# Patient Record
Sex: Male | Born: 1965
Health system: Southern US, Community
[De-identification: ages and names within clinical notes are randomized; demographics above are authoritative.]

## PROBLEM LIST (undated history)

## (undated) DIAGNOSIS — I10 Essential (primary) hypertension: Secondary | ICD-10-CM

## (undated) DIAGNOSIS — Z9989 Dependence on other enabling machines and devices: Secondary | ICD-10-CM

## (undated) DIAGNOSIS — K219 Gastro-esophageal reflux disease without esophagitis: Secondary | ICD-10-CM

## (undated) DIAGNOSIS — G4733 Obstructive sleep apnea (adult) (pediatric): Secondary | ICD-10-CM

## (undated) DIAGNOSIS — I2510 Atherosclerotic heart disease of native coronary artery without angina pectoris: Secondary | ICD-10-CM

## (undated) DIAGNOSIS — E782 Mixed hyperlipidemia: Secondary | ICD-10-CM

## (undated) DIAGNOSIS — K449 Diaphragmatic hernia without obstruction or gangrene: Secondary | ICD-10-CM

## (undated) DIAGNOSIS — R350 Frequency of micturition: Secondary | ICD-10-CM

## (undated) HISTORY — DX: Dependence on other enabling machines and devices: Z99.89

## (undated) HISTORY — PX: CIRCUMCISION: SHX1350

## (undated) HISTORY — DX: Mixed hyperlipidemia: E78.2

## (undated) HISTORY — DX: Gastro-esophageal reflux disease without esophagitis: K44.9

## (undated) HISTORY — DX: Obstructive sleep apnea (adult) (pediatric): G47.33

## (undated) HISTORY — DX: Gastro-esophageal reflux disease without esophagitis: K21.9

## (undated) HISTORY — DX: Morbid (severe) obesity due to excess calories: E66.01

---

## 2008-08-16 ENCOUNTER — Ambulatory Visit (HOSPITAL_COMMUNITY): Admission: RE | Admit: 2008-08-16 | Discharge: 2008-08-16 | Payer: Self-pay | Admitting: Internal Medicine

## 2010-06-22 ENCOUNTER — Encounter: Payer: Self-pay | Admitting: Internal Medicine

## 2011-06-12 ENCOUNTER — Encounter (INDEPENDENT_AMBULATORY_CARE_PROVIDER_SITE_OTHER): Payer: Self-pay | Admitting: *Deleted

## 2011-06-24 ENCOUNTER — Encounter (INDEPENDENT_AMBULATORY_CARE_PROVIDER_SITE_OTHER): Payer: Self-pay | Admitting: *Deleted

## 2011-06-24 ENCOUNTER — Ambulatory Visit (INDEPENDENT_AMBULATORY_CARE_PROVIDER_SITE_OTHER): Payer: BC Managed Care – PPO | Admitting: Internal Medicine

## 2011-06-24 ENCOUNTER — Encounter (INDEPENDENT_AMBULATORY_CARE_PROVIDER_SITE_OTHER): Payer: Self-pay | Admitting: Internal Medicine

## 2011-06-24 ENCOUNTER — Other Ambulatory Visit (INDEPENDENT_AMBULATORY_CARE_PROVIDER_SITE_OTHER): Payer: Self-pay | Admitting: *Deleted

## 2011-06-24 DIAGNOSIS — E1159 Type 2 diabetes mellitus with other circulatory complications: Secondary | ICD-10-CM

## 2011-06-24 DIAGNOSIS — E119 Type 2 diabetes mellitus without complications: Secondary | ICD-10-CM

## 2011-06-24 DIAGNOSIS — R1310 Dysphagia, unspecified: Secondary | ICD-10-CM

## 2011-06-24 HISTORY — DX: Type 2 diabetes mellitus with other circulatory complications: E11.59

## 2011-06-24 HISTORY — DX: Dysphagia, unspecified: R13.10

## 2011-06-24 NOTE — Patient Instructions (Signed)
Will schedule an EGD/ED with DR. [name]

## 2011-06-24 NOTE — Progress Notes (Signed)
Subjective:     [name], male   DOB: September 11, 1965, 46 y.o.   MRN: [id number]  HPI  Joel Ward is a 46 yr old male referred to our office for dysphagia. He tells me foods are lodging in his lower esophagus.  He has to drink a lot of water for the bolus to go down.  Any dry meat will lodge in his esophagus.  Symptoms x 4 years. He had an EGD/ED 12/18/2010  by Dr. 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 Small sliding hiatal hernia. [name] He is presently taking Omeprazole for acid reflux.  He says that since he started the Omeprazole his acid reflux is controlled. Appetite good. No weight loss. No abdominal pain. He has a BM about twice a day. No melena or bright red rectal bleeding. He was recently diagnosed with DM2 2 weeks ago and started on Metformin.  With starting the Metformin he had diarrhea but this is better now.    08/16/2008 DG Esophagus  IMPRESSION:  Small sliding type hiatal hernia.  Mild smooth circumferential narrowing just above the hiatal hernia.  Mild spontaneous gastroesophageal reflux to the level of the lower  thoracic esophagus.      Review of Systems see hpi Current Outpatient Prescriptions  Medication Sig Dispense Refill  . metFORMIN (GLUCOPHAGE) 500 MG tablet Take 500 mg by mouth 2 (two) times daily with a meal.      . omeprazole (PRILOSEC) 40 MG capsule Take 40 mg by mouth daily.       Past Medical History  Diagnosis Date  . Diabetes mellitus     Type 2 diagnosed 2 weeks ago   Past Surgical History  Procedure Date  . Circumcision     1988   History   Social History  . Marital Status: Married    Spouse Name: N/A    Number of Children: N/A  . Years of Education: N/A   Occupational History  . Not on file.   Social History Main Topics  . Smoking status: Never Smoker   . Smokeless tobacco: Not on file  . Alcohol Use: No  . Drug Use: No    . Sexually Active: Not on file   Other Topics Concern  . Not on file   Social History Narrative  . No narrative on file   Family Status  Relation Status Death Age  . Mother Deceased     Multiple organ failure  . Father Deceased     old age, natural causes  . Sister Other     One deceased crib death. Three alive: One has  Kidney problems, All have HTN. Two have fibromyalgia  . Brother Other     One died from sleep apnea, One died: ran over  by a policie car.  Two alive in good health.    No Known Allergies     Objective:   Physical Exam  Filed Vitals:   06/24/11 1016  Height: 6\' 1"  (1.854 m)  Weight: 301 lb 12.8 oz (136.896 kg)    Alert and oriented. Skin warm and dry. Oral mucosa is moist.   . Sclera anicteric, conjunctivae is pink. Thyroid not enlarged. No cervical lymphadenopathy. Lungs clear. Heart regular rate and rhythm.  Abdomen is soft. Bowel sounds are positive. No hepatomegaly. No abdominal masses felt. No tenderness. Obese  No edema to lower extremities. Patient is  alert and oriented.      Assessment:    Dysphagia to solids. Symptoms for 4 yrs. Prior hx of dysphagia with EGD 2 yrs ago.    Plan:     EGD/ED.  The risks and benefits such as perforation, bleeding, and infection were reviewed with the patient and is agreeable.

## 2011-06-26 ENCOUNTER — Encounter (HOSPITAL_COMMUNITY): Payer: Self-pay | Admitting: Pharmacy Technician

## 2011-07-02 ENCOUNTER — Encounter (HOSPITAL_COMMUNITY)
Admission: RE | Disposition: A | Discharge status: Home / Self Care | Payer: Self-pay | Source: Ambulatory Visit | Attending: Internal Medicine

## 2011-07-02 ENCOUNTER — Encounter (HOSPITAL_COMMUNITY): Payer: Self-pay | Admitting: *Deleted

## 2011-07-02 ENCOUNTER — Ambulatory Visit (HOSPITAL_COMMUNITY)
Admission: RE | Admit: 2011-07-02 | Discharge: 2011-07-02 | Disposition: A | Discharge status: Home / Self Care | Payer: BC Managed Care – PPO | Source: Ambulatory Visit | Attending: Internal Medicine | Admitting: Internal Medicine

## 2011-07-02 DIAGNOSIS — E119 Type 2 diabetes mellitus without complications: Secondary | ICD-10-CM | POA: Insufficient documentation

## 2011-07-02 DIAGNOSIS — K219 Gastro-esophageal reflux disease without esophagitis: Secondary | ICD-10-CM

## 2011-07-02 DIAGNOSIS — K222 Esophageal obstruction: Secondary | ICD-10-CM

## 2011-07-02 DIAGNOSIS — R1310 Dysphagia, unspecified: Secondary | ICD-10-CM

## 2011-07-02 DIAGNOSIS — K449 Diaphragmatic hernia without obstruction or gangrene: Secondary | ICD-10-CM

## 2011-07-02 LAB — GLUCOSE, CAPILLARY: Glucose-Capillary: 94 mg/dL (ref 70–99)

## 2011-07-02 SURGERY — ESOPHAGOGASTRODUODENOSCOPY (EGD) WITH ESOPHAGEAL DILATION
Anesthesia: Moderate Sedation

## 2011-07-02 MED ORDER — MEPERIDINE HCL 25 MG/ML IJ SOLN
INTRAMUSCULAR | Status: DC | PRN
  Administered 2011-07-02 (×2): 25 mg via INTRAVENOUS

## 2011-07-02 MED ORDER — BUTAMBEN-TETRACAINE-BENZOCAINE 2-2-14 % EX AERO
INHALATION_SPRAY | CUTANEOUS | Status: DC | PRN
Start: 2011-07-02 — End: 2011-07-02
  Administered 2011-07-02: 2 via TOPICAL

## 2011-07-02 MED ORDER — SIMETHICONE 40 MG/0.6ML PO SUSP
ORAL | Status: DC | PRN
  Administered 2011-07-02: 16:00:00

## 2011-07-02 MED ORDER — MEPERIDINE HCL 50 MG/ML IJ SOLN
INTRAMUSCULAR | Status: AC
  Filled 2011-07-02: qty 1

## 2011-07-02 MED ORDER — MIDAZOLAM HCL 5 MG/5ML IJ SOLN
INTRAMUSCULAR | Status: DC | PRN
  Administered 2011-07-02 (×3): 2 mg via INTRAVENOUS

## 2011-07-02 MED ORDER — MIDAZOLAM HCL 5 MG/5ML IJ SOLN
INTRAMUSCULAR | Status: AC
  Filled 2011-07-02: qty 10

## 2011-07-02 NOTE — H&P (Signed)
This is an update to history and physical from 06/24/2011. Patient has chronic GERD symptoms are controlled with PPI was not experiencing solid food dysphagia. He is undergoing diagnostic/therapeutic EGD

## 2011-07-02 NOTE — Op Note (Signed)
EGD PROCEDURE REPORT  PATIENT:  Joel Ward  MR#:  [id number] Birthdate:  [birth year], 46 y.o., male Endoscopist:  Dr. Malissa Hippo, MD Referred By:  Dr. Kirk Ruths, MD.  Procedure Date: 07/02/2011  Procedure:   EGD with ED.  Indications:  Patient is 46 year old African American male with chronic GERD who presents for solid food dysphagia.            Informed Consent:  Procedure and risks were reviewed with the patient and informed consent obtained.  Medications:  Demerol 50 mg IV Versed 6  mg IV Cetacaine spray topically for oropharyngeal anesthesia  Description of procedure:  The endoscope was introduced through the mouth and advanced to the second portion of the duodenum without difficulty or limitations. The mucosal surfaces were surveyed very carefully during advancement of the scope and upon withdrawal.  Findings:  Esophagus:  Mucosa of the proximal middle and middle third was normal. In the distal segment within 3 cm of the GE junction there were 2 small erosions. High grade Schatzki's ring noted.. GEJ:  41 cm Hiatus:  44 cm Stomach:  Stomach was empty and distended very well with insufflation. Folds in the proximal stomach were normal. Examination mucosa of body, antrum, pyloric channel, angularis, fundus and cardia was normal. Duodenum:  Normal bulbar and post bulbar mucosa.  Therapeutic/Diagnostic Maneuvers Performed:  Schatzki's ring was initially dilated with a balloon from 15-18 mm it could not be disrupted. This ring was then dilated to 20 mm with with a second balloon and disrupted. This ring was further disrupted with 3 quadrant biopsy.  Complications:  None  Impression: Erosive reflux esophagitis. High grade Schatzki's ring which was disrupted with a combination of balloon dilation and focal biopsy. Small sliding hiatal hernia.  Recommendations:  Anti-reflux measures reinforced. Continue omeprazole at 40 mg by mouth every morning. Progress  report in one week.   Delshon Blanchfield U  07/02/2011  4:42 PM  CC: Dr. Kirk Ruths, MD, MD & Dr. Bonnetta Barry ref. provider found

## 2011-11-29 ENCOUNTER — Encounter (HOSPITAL_COMMUNITY): Payer: Self-pay | Admitting: Emergency Medicine

## 2011-11-29 ENCOUNTER — Emergency Department (HOSPITAL_COMMUNITY)
Admission: EM | Admit: 2011-11-29 | Discharge: 2011-11-29 | Disposition: A | Discharge status: Home / Self Care | Payer: BC Managed Care – PPO | Attending: Emergency Medicine | Admitting: Emergency Medicine

## 2011-11-29 DIAGNOSIS — H9209 Otalgia, unspecified ear: Secondary | ICD-10-CM | POA: Insufficient documentation

## 2011-11-29 DIAGNOSIS — R42 Dizziness and giddiness: Secondary | ICD-10-CM | POA: Insufficient documentation

## 2011-11-29 DIAGNOSIS — E119 Type 2 diabetes mellitus without complications: Secondary | ICD-10-CM | POA: Insufficient documentation

## 2011-11-29 DIAGNOSIS — H6090 Unspecified otitis externa, unspecified ear: Secondary | ICD-10-CM

## 2011-11-29 DIAGNOSIS — R51 Headache: Secondary | ICD-10-CM | POA: Insufficient documentation

## 2011-11-29 MED ORDER — HYDROCODONE-ACETAMINOPHEN 5-325 MG PO TABS
2.0000 | ORAL_TABLET | Freq: Once | ORAL | Status: AC
  Administered 2011-11-29: 2 via ORAL
  Filled 2011-11-29: qty 2

## 2011-11-29 MED ORDER — HYDROCODONE-ACETAMINOPHEN 7.5-325 MG PO TABS: 1.0000 | ORAL_TABLET | ORAL | Status: AC | PRN

## 2011-11-29 MED ORDER — PSEUDOEPHEDRINE HCL 60 MG PO TABS
60.0000 mg | ORAL_TABLET | Freq: Once | ORAL | Status: AC
  Administered 2011-11-29: 60 mg via ORAL
  Filled 2011-11-29: qty 1

## 2011-11-29 MED ORDER — PENICILLIN V POTASSIUM 250 MG PO TABS
500.0000 mg | ORAL_TABLET | Freq: Once | ORAL | Status: AC
  Administered 2011-11-29: 500 mg via ORAL
  Filled 2011-11-29: qty 2

## 2011-11-29 MED ORDER — PENICILLIN V POTASSIUM 500 MG PO TABS: 500.0000 mg | ORAL_TABLET | Freq: Four times a day (QID) | ORAL | Status: AC

## 2011-11-29 MED ORDER — NEOMYCIN-POLYMYXIN-HC 3.5-10000-1 OT SOLN
3.0000 [drp] | Freq: Three times a day (TID) | OTIC | Status: DC
  Administered 2011-11-29: 3 [drp] via OTIC
  Filled 2011-11-29: qty 10

## 2011-11-29 MED ORDER — ONDANSETRON HCL 4 MG PO TABS
4.0000 mg | ORAL_TABLET | Freq: Once | ORAL | Status: AC
  Administered 2011-11-29: 4 mg via ORAL
  Filled 2011-11-29: qty 1

## 2011-11-29 NOTE — ED Notes (Signed)
Patient with c/o bilateral ear pain and headache, described as pressure and with decreased hearing in left ear. Patient report dizziness with movement, able to ambulate with steady gait.

## 2011-11-29 NOTE — ED Provider Notes (Signed)
History     CSN: [id number]  Arrival date & time 11/29/11  1032   First MD Initiated Contact with Patient 11/29/11 1124      No chief complaint on file.   (Consider location/radiation/quality/duration/timing/severity/associated sxs/prior treatment) HPI Comments: The pt c/o increasing ear pain and headache. This started about 2 to 3 days ago, and getting worse. No high fever. Some dizziness with movement. No falls. No drainaage from the ears. No previous events similar to this.  The history is provided by the patient.    Past Medical History  Diagnosis Date  . Diabetes mellitus     Type 2 diagnosed 2 weeks ago    Past Surgical History  Procedure Date  . Circumcision     1988    No family history on file.  History  Substance Use Topics  . Smoking status: Never Smoker   . Smokeless tobacco: Not on file  . Alcohol Use: No      Review of Systems  Constitutional: Negative for activity change.       All ROS Neg except as noted in HPI  HENT: Positive for ear pain. Negative for nosebleeds and neck pain.   Eyes: Negative for photophobia and discharge.  Respiratory: Negative for cough, shortness of breath and wheezing.   Cardiovascular: Negative for chest pain and palpitations.  Gastrointestinal: Negative for abdominal pain and blood in stool.  Genitourinary: Negative for dysuria, frequency and hematuria.  Musculoskeletal: Negative for back pain and arthralgias.  Skin: Negative.   Neurological: Positive for dizziness and headaches. Negative for seizures and speech difficulty.  Psychiatric/Behavioral: Negative for hallucinations and confusion.    Allergies  Shellfish allergy and Strawberry  Home Medications   Current Outpatient Rx  Name Route Sig Dispense Refill  . METFORMIN HCL 500 MG PO TABS Oral Take 500 mg by mouth 2 (two) times daily with a meal.    . OMEPRAZOLE 40 MG PO CPDR Oral Take 40 mg by mouth daily.      BP 162/93  Pulse 67  Temp 98.6 F (37 C)  (Oral)  Resp 17  Ht 5\' 11"  (1.803 m)  Wt 302 lb (136.986 kg)  BMI 42.12 kg/m2  SpO2 97%  Physical Exam  Nursing note and vitals reviewed. Constitutional: He is oriented to person, place, and time. He appears well-developed and well-nourished.  Non-toxic appearance.  HENT:  Head: Normocephalic.  Right Ear: Tympanic membrane and external ear normal.  Left Ear: Tympanic membrane and external ear normal.       There is increase redness of the right EAC. TM wnl. There is redness and swelling of the left EAC. TM wnl. Nasal congestion.  Eyes: EOM and lids are normal. Pupils are equal, round, and reactive to light.  Neck: Normal range of motion. Neck supple. Carotid bruit is not present.  Cardiovascular: Normal rate, regular rhythm, normal heart sounds, intact distal pulses and normal pulses.   Pulmonary/Chest: Breath sounds normal. No respiratory distress.  Abdominal: Soft. Bowel sounds are normal. There is no tenderness. There is no guarding.  Musculoskeletal: Normal range of motion.  Lymphadenopathy:       Head (right side): No submandibular adenopathy present.       Head (left side): No submandibular adenopathy present.    He has no cervical adenopathy.  Neurological: He is alert and oriented to person, place, and time. He has normal strength. No cranial nerve deficit or sensory deficit.  Skin: Skin is warm and dry.  Psychiatric: He  has a normal mood and affect. His speech is normal.    ED Course  Procedures (including critical care time)  Labs Reviewed - No data to display No results found.   No diagnosis found.    MDM  I have reviewed nursing notes, vital signs, and all appropriate lab and imaging results for this patient. Pt strongly advised to stop the uses of Q-Tips. Cortisporin drops given. Rx for Norco and Penicillin given. Pt to return if not improving.       Kathie Dike, [place] 12/03/11 1028

## 2011-11-29 NOTE — ED Notes (Signed)
Pt c/o ear fullness, pressure, states that the left ear is worse than the right,

## 2011-11-29 NOTE — Discharge Instructions (Signed)
Please use cortisporin otic drops to both ears three times daily. Use penicillin 2 times daily with food. Tylenol for mild pain. Norco for more severe pain.This medication may cause drowsiness.Otitis Externa Swimmer's ear (otitis externa) is an infection in the outer ear canal. It can be caused by a germ or a fungus. It may be caused by:  Swimming in dirty water.   Water that stays in the ear after swimming.  HOME CARE  Put drops in the ear canal as told by your doctor.   Only take medicine as told by your doctor.  GET HELP RIGHT AWAY IF:   You have a temperature by mouth above 102 F (38.9 C), not controlled by medicine.   There is ear pain after 3 days.  MAKE SURE YOU:   Understand these instructions.   Will watch this condition.   Will get help right away if you are not doing well or get worse.  Document Released: 11/04/2007 Document Revised: 05/07/2011 Document Reviewed: 11/04/2007 [facility] Patient Information 2012 [place], [place].

## 2011-12-03 NOTE — ED Provider Notes (Signed)
Medical screening examination/treatment/procedure(s) were performed by non-physician practitioner and as supervising physician I was immediately available for consultation/collaboration. Devoria Albe, MD, Armando Gang   Ward Givens, MD 12/03/11 [phone]

## 2012-03-15 DIAGNOSIS — S93699A Other sprain of unspecified foot, initial encounter: Secondary | ICD-10-CM

## 2012-03-15 HISTORY — DX: Other sprain of unspecified foot, initial encounter: S93.699A

## 2012-03-25 ENCOUNTER — Other Ambulatory Visit (INDEPENDENT_AMBULATORY_CARE_PROVIDER_SITE_OTHER): Payer: Self-pay | Admitting: *Deleted

## 2012-03-25 ENCOUNTER — Telehealth (INDEPENDENT_AMBULATORY_CARE_PROVIDER_SITE_OTHER): Payer: Self-pay | Admitting: *Deleted

## 2012-03-25 DIAGNOSIS — R1310 Dysphagia, unspecified: Secondary | ICD-10-CM

## 2012-03-25 NOTE — Telephone Encounter (Signed)
Joel Ward said it has been about 1 yr from the last time Joel Ward has his throat stretched. He has started getting sick when swallowing food. They went out to eat last week and Joel Ward had trouble swallowing chicken and today it  was eggs with coffee. Would like to speak with Tammy or Dr. Karilyn Cota. Didn't know if he could get his throat stretched again. The return phone number is [phone].

## 2012-03-25 NOTE — Telephone Encounter (Signed)
PCP/Requesting MD:   Name & DOB: Joel Ward 05-Dec-1965   Procedure: egd/ed  Reason/Indication:  dysphagia  Has patient had this procedure before?  yes  If so, when, by whom and where?  1/13  Is there a family history of colon cancer?    Who?  What age when diagnosed?    Is patient diabetic?   yes      Does patient have prosthetic heart valve?  no  Do you have a pacemaker?  no  Has patient had joint replacement within last 12 months?  no  Is patient on Coumadin, Plavix and/or Aspirin? no  Medications: omeprazole 40 mg daily, metformin 500 mg bid  Allergies: nkda  Medication Adjustment: none  Procedure date & time: 03/29/12 at 730

## 2012-03-25 NOTE — Telephone Encounter (Signed)
Spoke with patient. He is having trouble swallowing. Hx of esophageal stricture. Went to [employer] today and could not force an egg down.   Ann, Please schedule an EGD/ED with Dr. Karilyn Cota.

## 2012-03-25 NOTE — Telephone Encounter (Signed)
agree

## 2012-03-25 NOTE — Telephone Encounter (Signed)
Terri would you please address th is with the patient as you are the only provider on site today.\ Thank You Carlotta Telfair

## 2012-03-28 ENCOUNTER — Encounter (HOSPITAL_COMMUNITY): Payer: Self-pay

## 2012-03-28 MED ORDER — SODIUM CHLORIDE 0.45 % IV SOLN
INTRAVENOUS | Status: DC
  Administered 2012-03-29: 07:00:00 via INTRAVENOUS

## 2012-03-29 ENCOUNTER — Encounter (HOSPITAL_COMMUNITY): Payer: Self-pay | Admitting: *Deleted

## 2012-03-29 ENCOUNTER — Ambulatory Visit (HOSPITAL_COMMUNITY)
Admission: RE | Admit: 2012-03-29 | Discharge: 2012-03-29 | Disposition: A | Discharge status: Home / Self Care | Payer: BC Managed Care – PPO | Source: Ambulatory Visit | Attending: Internal Medicine | Admitting: Internal Medicine

## 2012-03-29 ENCOUNTER — Encounter (HOSPITAL_COMMUNITY)
Admission: RE | Disposition: A | Discharge status: Home / Self Care | Payer: Self-pay | Source: Ambulatory Visit | Attending: Internal Medicine

## 2012-03-29 DIAGNOSIS — K449 Diaphragmatic hernia without obstruction or gangrene: Secondary | ICD-10-CM

## 2012-03-29 DIAGNOSIS — K2960 Other gastritis without bleeding: Secondary | ICD-10-CM

## 2012-03-29 DIAGNOSIS — R12 Heartburn: Secondary | ICD-10-CM

## 2012-03-29 DIAGNOSIS — R1310 Dysphagia, unspecified: Secondary | ICD-10-CM | POA: Insufficient documentation

## 2012-03-29 DIAGNOSIS — Z01812 Encounter for preprocedural laboratory examination: Secondary | ICD-10-CM | POA: Insufficient documentation

## 2012-03-29 DIAGNOSIS — K222 Esophageal obstruction: Secondary | ICD-10-CM

## 2012-03-29 DIAGNOSIS — E119 Type 2 diabetes mellitus without complications: Secondary | ICD-10-CM | POA: Insufficient documentation

## 2012-03-29 DIAGNOSIS — K219 Gastro-esophageal reflux disease without esophagitis: Secondary | ICD-10-CM

## 2012-03-29 DIAGNOSIS — K208 Other esophagitis: Secondary | ICD-10-CM

## 2012-03-29 DIAGNOSIS — K2940 Chronic atrophic gastritis without bleeding: Secondary | ICD-10-CM | POA: Insufficient documentation

## 2012-03-29 HISTORY — DX: Frequency of micturition: R35.0

## 2012-03-29 HISTORY — PX: ESOPHAGOGASTRODUODENOSCOPY (EGD) WITH ESOPHAGEAL DILATION: SHX5812

## 2012-03-29 LAB — GLUCOSE, CAPILLARY
Glucose-Capillary: 291 mg/dL — ABNORMAL HIGH (ref 70–99)
Glucose-Capillary: 318 mg/dL — ABNORMAL HIGH (ref 70–99)

## 2012-03-29 SURGERY — ESOPHAGOGASTRODUODENOSCOPY (EGD) WITH ESOPHAGEAL DILATION
Anesthesia: Moderate Sedation

## 2012-03-29 MED ORDER — MIDAZOLAM HCL 5 MG/5ML IJ SOLN
INTRAMUSCULAR | Status: DC | PRN
  Administered 2012-03-29 (×3): 2 mg via INTRAVENOUS

## 2012-03-29 MED ORDER — MEPERIDINE HCL 50 MG/ML IJ SOLN
INTRAMUSCULAR | Status: AC
  Filled 2012-03-29: qty 1

## 2012-03-29 MED ORDER — PANTOPRAZOLE SODIUM 40 MG PO TBEC
40.0000 mg | DELAYED_RELEASE_TABLET | Freq: Two times a day (BID) | ORAL | Status: DC
Start: 2012-03-29 — End: 2015-01-21

## 2012-03-29 MED ORDER — MEPERIDINE HCL 25 MG/ML IJ SOLN
INTRAMUSCULAR | Status: DC | PRN
  Administered 2012-03-29 (×2): 25 mg via INTRAVENOUS

## 2012-03-29 MED ORDER — STERILE WATER FOR IRRIGATION IR SOLN
Status: DC | PRN
  Administered 2012-03-29: 07:00:00

## 2012-03-29 MED ORDER — BUTAMBEN-TETRACAINE-BENZOCAINE 2-2-14 % EX AERO
INHALATION_SPRAY | CUTANEOUS | Status: DC | PRN
  Administered 2012-03-29: 2 via TOPICAL

## 2012-03-29 MED ORDER — MIDAZOLAM HCL 5 MG/5ML IJ SOLN
INTRAMUSCULAR | Status: AC
  Filled 2012-03-29: qty 10

## 2012-03-29 MED ORDER — INSULIN ASPART 100 UNIT/ML ~~LOC~~ SOLN
4.0000 [IU] | Freq: Once | SUBCUTANEOUS | Status: AC
  Administered 2012-03-29: 4 [IU] via SUBCUTANEOUS
  Filled 2012-03-29: qty 3

## 2012-03-29 NOTE — H&P (Signed)
HALDON CARLEY is an 46 y.o. male.   Chief Complaint: Patient is here for EGD and ED. HPI: Patient is 46 year old African male who has chronic GERD and underwent EGD ED in January 2013. He was noted to have high grade Schatzki's ring and erosive esophagitis. He states the benefit lasted for about 2 months. He is having difficulty with solids. He says he is taking his medication for GERD and it is working well. He denies anorexia weight loss abdominal pain or melena. He was recently begun on metformin  Past Medical History  Diagnosis Date  . Diabetes mellitus     Type 2 diagnosed 2 weeks ago  . Urinary frequency     Past Surgical History  Procedure Date  . Circumcision     1988    History reviewed. No pertinent family history. Social History:  reports that he has never smoked. He does not have any smokeless tobacco history on file. He reports that he does not drink alcohol or use illicit drugs.  Allergies:  Allergies  Allergen Reactions  . Shellfish Allergy Swelling    Turns red  . Strawberry Rash    Medications Prior to Admission  Medication Sig Dispense Refill  . acetaminophen (TYLENOL) 325 MG tablet Take 650 mg by mouth every 6 (six) hours as needed. For pain      . metFORMIN (GLUCOPHAGE) 500 MG tablet Take 500 mg by mouth 2 (two) times daily with a meal.        Results for orders placed during the hospital encounter of 03/29/12 (from the past 48 hour(s))  GLUCOSE, CAPILLARY     Status: Abnormal   Collection Time   03/29/12  7:12 AM      Component Value Range Comment   Glucose-Capillary 291 (*) 70 - 99 mg/dL    No results found.  ROS  Blood pressure 138/87, pulse 87, temperature 97.8 F (36.6 C), temperature source Oral, resp. rate 13, height 5\' 11"  (1.803 m), weight 305 lb (138.347 kg), SpO2 92.00%. Physical Exam  Constitutional:       Patient is well-developed obese African American male in NAD  HENT:  Mouth/Throat: Oropharynx is clear and moist.  Eyes:  Conjunctivae normal are normal. No scleral icterus.  Neck: No thyromegaly present.  Cardiovascular: Normal rate, regular rhythm and normal heart sounds.   No murmur heard. Respiratory: Effort normal and breath sounds normal.  GI: Soft. He exhibits no distension and no mass. There is no tenderness.  Musculoskeletal: He exhibits no edema.  Lymphadenopathy:    He has no cervical adenopathy.  Neurological: He is alert.  Skin: Skin is warm and dry.     Assessment/Plan Solid food dysphagia. History of erosive esophagitis and Schatzki's ring. EGD and ED.  Gazella Anglin U 03/29/2012, 7:39 AM

## 2012-03-29 NOTE — Op Note (Signed)
EGD PROCEDURE REPORT  PATIENT:  Joel Ward  MR#:  [id number] Birthdate:  07-10-1965, 46 y.o., male Endoscopist:  Dr. Malissa Hippo, MD Referred By:  Dr. Kirk Ruths, MD Procedure Date: 03/29/2012  Procedure:   EGD with ED.  Indications:  Patient is 46 year old African male who presents with recurrent solid food dysphagia. He has chronic GERD and he states his heartburn is well controlled with therapy. On his last exam in generally 2013 had 2 esophageal erosions and high grade distal is aphasia ring which was dilated to 20 mm with a balloon and also disrupted with focal biopsy.            Informed Consent:  The risks, benefits, alternatives & imponderables which include, but are not limited to, bleeding, infection, perforation, drug reaction and potential missed lesion have been reviewed.  The potential for biopsy, lesion removal, esophageal dilation, etc. have also been discussed.  Questions have been answered.  All parties agreeable.  Please see history & physical in medical record for more information.  Medications:  Demerol 50 mg IV Versed 6 mg IV Cetacaine spray topically for oropharyngeal anesthesia  Description of procedure:  The endoscope was introduced through the mouth and advanced to the second portion of the duodenum without difficulty or limitations. The mucosal surfaces were surveyed very carefully during advancement of the scope and upon withdrawal.  Findings:  Esophagus:  Mucosa of the proximal and middle third was normal. There were erosions in 2 ulcers involving distal 4-5 cm. Stricture noted at GE junction. GEJ:  39 cm Hiatus:  42 cm Stomach:  Stomach was empty and distended very well with insufflation. Folds in the proximal stomach were normal. Examination of mucosa at body revealed some patchy mucosal edema and erythema at antrum. Pyloric channel was patent. Angularis fundus and cardia were examined by retroflexing the scope and were normal. Duodenum:   Normal bulbar and post bulbar mucosa.  Therapeutic/Diagnostic Maneuvers Performed:   Stricture was dilated using balloon dilator. Balloon dilator was passed with the scope. Guidewire was pushed into gastric lumen. Balloon dilator was positioned across stricture and insufflated to a diameter of 18 mm and subsequently to 19 mm resulting in mucosal disruption. Balloon dilator was withdrawn. I was able to pass the scope across stricture without any resistance.  Complications:  None  Impression: Erosive/ulcerative reflux esophagitis worse than on previous exam of January 2013. Stricture at GE junction was dilated to 19 mm with a balloon. Small sliding heart hernia. Nonerosive gastritis.  Recommendations:  Anti-reflux measures reinforced. Pantoprazole 40 mg by mouth twice a day. H. pylori serology. Office visit in 3 months.   Dashay Giesler U  03/29/2012  8:08 AM  CC: Dr. Kirk Ruths, MD & Dr. Bonnetta Barry ref. provider found

## 2012-03-31 ENCOUNTER — Encounter (HOSPITAL_COMMUNITY): Payer: Self-pay | Admitting: Internal Medicine

## 2012-06-28 ENCOUNTER — Ambulatory Visit (INDEPENDENT_AMBULATORY_CARE_PROVIDER_SITE_OTHER): Payer: BC Managed Care – PPO | Admitting: Internal Medicine

## 2012-08-02 ENCOUNTER — Ambulatory Visit (INDEPENDENT_AMBULATORY_CARE_PROVIDER_SITE_OTHER): Payer: BC Managed Care – PPO | Admitting: Internal Medicine

## 2012-08-08 ENCOUNTER — Ambulatory Visit (INDEPENDENT_AMBULATORY_CARE_PROVIDER_SITE_OTHER): Payer: BC Managed Care – PPO | Admitting: Internal Medicine

## 2012-08-26 DIAGNOSIS — M79673 Pain in unspecified foot: Secondary | ICD-10-CM | POA: Insufficient documentation

## 2014-12-20 ENCOUNTER — Ambulatory Visit (HOSPITAL_COMMUNITY)
Admission: RE | Admit: 2014-12-20 | Discharge: 2014-12-20 | Disposition: A | Discharge status: Home / Self Care | Payer: BLUE CROSS/BLUE SHIELD | Source: Ambulatory Visit | Attending: Physician Assistant | Admitting: Physician Assistant

## 2014-12-20 ENCOUNTER — Other Ambulatory Visit (HOSPITAL_COMMUNITY): Payer: Self-pay | Admitting: Physician Assistant

## 2014-12-20 DIAGNOSIS — M25561 Pain in right knee: Secondary | ICD-10-CM | POA: Diagnosis not present

## 2015-01-17 ENCOUNTER — Encounter (INDEPENDENT_AMBULATORY_CARE_PROVIDER_SITE_OTHER): Payer: Self-pay | Admitting: *Deleted

## 2015-01-21 ENCOUNTER — Encounter (INDEPENDENT_AMBULATORY_CARE_PROVIDER_SITE_OTHER): Payer: Self-pay | Admitting: Internal Medicine

## 2015-01-21 ENCOUNTER — Encounter (INDEPENDENT_AMBULATORY_CARE_PROVIDER_SITE_OTHER): Payer: Self-pay | Admitting: *Deleted

## 2015-01-21 ENCOUNTER — Other Ambulatory Visit (INDEPENDENT_AMBULATORY_CARE_PROVIDER_SITE_OTHER): Payer: Self-pay | Admitting: *Deleted

## 2015-01-21 ENCOUNTER — Ambulatory Visit (INDEPENDENT_AMBULATORY_CARE_PROVIDER_SITE_OTHER): Payer: BLUE CROSS/BLUE SHIELD | Admitting: Internal Medicine

## 2015-01-21 VITALS — BP 132/68 | HR 64 | Temp 97.4°F | Ht 71.0 in | Wt 282.9 lb

## 2015-01-21 DIAGNOSIS — R1314 Dysphagia, pharyngoesophageal phase: Secondary | ICD-10-CM | POA: Diagnosis not present

## 2015-01-21 DIAGNOSIS — R1310 Dysphagia, unspecified: Secondary | ICD-10-CM

## 2015-01-21 MED ORDER — PANTOPRAZOLE SODIUM 40 MG PO TBEC: 40.0000 mg | DELAYED_RELEASE_TABLET | Freq: Two times a day (BID) | ORAL | Status: DC

## 2015-01-21 NOTE — Progress Notes (Addendum)
   Subjective:    [name], male    DOB: [birth year], 49 y.o.   MRN: [id number]  HPI  Presents today with c/o dysphagia. Symptoms x 2 yrs. Underwent 2 EGDs in 2013 for same. He tells me [place] [employer], beef, bread foods will lodge. He has not had any problems in the past 30 days because he is on a liquid diet to get his BS under control. He has lost 12 pounds in the past 30 days. Appetite is good.   No abdominal pain. No GERD. Not taking a PPI. He usually has BM once a day. No melena or BRRB.     03/29/2012: EGD with ED.  Indications: Patient is 49 year old African male who presents with recurrent solid food dysphagia. He has chronic GERD and he states his heartburn is well controlled with therapy. On his last exam in generally 2013 had 2 esophageal erosions and high grade distal is aphasia ring which was dilated to 20 mm with a balloon and also disrupted with focal biopsy.  Impression: Erosive/ulcerative reflux esophagitis worse than on previous exam of January 2013. Stricture at GE junction was dilated to 19 mm with a balloon. Small sliding heart hernia. Nonerosive gastritis. H. Pylori negative.  Review of Systems Past Medical History  Diagnosis Date  . Diabetes mellitus     Type 2 diagnosed 2 weeks ago  . Urinary frequency   . Morbid obesity   . Mixed hyperlipidemia   . OSA on CPAP   . Hiatal hernia with gastroesophageal reflux     Past Surgical History  Procedure Laterality Date  . Circumcision      1988  . Esophagogastroduodenoscopy (egd) with esophageal dilation  03/29/2012    Procedure: ESOPHAGOGASTRODUODENOSCOPY (EGD) WITH ESOPHAGEAL DILATION;  Surgeon: Malissa Hippo, MD;  Location: [facility];  Service: Endoscopy;  Laterality: N/A;  730    Allergies  Allergen Reactions  . Shellfish Allergy Swelling    Turns red  . Strawberry  Rash    Current Outpatient Prescriptions on File Prior to Visit  Medication Sig Dispense Refill  . acetaminophen (TYLENOL) 325 MG tablet Take 650 mg by mouth every 6 (six) hours as needed. For pain    . linagliptin (TRADJENTA) 5 MG TABS tablet Take 5 mg by mouth daily.    [name] lisinopril (PRINIVIL,ZESTRIL) 2.5 MG tablet Take 2.5 mg by mouth daily.     No current facility-administered medications on file prior to visit.        Objective:   Physical Exam Blood pressure 132/68, pulse 64, temperature 97.4 F (36.3 C), height  (1.803 m), weight 282 lb 14.4 oz (128.323 kg). Alert and oriented. Skin warm and dry. Oral mucosa is moist.   . Sclera anicteric, conjunctivae is pink. Thyroid not enlarged. No cervical lymphadenopathy. Lungs clear. Heart regular rate and rhythm.  Abdomen is soft. Bowel sounds are positive. No hepatomegaly. No abdominal masses felt. No tenderness.  No edema to lower extremities.         Assessment & Plan:  Solid food dysphagia. Stricture needs to be ruled out. EGD/ED. The risks and benefits such as perforation, bleeding, and infection were reviewed with the patient and is agreeable. Rx for Protonix  BID sent to his pharmacy.

## 2015-01-21 NOTE — Patient Instructions (Signed)
EGD/ED. The risks and benefits such as perforation, bleeding, and infection were reviewed with the patient and is agreeable. 

## 2015-01-22 ENCOUNTER — Encounter: Payer: Self-pay | Admitting: Neurology

## 2015-01-22 ENCOUNTER — Ambulatory Visit (INDEPENDENT_AMBULATORY_CARE_PROVIDER_SITE_OTHER): Payer: BLUE CROSS/BLUE SHIELD | Admitting: Neurology

## 2015-01-22 DIAGNOSIS — R0683 Snoring: Secondary | ICD-10-CM | POA: Diagnosis not present

## 2015-01-22 DIAGNOSIS — R351 Nocturia: Secondary | ICD-10-CM

## 2015-01-22 DIAGNOSIS — G4726 Circadian rhythm sleep disorder, shift work type: Secondary | ICD-10-CM

## 2015-01-22 DIAGNOSIS — G4710 Hypersomnia, unspecified: Secondary | ICD-10-CM | POA: Diagnosis not present

## 2015-01-22 DIAGNOSIS — G4730 Sleep apnea, unspecified: Secondary | ICD-10-CM | POA: Diagnosis not present

## 2015-01-22 DIAGNOSIS — Z6836 Body mass index (BMI) 36.0-36.9, adult: Secondary | ICD-10-CM

## 2015-01-22 HISTORY — DX: Circadian rhythm sleep disorder, shift work type: G47.26

## 2015-01-22 HISTORY — DX: Snoring: R06.83

## 2015-01-22 HISTORY — DX: Nocturia: R35.1

## 2015-01-22 MED ORDER — MOMETASONE FUROATE 50 MCG/ACT NA SUSP: 2.0000 | Freq: Every day | NASAL | Status: DC

## 2015-01-22 NOTE — Patient Instructions (Signed)
Place sleep apnea patient instructions here.   Polysomnography (Sleep Studies) Polysomnography (PSG) is a series of tests used for detecting (diagnosing) obstructive sleep apnea and other sleep disorders. The tests measure how some parts of your body are working while you are sleeping. The tests are extensive and expensive. They are done in a sleep lab or hospital, and vary from center to center. Your caregiver may perform other more simple sleep studies and questionnaires before doing more complete and involved testing. Testing may not be covered by insurance. Some of these tests are:  An EEG (Electroencephalogram). This tests your brain waves and stages of sleep.  An EOG (Electrooculogram). This measures the movements of your eyes. It detects periods of REM (rapid eye movement) sleep, which is your dream sleep.  An EKG (Electrocardiogram). This measures your heart rhythm.  EMG (Electromyography). This is a measurement of how the muscles are working in your upper airway and your legs while sleeping.  An oximetry measurement. It measures how much oxygen (air) you are getting while sleeping.  Breathing efforts may be measured. The same test can be interpreted (understood) differently by different caregivers and centers that study sleep.  Studies may be given an apnea/hypopnea index (AHI). This is a number which is found by counting the times of no breathing or under breathing during the night, and relating those numbers to the amount of time spent in bed. When the AHI is greater than 15, the patient is likely to complain of daytime sleepiness. When the AHI is greater than 30, the patient is at increased risk for heart problems and must be followed more closely. Following the AHI also allows you to know how treatment is working. Simple oximetry (tracking the amount of oxygen that is taken in) can be used for screening patients who:  Do not have symptoms (problems) of OSA.  Have a normal Epworth  Sleepiness Scale Score.  Have a low pre-test probability of having OSA.  Have none of the upper airway problems likely to cause apnea.  Oximetry is also used to determine if treatment is effective in patients who showed significant desaturations (not getting enough oxygen) on their home sleep study. One extra measure of safety is to perform additional studies for the person who only snores. This is because no one can predict with absolute certainty who will have OSA. Those who show significant desaturations (not getting enough oxygen) are recommended to have a more detailed sleep study. Document Released: 11/22/2002 Document Revised: 08/10/2011 Document Reviewed: 07/24/2013 [facility] Patient Information 2015 [place], [place]. This information is not intended to replace advice given to you by your health care provider. Make sure you discuss any questions you have with your health care provider.

## 2015-01-22 NOTE — Progress Notes (Signed)
SLEEP MEDICINE CLINIC   Provider:  Melvyn Novas, M D  Referring Provider: Samuella Bruin Primary Care Physician:  Lenise Herald, PA-C  Chief Complaint  Patient presents with  . sleep consult    has had a sleep study 5-10 years ago but doesn't have results, on cpap but hasn't been using it, DME is [employer], rm [address], son    HPI:  Joel Ward is a 49 y.o. male , seen here as a referral  from PA Lenise Herald for a sleep evaluation,  Chief complaint according to patient : I couldn't use my CPAP- prescribed 10 years ago [facility] "   Joel Ward is a right-handed African-American gentleman who presents with a long established diagnosis of obstructive sleep apnea but was at the time not able to tolerate CPAP. Now he presents again with the knowledge of apneas been witnessed of loud snoring and he has in the meantime developed diabetes. His body mass index has increased, as he gained about 20 pounds in the meantime. He is currently treated for hypertension, hyperlipidemia and diabetes. He reports that his wife and his children have all commented on his snoring. 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 He has no daylight exposure since he works at night. 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 His work place is not air conditioned, and he works sometimes in 90 degrees with high humidity.  He returns from work at about 7 AM. He will take a shower and about one hour after arriving at home get to bed; the bedroom as core, quiet and dark. Sleep habits are as follows: He will sleep fragmented, interrupted by 3-4 bathroom breaks and by waking himself up snoring or choking. By about 4 PM he will rise again, he will have about 6 hours of sleep or less. He prefers to sleep on the side and usually when waking up is still in the lateral  position. He is sleeping with up to 6 pillows, keeping his chest elevated.   Sleep medical history and family sleep history: The patient's mother snored loudly and had crescendo snoring. The patient has no history of neck surgery , facial trauma or surgery , he does have a Barrett's esophagus and occasionally has the esophagus stretched. He has perennial rhinitis.  Social history: The patient is a nonsmoker, nondrinker, he takes a lot of 5 hour energy shots ( 2 ). Rarely soda or iced tea or coffee.  Review of Systems: Out of a complete 14 system review, the patient complains of only the following symptoms, and all other reviewed systems are negative. The patient has fallen asleep driving. snoring and apnea being witnessed. Weight gain,   Epworth score 11 , Fatigue severity score 28  , depression score 0.    Social History   Social History  . Marital Status: Married    Spouse Name: N/A  . Number of Children: N/A  . Years of Education: N/A   Occupational History  . Not on file.   Social History Main Topics  . Smoking status: Never Smoker   . Smokeless tobacco: Not on file  . Alcohol Use: No  . Drug Use: No  . Sexual Activity: Not on file   Other Topics Concern  . Not on file   Social History  Narrative   Drinks caffeine 4 times a week.      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    Family History  Problem Relation Age of Onset  . Heart disease Mother   . Kidney failure Mother   . Diabetes Mother   . Diabetes Father     Past Medical History  Diagnosis Date  . Diabetes mellitus     Type 2 diagnosed 2 weeks ago  . Urinary frequency   . Morbid obesity   . Mixed hyperlipidemia   . OSA on CPAP   . Hiatal hernia with gastroesophageal reflux     Past Surgical History  Procedure Laterality Date  . Circumcision      1988  . Esophagogastroduodenoscopy (egd) with esophageal dilation  03/29/2012    Procedure: ESOPHAGOGASTRODUODENOSCOPY (EGD) WITH ESOPHAGEAL DILATION;  Surgeon:  Malissa Hippo, MD;  [facility];  Service: Endoscopy;  Laterality: N/A;  730    Current Outpatient Prescriptions  Medication Sig Dispense Refill  . acetaminophen (TYLENOL) 325 MG tablet Take 650 mg by mouth every 6 (six) hours as needed. For pain    . linagliptin (TRADJENTA) 5 MG TABS tablet Take 5 mg by mouth daily.    [name] lisinopril (PRINIVIL,ZESTRIL) 2.5 MG tablet Take 2.5 mg by mouth daily.    . pantoprazole (PROTONIX) 40 MG tablet Take 1 tablet (40 mg total) by mouth 2 (two) times daily before a meal. 60 tablet 3   No current facility-administered medications for this visit.    Allergies as of 01/22/2015 - Review Complete 01/22/2015  Allergen Reaction Noted  . Shellfish allergy Swelling 11/29/2011  . Strawberry Rash 11/29/2011    Vitals: BP 140/90 mmHg  Pulse 86  Resp 20  Ht 5\' 11"  (1.803 m)  Wt 283 lb (128.368 kg)  BMI 39.49 kg/m2 Last Weight:  Wt Readings from Last 1 Encounters:  01/22/15 283 lb (128.368 kg)   ZOX:WRUE mass index is 39.49 kg/(m^2).     Last Height:   Ht Readings from Last 1 Encounters:  01/22/15 5\' 11"  (1.803 m)    Physical exam:  General: The patient is awake, alert and appears not in acute distress. The patient is well groomed. Head: Normocephalic, atraumatic. Neck is supple. Mallampati  4,  neck circumference:20.25 . Nasal airflow restricted, no septal deviation. , TMJ is not  evident . Retrognathia is seen.  Cardiovascular:  Regular rate and rhythm, without  murmurs or carotid bruit, and without distended neck veins. Respiratory: Lungs are clear to auscultation. Skin:  Without evidence of edema, or rash Trunk: BMI is elevated . The patient's posture is erect .   Neurologic exam : The patient is awake and alert, oriented to place and time.   Memory subjective described as impaired when sleepy . Attention span & concentration ability appears normal.  Speech is fluent,  without dysarthria, dysphonia or aphasia.  Mood and affect are  appropriate.  Cranial nerves: Pupils are equal and briskly reactive to light. Funduscopic exam without evidence of pallor or edema. Extraocular movements in vertical and horizontal planes intact and without nystagmus. Visual fields by finger perimetry are intact. Hearing to finger rub intact. Facial sensation intact to fine touch.Facial motor strength is symmetric and tongue and uvula move midline. Shoulder shrug was symmetrical.   Motor exam: Normal tone. Elevated muscle bulk and symmetric strength in all extremities.  Sensory:  Fine touch, pinprick and vibration were tested in all extremities.  Proprioception tested in the upper extremities was normal.  Coordination: Rapid alternating movements in the fingers/hands was normal.  Finger-to-nose maneuver  normal without evidence of ataxia, dysmetria or tremor.  Gait and station: Patient walks without assistive device and is able unassisted to climb up to the exam table. Strength within normal limits.  Stance is stable and normal.  Toe and hell stand were tested .Tandem gait is unfragmented. Turns with  3 Steps.  Deep tendon reflexes: in the  upper and lower extremities are symmetric and intact. His right knee is swollen .Babinski maneuver response is downgoing.  The patient was advised of the nature of the diagnosed sleep disorder , the treatment options and risks for general a health and wellness arising from not treating the condition.  I spent more than 30 minutes of face to face time with the patient. Greater than 50% of time was spent in counseling and coordination of care. We have discussed the diagnosis and differential and I answered the patient's questions.     Assessment:  After physical and neurologic examination, review of laboratory studies,  Personal review of imaging studies, reports of other /same  Imaging studies ,  Results of polysomnography/ neurophysiology testing and pre-existing records as far as provided in visit., my  assessment is   1) Joel Ward is indeed a patient with a high risk for obstructive sleep apnea. This is defined by his body mass index, his larger neck circumference and his Mallampati. I have not been able to locate his previous sleep study from a decade ago, but I will need to reestablish his current degree of apnea. If his apnea is mild and without associated oxygen desaturation he could be a candidate for a dental device. This would advance the mandibula forward thereby creating space on the back of the throat. These devices work very well for snoring. They do not work well for patients that have oxygen desaturations with apnea. I will order a sleep study for him, by [facility] criteria.  2) the second sleep disorder is indeed his shift work. Most shift workers are not able to sleep 7 or 8 hours in daytime as a word in a normal nocturnal sleep pattern. This leads 2, relative sleep deprivation and also causes excessive daytime sleepiness and fatigue. Shift work also contributes to weight gain, diabetes and hypertension.  3) elevated body mass index. Joel Ward is muscular but he does have an elevated body mass index, and this contributes as a risk factor to his diabetes, hypertension, and obstructive sleep apnea. He works in a physically demanding job and needs to hydrate with electrolytes and water. A modified diet with unsaturated fatty acids and complex carbohydrates could help him to not lose muscle mass but weight.    Plan:  Treatment plan and additional workup :  I ordered a SPLIt night poslysomnography at 3% and AHI 15.  He used to wake up wit headaches, please add Co2.   Rv after Sleep study.    Porfirio Mylar Analeah Brame MD  01/22/2015   CC: Shawnie Dapper, Pa-c [address] [place], [place] [address]

## 2015-02-08 ENCOUNTER — Encounter (HOSPITAL_COMMUNITY)
Admission: RE | Disposition: A | Discharge status: Home / Self Care | Payer: Self-pay | Source: Ambulatory Visit | Attending: Internal Medicine

## 2015-02-08 ENCOUNTER — Encounter (HOSPITAL_COMMUNITY): Payer: Self-pay | Admitting: *Deleted

## 2015-02-08 ENCOUNTER — Ambulatory Visit (HOSPITAL_COMMUNITY)
Admission: RE | Admit: 2015-02-08 | Discharge: 2015-02-08 | Disposition: A | Discharge status: Home / Self Care | Payer: BLUE CROSS/BLUE SHIELD | Source: Ambulatory Visit | Attending: Internal Medicine | Admitting: Internal Medicine

## 2015-02-08 DIAGNOSIS — E119 Type 2 diabetes mellitus without complications: Secondary | ICD-10-CM | POA: Insufficient documentation

## 2015-02-08 DIAGNOSIS — Z79899 Other long term (current) drug therapy: Secondary | ICD-10-CM | POA: Diagnosis not present

## 2015-02-08 DIAGNOSIS — K449 Diaphragmatic hernia without obstruction or gangrene: Secondary | ICD-10-CM | POA: Diagnosis not present

## 2015-02-08 DIAGNOSIS — I10 Essential (primary) hypertension: Secondary | ICD-10-CM | POA: Diagnosis not present

## 2015-02-08 DIAGNOSIS — Z6838 Body mass index (BMI) 38.0-38.9, adult: Secondary | ICD-10-CM | POA: Insufficient documentation

## 2015-02-08 DIAGNOSIS — K2210 Ulcer of esophagus without bleeding: Secondary | ICD-10-CM | POA: Insufficient documentation

## 2015-02-08 DIAGNOSIS — K219 Gastro-esophageal reflux disease without esophagitis: Secondary | ICD-10-CM | POA: Insufficient documentation

## 2015-02-08 DIAGNOSIS — K208 Other esophagitis: Secondary | ICD-10-CM | POA: Diagnosis not present

## 2015-02-08 DIAGNOSIS — K222 Esophageal obstruction: Secondary | ICD-10-CM | POA: Insufficient documentation

## 2015-02-08 DIAGNOSIS — R1314 Dysphagia, pharyngoesophageal phase: Secondary | ICD-10-CM | POA: Diagnosis not present

## 2015-02-08 DIAGNOSIS — E782 Mixed hyperlipidemia: Secondary | ICD-10-CM | POA: Insufficient documentation

## 2015-02-08 DIAGNOSIS — R1310 Dysphagia, unspecified: Secondary | ICD-10-CM | POA: Insufficient documentation

## 2015-02-08 DIAGNOSIS — G4733 Obstructive sleep apnea (adult) (pediatric): Secondary | ICD-10-CM | POA: Insufficient documentation

## 2015-02-08 HISTORY — PX: ESOPHAGOGASTRODUODENOSCOPY: SHX5428

## 2015-02-08 HISTORY — PX: ESOPHAGEAL DILATION: SHX303

## 2015-02-08 HISTORY — DX: Essential (primary) hypertension: I10

## 2015-02-08 LAB — GLUCOSE, CAPILLARY: GLUCOSE-CAPILLARY: 101 mg/dL — AB (ref 65–99)

## 2015-02-08 SURGERY — EGD (ESOPHAGOGASTRODUODENOSCOPY)
Anesthesia: Moderate Sedation

## 2015-02-08 MED ORDER — MIDAZOLAM HCL 5 MG/5ML IJ SOLN
INTRAMUSCULAR | Status: AC
  Filled 2015-02-08: qty 10

## 2015-02-08 MED ORDER — BUTAMBEN-TETRACAINE-BENZOCAINE 2-2-14 % EX AERO
INHALATION_SPRAY | CUTANEOUS | Status: DC | PRN
  Administered 2015-02-08: 1 via TOPICAL

## 2015-02-08 MED ORDER — MIDAZOLAM HCL 5 MG/5ML IJ SOLN
INTRAMUSCULAR | Status: DC | PRN
  Administered 2015-02-08: 3 mg via INTRAVENOUS
  Administered 2015-02-08: 2 mg via INTRAVENOUS

## 2015-02-08 MED ORDER — SODIUM CHLORIDE 0.9 % IV SOLN
INTRAVENOUS | Status: DC
  Administered 2015-02-08: 10:00:00 via INTRAVENOUS

## 2015-02-08 MED ORDER — MEPERIDINE HCL 50 MG/ML IJ SOLN
INTRAMUSCULAR | Status: DC | PRN
  Administered 2015-02-08 (×2): 25 mg via INTRAVENOUS

## 2015-02-08 MED ORDER — MEPERIDINE HCL 50 MG/ML IJ SOLN
INTRAMUSCULAR | Status: AC
  Filled 2015-02-08: qty 1

## 2015-02-08 NOTE — Discharge Instructions (Signed)
Resume usual medications and diet. Remember to chew food thoroughly and eat slowly. No driving for 24 hours OV in 3 months.    Esophagogastroduodenoscopy Care After Refer to this sheet in the next few weeks. These instructions provide you with information on caring for yourself after your procedure. Your caregiver may also give you more specific instructions. Your treatment has been planned according to current medical practices, but problems sometimes occur. Call your caregiver if you have any problems or questions after your procedure.  HOME CARE INSTRUCTIONS  Do not eat or drink anything until the numbing medicine (local anesthetic) has worn off and your gag reflex has returned. You will know that the local anesthetic has worn off when you can swallow comfortably.  Do not drive for 12 hours after the procedure or as directed by your caregiver.  Only take medicines as directed by your caregiver. SEEK MEDICAL CARE IF:   You cannot stop coughing.  You are not urinating at all or less than usual. SEEK IMMEDIATE MEDICAL CARE IF:  You have difficulty swallowing.  You cannot eat or drink.  You have worsening throat or chest pain.  You have dizziness, lightheadedness, or you faint.  You have nausea or vomiting.  You have chills.  You have a fever.  You have severe abdominal pain.  You have black, tarry, or bloody stools. Document Released: 05/04/2012 Document Reviewed: 05/04/2012 [facility] Patient Information 2015 [place], [place]. This information is not intended to replace advice given to you by your health care provider. Make sure you discuss any questions you have with your health care provider.   Peptic Ulcer A peptic ulcer is a sore in the lining of your esophagus (esophageal ulcer), stomach (gastric ulcer), or in the first part of your small intestine (duodenal ulcer). The ulcer causes erosion into the deeper tissue. CAUSES  Normally, the lining of the stomach and the  small intestine protects itself from the acid that digests food. The protective lining can be damaged by:  An infection caused by a bacterium called Helicobacter pylori (H. pylori).  Regular use of nonsteroidal anti-inflammatory drugs (NSAIDs), such as ibuprofen or aspirin.  Smoking tobacco. Other risk factors include being older than 50, drinking alcohol excessively, and having a family history of ulcer disease.  SYMPTOMS   Burning pain or gnawing in the area between the chest and the belly button.  Heartburn.  Nausea and vomiting.  Bloating. The pain can be worse on an empty stomach and at night. If the ulcer results in bleeding, it can cause:  Black, tarry stools.  Vomiting of bright red blood.  Vomiting of coffee-ground-looking materials. DIAGNOSIS  A diagnosis is usually made based upon your history and an exam. Other tests and procedures may be performed to find the cause of the ulcer. Finding a cause will help determine the best treatment. Tests and procedures may include:  Blood tests, stool tests, or breath tests to check for the bacterium H. pylori.  An upper gastrointestinal (GI) series of the esophagus, stomach, and small intestine.  An endoscopy to examine the esophagus, stomach, and small intestine.  A biopsy. TREATMENT  Treatment may include:  Eliminating the cause of the ulcer, such as smoking, NSAIDs, or alcohol.  Medicines to reduce the amount of acid in your digestive tract.  Antibiotic medicines if the ulcer is caused by the H. pylori bacterium.  An upper endoscopy to treat a bleeding ulcer.  Surgery if the bleeding is severe or if the ulcer created  a hole somewhere in the digestive system. HOME CARE INSTRUCTIONS   Avoid tobacco, alcohol, and caffeine. Smoking can increase the acid in the stomach, and continued smoking will impair the healing of ulcers.  Avoid foods and drinks that seem to cause discomfort or aggravate your ulcer.  Only take  medicines as directed by your caregiver. Do not substitute over-the-counter medicines for prescription medicines without talking to your caregiver.  Keep any follow-up appointments and tests as directed. SEEK MEDICAL CARE IF:   Your do not improve within 7 days of starting treatment.  You have ongoing indigestion or heartburn. SEEK IMMEDIATE MEDICAL CARE IF:   You have sudden, sharp, or persistent abdominal pain.  You have bloody or dark black, tarry stools.  You vomit blood or vomit that looks like coffee grounds.  You become light-headed, weak, or feel faint.  You become sweaty or clammy. MAKE SURE YOU:   Understand these instructions.  Will watch your condition.  Will get help right away if you are not doing well or get worse. Document Released: 05/15/2000 Document Revised: 10/02/2013 Document Reviewed: 12/16/2011 [facility] Patient Information 2015 [place], [place]. This information is not intended to replace advice given to you by your health care provider. Make sure you discuss any questions you have with your health care provider.

## 2015-02-08 NOTE — Op Note (Signed)
EGD PROCEDURE REPORT  PATIENT:  Joel Ward  MR#:  [id number] Birthdate:  23-Aug-1965, 49 y.o., male Endoscopist:  Dr. Malissa Hippo, MD  Procedure Date: 02/08/2015  Procedure:   EGD with ED  Indications:  Patient is 49 year old African-American male was chronic GERD complicated by distal esophageal stricture who presents with solid food dysphagia. Patient stopped PPI while back for cost reasons.            Informed Consent:  The risks, benefits, alternatives & imponderables which include, but are not limited to, bleeding, infection, perforation, drug reaction and potential missed lesion have been reviewed.  The potential for biopsy, lesion removal, esophageal dilation, etc. have also been discussed.  Questions have been answered.  All parties agreeable.  Please see history & physical in medical record for more information.  Medications:  Demerol 50 mg IV Versed 5 mg IV Cetacaine spray topically for oropharyngeal anesthesia  Description of procedure:  The endoscope was introduced through the mouth and advanced to the second portion of the duodenum without difficulty or limitations. The mucosal surfaces were surveyed very carefully during advancement of the scope and upon withdrawal.  Findings:  Esophagus:  The cause of the esophagus was normal. 2 ulcers noted at GE junction along with stricture. Scope passed across the stricture without any difficulty. GEJ:  41 cm Hiatus:  44 cm Stomach:  Stomach was empty and distended very well with insufflation. Folds in the proximal stomach were normal. Examination of mucosa at gastric body, antrum, pyloric channel, angularis fundus and cardia was normal. Duodenum:  Normal bulbar and post bulbar mucosa.  Therapeutic/Diagnostic Maneuvers Performed:   Stricture at GE junction was dilated with balloon dilator. Balloon dilator was advanced through the scope and guidewire push in the gastric lumen. Balloon dilator was positioned across the stricture  and insufflated to a diameter of 15 mm but no mucosal disruption noted. The stricture was further dilated to 16.5 and then to 18 mm resulting in disruption at 2two sites.   Complications:  None  EBL: None  Impression: Erosive/ulcerative esophagitis with stricture at GE junction. This stricture was dilated with balloon dilator from 15-18 mm.  Recommendations:  Tablet instructions given. Anti-reflux measures reinforced. Patient advised to stay on pantoprazole 40 mg by mouth twice a day. Office visit in 3 months.  Rashonda Warrior U  02/08/2015  11:53 AM  CC: Dr. Loreta Ave, Sharlet Salina, PA-C & Dr. Bonnetta Barry ref. provider found

## 2015-02-08 NOTE — H&P (Signed)
Joel Ward is an 49 y.o. male.   Chief Complaint: Patient is here for EGD and ED. HPI: This 49 year old African-American male with history of GERD and esophageal stricture who presents with recurrent solid food dysphagia. Last dilation was in October 2013. He states previous dilation help him for a week and swallowing difficulty has been occurring motor more frequently. He is not taking medication for heartburn. He states coughing was too high. States he does not get heartburn very often. He is watching his diet.  Past Medical History  Diagnosis Date  . Diabetes mellitus     Type 2 diagnosed 2 weeks ago  . Urinary frequency   . Morbid obesity   . Mixed hyperlipidemia   . OSA on CPAP   . Hiatal hernia with gastroesophageal reflux   . Hypertension     Past Surgical History  Procedure Laterality Date  . Circumcision      1988  . Esophagogastroduodenoscopy (egd) with esophageal dilation  03/29/2012    Procedure: ESOPHAGOGASTRODUODENOSCOPY (EGD) WITH ESOPHAGEAL DILATION;  Surgeon: Malissa Hippo, MD;  [facility];  Service: Endoscopy;  Laterality: N/A;  730    Family History  Problem Relation Age of Onset  . Heart disease Mother   . Kidney failure Mother   . Diabetes Mother   . Diabetes Father    Social History:  reports that he has never smoked. He does not have any smokeless tobacco history on file. He reports that he does not drink alcohol or use illicit drugs.  Allergies:  Allergies  Allergen Reactions  . Shellfish Allergy Swelling    Turns red  . Strawberry Rash    Medications Prior to Admission  Medication Sig Dispense Refill  . linagliptin (TRADJENTA) 5 MG TABS tablet Take 5 mg by mouth daily.    [name] acetaminophen (TYLENOL) 325 MG tablet Take 650 mg by mouth every 6 (six) hours as needed. For pain    . ibuprofen (ADVIL,MOTRIN) 200 MG tablet Take 200 mg by mouth every 6 (six) hours as needed.    [name] lisinopril (PRINIVIL,ZESTRIL) 2.5 MG tablet Take 2.5 mg  by mouth daily.    . mometasone (NASONEX) 50 MCG/ACT nasal spray Place 2 sprays into the nose daily. 17 g 12  . pantoprazole (PROTONIX) 40 MG tablet Take 1 tablet (40 mg total) by mouth 2 (two) times daily before a meal. 60 tablet 3    Results for orders placed or performed during the hospital encounter of 02/08/15 (from the past 48 hour(s))  Glucose, capillary     Status: Abnormal   Collection Time: 02/08/15 10:22 AM  Result Value Ref Range   Glucose-Capillary 101 (H) 65 - 99 mg/dL   No results found.  ROS  Blood pressure 132/87, pulse 68, temperature 97.9 F (36.6 C), temperature source Oral, resp. rate 18, height  (1.803 m), weight 277 lb (125.646 kg), SpO2 97 %. Physical Exam  Constitutional: He appears well-developed and well-nourished.  HENT:  Mouth/Throat: Oropharynx is clear and moist.  Eyes: Conjunctivae are normal. No scleral icterus.  Neck: No thyromegaly present.  Cardiovascular: Normal rate, regular rhythm and normal heart sounds.   No murmur heard. Respiratory: Effort normal and breath sounds normal.  GI: Soft. He exhibits no distension and no mass. There is no tenderness.  Musculoskeletal: He exhibits no edema.  Lymphadenopathy:    He has no cervical adenopathy.  Neurological: He is alert.  Skin: Skin is warm and dry.  Assessment/Plan Solid food dysphagia. History of GERD and esophageal stricture. EGD with ED.  Joel,NAJEEB Ward 02/08/2015, 11:30 AM

## 2015-02-11 ENCOUNTER — Telehealth (INDEPENDENT_AMBULATORY_CARE_PROVIDER_SITE_OTHER): Payer: Self-pay | Admitting: *Deleted

## 2015-02-11 NOTE — Telephone Encounter (Signed)
Per EGD op note, patient needs OV 3 months

## 2015-02-12 ENCOUNTER — Encounter (HOSPITAL_COMMUNITY): Payer: Self-pay | Admitting: Internal Medicine

## 2015-02-14 NOTE — Telephone Encounter (Signed)
Apt has been scheduled for 05/10/15 with Dorene Ar, NP.

## 2015-03-08 ENCOUNTER — Ambulatory Visit (INDEPENDENT_AMBULATORY_CARE_PROVIDER_SITE_OTHER): Payer: BLUE CROSS/BLUE SHIELD | Admitting: Neurology

## 2015-03-08 DIAGNOSIS — G4733 Obstructive sleep apnea (adult) (pediatric): Secondary | ICD-10-CM

## 2015-03-08 DIAGNOSIS — G4730 Sleep apnea, unspecified: Secondary | ICD-10-CM

## 2015-03-08 DIAGNOSIS — G4726 Circadian rhythm sleep disorder, shift work type: Secondary | ICD-10-CM

## 2015-03-08 DIAGNOSIS — G4710 Hypersomnia, unspecified: Secondary | ICD-10-CM

## 2015-03-08 DIAGNOSIS — R351 Nocturia: Secondary | ICD-10-CM

## 2015-03-08 DIAGNOSIS — R0683 Snoring: Secondary | ICD-10-CM

## 2015-03-08 NOTE — Sleep Study (Signed)
Please see the scanned sleep study interpretation located in the Procedure tab within the Chart Review section. 

## 2015-03-12 ENCOUNTER — Telehealth: Payer: Self-pay

## 2015-03-12 DIAGNOSIS — G4733 Obstructive sleep apnea (adult) (pediatric): Secondary | ICD-10-CM

## 2015-03-12 NOTE — Telephone Encounter (Signed)
Called pt to give his sleep study results. No answer, left a message asking him to call me back. 

## 2015-03-13 NOTE — Telephone Encounter (Signed)
Spoke to pt regarding his sleep study results. I advised him that severe osa was seen in his study with hypoxemia and that cpap treatment is recommended. Pt is willing to proceed. I advised pt that I would send his information to [facility], and they would be contacting him to set up his cpap. Pt verbalized understanding. I advised pt to lose weight, diet and exercise and to avoid driving or operating hazardous machinery when sleepy. Appt made for 12/14 at 9:30 for cpap compliance for insurance purposes. Pt verbalized understanding to arrive 15 minutes early and bring his cpap machine. Will fax copy of sleep study to Lenise HeraldBenjamin Mann, PAC.

## 2015-05-09 ENCOUNTER — Ambulatory Visit (INDEPENDENT_AMBULATORY_CARE_PROVIDER_SITE_OTHER): Payer: BLUE CROSS/BLUE SHIELD | Admitting: Internal Medicine

## 2015-05-10 ENCOUNTER — Ambulatory Visit (INDEPENDENT_AMBULATORY_CARE_PROVIDER_SITE_OTHER): Payer: BLUE CROSS/BLUE SHIELD | Admitting: Internal Medicine

## 2015-05-14 ENCOUNTER — Telehealth: Payer: Self-pay

## 2015-05-14 NOTE — Telephone Encounter (Signed)
Spoke to pt. He has not started his cpap yet. He has an appt on Friday to get it started. His appt will be cancelled for tomorrow. Pt is agreeable to coming in on 07/18/15 at 10:45 for his cpap follow up. Pt verbalized understanding.

## 2015-05-15 ENCOUNTER — Ambulatory Visit: Payer: Self-pay | Admitting: Neurology

## 2015-05-20 ENCOUNTER — Encounter (INDEPENDENT_AMBULATORY_CARE_PROVIDER_SITE_OTHER): Payer: Self-pay | Admitting: *Deleted

## 2015-05-20 ENCOUNTER — Ambulatory Visit (INDEPENDENT_AMBULATORY_CARE_PROVIDER_SITE_OTHER): Payer: BLUE CROSS/BLUE SHIELD | Admitting: Internal Medicine

## 2015-07-18 ENCOUNTER — Ambulatory Visit (INDEPENDENT_AMBULATORY_CARE_PROVIDER_SITE_OTHER): Payer: BLUE CROSS/BLUE SHIELD | Admitting: Neurology

## 2015-07-18 ENCOUNTER — Encounter: Payer: Self-pay | Admitting: Neurology

## 2015-07-18 VITALS — BP 162/92 | HR 82 | Resp 20 | Ht 71.0 in | Wt 296.0 lb

## 2015-07-18 DIAGNOSIS — G4733 Obstructive sleep apnea (adult) (pediatric): Secondary | ICD-10-CM | POA: Diagnosis not present

## 2015-07-18 DIAGNOSIS — Z9989 Dependence on other enabling machines and devices: Principal | ICD-10-CM

## 2015-07-18 NOTE — Patient Instructions (Signed)

## 2015-07-18 NOTE — Progress Notes (Signed)
SLEEP MEDICINE CLINIC   Provider:  Melvyn Novas, M D  Referring Provider: Samuella Bruin Primary Care Physician:  Lenise Herald, PA-C  Chief Complaint  Patient presents with  . Follow-up    cpap going well, rm 11, alone    HPI:  Joel Ward is a 50 y.o. male , seen here as a referral  from PA Lenise Herald for a sleep evaluation,  Chief complaint according to patient : "I couldn't use my CPAP- prescribed 10 years ago after a Sleep study [facility] "   Joel Ward is a right-handed African-American gentleman who presents with a long established diagnosis of obstructive sleep apnea but was at the time not able to tolerate CPAP. Now he presents again with the knowledge of apneas been witnessed of loud snoring and he has in the meantime developed diabetes. His body mass index has increased, as he gained about 20 pounds in the meantime. He is currently treated for hypertension, hyperlipidemia and diabetes. He reports that his wife and his children have all commented on his snoring. 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 He has no daylight exposure since he works at night. 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 His work place is not air conditioned, and he works sometimes in 90 degrees with high humidity.  He returns from work at about 7 AM. He will take a shower and about one hour after arriving at home get to bed; the bedroom as core, quiet and dark. Sleep habits are as follows: He will sleep fragmented, interrupted by 3-4 bathroom breaks and by waking himself up snoring or choking. By about 4 PM he will rise again, he will have about 6 hours of sleep or less. He prefers to sleep on the side and usually when waking up is still in the lateral position. He is sleeping with up to 6 pillows, keeping his chest elevated.  Sleep medical history and family  sleep history: The patient's mother snored loudly and had crescendo snoring. The patient has no history of neck surgery , facial trauma or surgery , he does have a Barrett's esophagus and occasionally has the esophagus stretched. He has perennial rhinitis. Social history: The patient is a nonsmoker, nondrinker, he takes a lot of 5 hour energy shots ( 2 ). Rarely soda or iced tea or coffee.  I see Joel Ward today on 07/18/2015 for a revisit.  He underwent a sleep study on 03-08-15 which converted to a split night protocol. Joel Ward experienced the worst apnea I had seen a long time 97 per hour RDI 98.4, nonsupine AHI of 120/h. Lowest oxygen nadir was 74% with 77 minutes of desaturation he retained CO2 to a level of 56.4 torr, Joel Ward was titrated to CPAP but still had desaturations. 53 minutes of desaturation and total. He seemed to respond best to 15 cm water pressure and for this reason I ordered an auto titrator.  Average user time on days used is only 3 hours and 46 minutes. Compliance is therefore 33.3%. I would like to mention that his AHI was 7.8 down from over 90 when he used to machine. The average device pressure actually is 17.7 cm water. In order to make it easier for him to use CPAP and to comply we will discussed today some strategies that will  increase his comfort level. He is using a full face mask and I like for him to place his tongue to roof of his mouth when he stops breathing after placing the map mask on his face. The patient also reports that he has pressure marks next to the bridge of his nose and it is necessary for his DME to refit him with a masked. The patient has an uncongested patent nasal passage way. I would be happy to prescribe him Ambien or Xanax if he needs medication to start using his CPAP more compliantly. 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    Review of Systems: Out of a complete 14 system review, the patient complains of only the following symptoms,  and all other reviewed systems are negative. The patient has fallen asleep driving. snoring and apnea being witnessed. Weight gain,   Epworth score  9 from 11 , Fatigue severity score  24 from 28  , depression score 0.    Social History   Social History  . Marital Status: Married    Spouse Name: N/A  . Number of Children: N/A  . Years of Education: N/A   Occupational History  . Not on file.   Social History Main Topics  . Smoking status: Never Smoker   . Smokeless tobacco: Not on file  . Alcohol Use: No  . Drug Use: No  . Sexual Activity: Not on file   Other Topics Concern  . Not on file   Social History Narrative   Drinks caffeine 4 times a week.      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    Family History  Problem Relation Age of Onset  . Heart disease Mother   . Kidney failure Mother   . Diabetes Mother   . Diabetes Father     Past Medical History  Diagnosis Date  . Diabetes mellitus     Type 2 diagnosed 2 weeks ago  . Urinary frequency   . Morbid obesity (HCC)   . Mixed hyperlipidemia   . OSA on CPAP   . Hiatal hernia with gastroesophageal reflux   . Hypertension     Past Surgical History  Procedure Laterality Date  . Circumcision      1988  . Esophagogastroduodenoscopy (egd) with esophageal dilation  03/29/2012    Procedure: ESOPHAGOGASTRODUODENOSCOPY (EGD) WITH ESOPHAGEAL DILATION;  Surgeon: Malissa Hippo, MD;  [facility];  Service: Endoscopy;  Laterality: N/A;  730  . Esophagogastroduodenoscopy N/A 02/08/2015    Procedure: ESOPHAGOGASTRODUODENOSCOPY (EGD);  Surgeon: Malissa Hippo, MD;  [facility];  Service: Endoscopy;  Laterality: N/A;  1055  . Esophageal dilation N/A 02/08/2015    Procedure: ESOPHAGEAL DILATION;  Surgeon: Malissa Hippo, MD;  [facility];  Service: Endoscopy;  Laterality: N/A;    Current Outpatient Prescriptions  Medication Sig Dispense Refill  . acetaminophen (TYLENOL) 325 MG tablet Take 650 mg by  mouth every 6 (six) hours as needed. For pain    . ibuprofen (ADVIL,MOTRIN) 200 MG tablet Take 200 mg by mouth every 6 (six) hours as needed.    . linagliptin (TRADJENTA) 5 MG TABS tablet Take 5 mg by mouth daily.    [name] lisinopril (PRINIVIL,ZESTRIL) 2.5 MG tablet Take 2.5 mg by mouth daily.    . mometasone (NASONEX) 50 MCG/ACT nasal spray Place 2 sprays into the nose daily. 17 g 12  . pantoprazole (PROTONIX) 40 MG tablet Take 1 tablet (40 mg total)  by mouth 2 (two) times daily before a meal. 60 tablet 3   No current facility-administered medications for this visit.    Allergies as of 07/18/2015 - Review Complete 07/18/2015  Allergen Reaction Noted  . Shellfish allergy Swelling 11/29/2011  . Strawberry extract Rash 11/29/2011    Vitals: BP 162/92 mmHg  Pulse 82  Resp 20  Ht 5\' 11"  (1.803 m)  Wt 296 lb (134.265 kg)  BMI 41.30 kg/m2 Last Weight:  Wt Readings from Last 1 Encounters:  07/18/15 296 lb (134.265 kg)   ZOX:WRUE mass index is 41.3 kg/(m^2).     Last Height:   Ht Readings from Last 1 Encounters:  07/18/15 5\' 11"  (1.803 m)    Physical exam:  General: The patient is awake, alert and appears not in acute distress. The patient is well groomed. Head: Normocephalic, atraumatic. Neck is supple. Mallampati  4,  neck circumference:20.25 . Nasal airflow restricted, no septal deviation. , TMJ is not  evident . Retrognathia is seen.  Cardiovascular:  Regular rate and rhythm, without  murmurs or carotid bruit, and without distended neck veins. Respiratory: Lungs are clear to auscultation. Skin:  Without evidence of edema, or rash Trunk: BMI is elevated . The patient's posture is erect .   Neurologic exam : The patient is awake and alert, oriented to place and time.   Memory subjective described as impaired when sleepy . Attention span & concentration ability appears normal.  Speech is fluent,  without dysarthria, dysphonia or aphasia.  Mood and affect are appropriate.  Cranial  nerves: Pupils are equal and briskly reactive to light. Funduscopic exam without evidence of pallor or edema. Extraocular movements in vertical and horizontal planes intact and without nystagmus. Visual fields by finger perimetry are intact. Hearing to finger rub intact. Facial sensation intact to fine touch.Facial motor strength is symmetric and tongue and uvula move midline. Shoulder shrug was symmetrical.   Motor exam: Normal tone. Elevated muscle bulk and symmetric strength in all extremities.    The patient was advised of the nature of the diagnosed sleep disorder , the treatment options and risks for general a health and wellness arising from not treating the condition.  I spent more than 25 minutes of face to face time with the patient. Greater than 50% of time was spent in counseling and coordination of care. We have discussed the diagnosis and differential and I answered the patient's questions.     Assessment:  After physical and neurologic examination, review of laboratory studies,  Personal review of imaging studies, reports of other /same  Imaging studies ,  Results of polysomnography/ neurophysiology testing and pre-existing records as far as provided in visit., my assessment is   1) Joel Ward is indeed a patient with severe  obstructive sleep apnea. He needs a high CPAP pressure to overcome the apnea. He has not longer nocturia more than 3 times nightly when using CPAP.   2) the second sleep disorder is indeed his shift work. This has an impact on his compliance. He sleeps 6 hours at the most.  Most shift workers are not able to sleep 7 or 8 hours in daytime as a word in a normal nocturnal sleep pattern. This leads to a relative sleep deprivation and also causes excessive daytime sleepiness and fatigue. Shift work also contributes to weight gain, diabetes and hypertension.  3) elevated body mass index. Joel Ward is muscular but he does have an elevated body mass index, and this  contributes as  a risk factor to his diabetes, hypertension, and obstructive sleep apnea.  He works in a physically demanding job and needs to hydrate with electrolytes and water. A modified diet with unsaturated fatty acids and complex carbohydrates could help him to not lose muscle mass but weight.    Plan:  Treatment plan and additional workup :  Long discussion of compliance requirements for CPAP.  Nocturia benefited, he has a reduced residual AHI of 7.8  From over 90.   Patient will try to use 5 hours every day. He is to see my NP in 90 days for a revisit.  DME to get him refitted for a comfortable mask, he prefers a [employer].    Porfirio Mylar Krystyna Cleckley MD  07/18/2015   CC: Shawnie Dapper, Pa-c [address] [place], [place] [address]

## 2015-08-15 DIAGNOSIS — G4733 Obstructive sleep apnea (adult) (pediatric): Secondary | ICD-10-CM | POA: Diagnosis not present

## 2015-09-15 DIAGNOSIS — G4733 Obstructive sleep apnea (adult) (pediatric): Secondary | ICD-10-CM | POA: Diagnosis not present

## 2015-10-15 ENCOUNTER — Ambulatory Visit (INDEPENDENT_AMBULATORY_CARE_PROVIDER_SITE_OTHER): Payer: BLUE CROSS/BLUE SHIELD | Admitting: Adult Health

## 2015-10-15 ENCOUNTER — Encounter: Payer: Self-pay | Admitting: Adult Health

## 2015-10-15 VITALS — BP 137/88 | HR 70 | Ht 71.0 in | Wt 298.0 lb

## 2015-10-15 DIAGNOSIS — Z9989 Dependence on other enabling machines and devices: Principal | ICD-10-CM

## 2015-10-15 DIAGNOSIS — G4733 Obstructive sleep apnea (adult) (pediatric): Secondary | ICD-10-CM

## 2015-10-15 NOTE — Patient Instructions (Signed)
Mask refitting Put the machine back on if you wake up at night CPAP nightly If your symptoms worsen or you develop new symptoms please let us know.

## 2015-10-15 NOTE — Progress Notes (Signed)
PATIENT: Joel Ward DOB: 1966/01/16  REASON FOR VISIT: follow up- OSA on CPAP HISTORY FROM: patient  HISTORY OF PRESENT ILLNESS:  Today 10/15/2015: Joel Ward is a 50 year old male with a history of obstructive sleep apnea on CPAP. He returns today for follow-up. His CPAP compliance download indicates that he uses machine 23 out of 30 days for compliance of 76.7%. He uses machine greater than 4 hours for a percentage of 23.3%. On average he uses his machine 3 hours and 24 minutes. His residual AHI is 4 with an average pressure of 18 cm of water. The patient states that he typically wakes up during the night due to his legs moving. He states that he will take off his mask but has not been putting the mask back on because he thought this was counted as an additional day. The patient states that his mask was also never refitted. He states that it continues to press on the nasal bridge. His fatigue severity score is 2 and Epworth sleepiness score is 15. He returns today for an evaluation.   HISTORY (DOHMEIER): Joel Ward is a 50 y.o. male , seen here as a referral from PA Lenise Herald for a sleep evaluation,  Chief complaint according to patient : "I couldn't use my CPAP- prescribed 10 years ago after a Sleep study [facility] "   Mr. Stallbaumer is a right-handed African-American gentleman who presents with a long established diagnosis of obstructive sleep apnea but was at the time not able to tolerate CPAP. Now he presents again with the knowledge of apneas been witnessed of loud snoring and he has in the meantime developed diabetes. His body mass index has increased, as he gained about 20 pounds in the meantime. He is currently treated for hypertension, hyperlipidemia and diabetes. He reports that his wife and his children have all commented on his snoring. 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 He has  no daylight exposure since he works at night. 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 His work place is not air conditioned, and he works sometimes in 90 degrees with high humidity.  He returns from work at about 7 AM. He will take a shower and about one hour after arriving at home get to bed; the bedroom as core, quiet and dark. Sleep habits are as follows: He will sleep fragmented, interrupted by 3-4 bathroom breaks and by waking himself up snoring or choking. By about 4 PM he will rise again, he will have about 6 hours of sleep or less. He prefers to sleep on the side and usually when waking up is still in the lateral position. He is sleeping with up to 6 pillows, keeping his chest elevated.  Sleep medical history and family sleep history: The patient's mother snored loudly and had crescendo snoring. The patient has no history of neck surgery , facial trauma or surgery , he does have a Barrett's esophagus and occasionally has the esophagus stretched. He has perennial rhinitis. Social history: The patient is a nonsmoker, nondrinker, he takes a lot of 5 hour energy shots ( 2 ). Rarely soda or iced tea or coffee.  I see [name] today on 07/18/2015 for a revisit.  He underwent a sleep study on 03-08-15 which converted to a split night protocol. Mr. peel experienced the  worst apnea I had seen a long time 97 per hour RDI 98.4, nonsupine AHI of 120/h. Lowest oxygen nadir was 74% with 77 minutes of desaturation he retained CO2 to a level of 56.4 torr, [name] was titrated to CPAP but still had desaturations. 53 minutes of desaturation and total. He seemed to respond best to 15 cm water pressure and for this reason I ordered an auto titrator.  Average user time on days used is only 3 hours and 46 minutes. Compliance is therefore 33.3%. I would like to mention that his AHI was 7.8 down from over 90 when he used to machine. The average device pressure  actually is 17.7 cm water. In order to make it easier for him to use CPAP and to comply we will discussed today some strategies that will increase his comfort level. He is using a full face mask and I like for him to place his tongue to roof of his mouth when he stops breathing after placing the map mask on his face. The patient also reports that he has pressure marks next to the bridge of his nose and it is necessary for his DME to refit him with a masked. The patient has an uncongested patent nasal passage way. I would be happy to prescribe him Ambien or Xanax if he needs medication to start using his CPAP more compliantly. 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   REVIEW OF SYSTEMS: Out of a complete 14 system review of symptoms, the patient complains only of the following symptoms, and all other reviewed systems are negative.  See history of present illness  ALLERGIES: Allergies  Allergen Reactions  . Shellfish Allergy Swelling    Turns red  . Strawberry Extract Rash    HOME MEDICATIONS: Outpatient Prescriptions Prior to Visit  Medication Sig Dispense Refill  . acetaminophen (TYLENOL) 325 MG tablet Take 650 mg by mouth every 6 (six) hours as needed. For pain    . ibuprofen (ADVIL,MOTRIN) 200 MG tablet Take 200 mg by mouth every 6 (six) hours as needed.    . linagliptin (TRADJENTA) 5 MG TABS tablet Take 5 mg by mouth daily.    [name] lisinopril (PRINIVIL,ZESTRIL) 2.5 MG tablet Take 2.5 mg by mouth daily.    . mometasone (NASONEX) 50 MCG/ACT nasal spray Place 2 sprays into the nose daily. 17 g 12  . pantoprazole (PROTONIX) 40 MG tablet Take 1 tablet (40 mg total) by mouth 2 (two) times daily before a meal. 60 tablet 3   No facility-administered medications prior to visit.    PAST MEDICAL HISTORY: Past Medical History  Diagnosis Date  . Diabetes mellitus     Type 2 diagnosed 2 weeks ago  . Urinary frequency   . Morbid obesity (HCC)   . Mixed hyperlipidemia   . OSA on CPAP     . Hiatal hernia with gastroesophageal reflux   . Hypertension     PAST SURGICAL HISTORY: Past Surgical History  Procedure Laterality Date  . Circumcision      1988  . Esophagogastroduodenoscopy (egd) with esophageal dilation  03/29/2012    Procedure: ESOPHAGOGASTRODUODENOSCOPY (EGD) WITH ESOPHAGEAL DILATION;  Surgeon: Malissa Hippo, MD;  [facility];  Service: Endoscopy;  Laterality: N/A;  730  . Esophagogastroduodenoscopy N/A 02/08/2015    Procedure: ESOPHAGOGASTRODUODENOSCOPY (EGD);  Surgeon: Malissa Hippo, MD;  [facility];  Service: Endoscopy;  Laterality: N/A;  1055  . Esophageal dilation N/A 02/08/2015  Procedure: ESOPHAGEAL DILATION;  Surgeon: Malissa HippoNajeeb U [name], MD;  [facility];  Service: Endoscopy;  Laterality: N/A;    FAMILY HISTORY: Family History  Problem Relation Age of Onset  . Heart disease Mother   . Kidney failure Mother   . Diabetes Mother   . Diabetes Father     SOCIAL HISTORY: Social History   Social History  . Marital Status: Married    Spouse Name: N/A  . Number of Children: N/A  . Years of Education: N/A   Occupational History  . Not on file.   Social History Main Topics  . Smoking status: Never Smoker   . Smokeless tobacco: Not on file  . Alcohol Use: No  . Drug Use: No  . Sexual Activity: Not on file   Other Topics Concern  . Not on file   Social History Narrative   Drinks caffeine 4 times a week.      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      PHYSICAL EXAM  Filed Vitals:   10/15/15 0910  BP: 137/88  Pulse: 70  Height: 5\' 11"  (1.803 m)  Weight: 298 lb (135.172 kg)   Body mass index is 41.58 kg/(m^2).  Generalized: Well developed, in no acute distress  Neck: Conference 20 inches, Mallampati 4+  Neurological examination  Mentation: Alert oriented to time, place, history taking. Follows all commands speech and language fluent Cranial nerve II-XII: Pupils were equal round reactive to light.  Extraocular movements were full, visual field were full on confrontational test. Facial sensation and strength were normal. Uvula tongue midline. Head turning and shoulder shrug  were normal and symmetric. Motor: The motor testing reveals 5 over 5 strength of all 4 extremities. Good symmetric motor tone is noted throughout.  Sensory: Sensory testing is intact to soft touch on all 4 extremities. No evidence of extinction is noted.  Coordination: Cerebellar testing reveals good finger-nose-finger and heel-to-shin bilaterally.  Gait and station: Gait is normal.  .   DIAGNOSTIC DATA (LABS, IMAGING, TESTING) - I reviewed patient records, labs, notes, testing and imaging myself where available.    ASSESSMENT AND PLAN 50 y.o. year old male  has a past medical history of Diabetes mellitus; Urinary frequency; Morbid obesity (HCC); Mixed hyperlipidemia; OSA on CPAP; Hiatal hernia with gastroesophageal reflux; and Hypertension. here with:  1 . Obstructive sleep apnea on CPAP  Patient is encouraged to use the CPAP greater than 4 hours each night. He's been advised that he he should put the CPAP mask back on if he takes it off during the night. He will meet with our sleep lab manager today to have his mask refitted. I will then send in order to his DME company for new supplies. The patient is advised that if his symptoms worsen or he develops any new symptoms he will follow-up in 6 months or sooner if needed.     Butch PennyMegan Delores Edelstein, MSN, NP-C 10/15/2015, 9:12 AM [facility] [address], [address] [place], [place] [address] [phone]

## 2015-10-15 NOTE — Progress Notes (Signed)
I agree with the assessment and plan as directed by NP .The patient is known to me .   Dimitrius Steedman, MD  

## 2015-11-04 DIAGNOSIS — G4733 Obstructive sleep apnea (adult) (pediatric): Secondary | ICD-10-CM | POA: Diagnosis not present

## 2015-11-15 DIAGNOSIS — G4733 Obstructive sleep apnea (adult) (pediatric): Secondary | ICD-10-CM | POA: Diagnosis not present

## 2015-12-02 DIAGNOSIS — E782 Mixed hyperlipidemia: Secondary | ICD-10-CM | POA: Diagnosis not present

## 2015-12-02 DIAGNOSIS — E1165 Type 2 diabetes mellitus with hyperglycemia: Secondary | ICD-10-CM | POA: Diagnosis not present

## 2015-12-02 DIAGNOSIS — I10 Essential (primary) hypertension: Secondary | ICD-10-CM | POA: Diagnosis not present

## 2015-12-02 DIAGNOSIS — Z1389 Encounter for screening for other disorder: Secondary | ICD-10-CM | POA: Diagnosis not present

## 2015-12-02 DIAGNOSIS — Z6841 Body Mass Index (BMI) 40.0 and over, adult: Secondary | ICD-10-CM | POA: Diagnosis not present

## 2015-12-15 DIAGNOSIS — G4733 Obstructive sleep apnea (adult) (pediatric): Secondary | ICD-10-CM | POA: Diagnosis not present

## 2016-01-15 DIAGNOSIS — G4733 Obstructive sleep apnea (adult) (pediatric): Secondary | ICD-10-CM | POA: Diagnosis not present

## 2016-01-16 ENCOUNTER — Ambulatory Visit: Payer: BLUE CROSS/BLUE SHIELD | Admitting: Adult Health

## 2016-02-07 ENCOUNTER — Emergency Department (HOSPITAL_COMMUNITY): Payer: BLUE CROSS/BLUE SHIELD

## 2016-02-07 ENCOUNTER — Encounter (HOSPITAL_COMMUNITY): Payer: Self-pay | Admitting: Emergency Medicine

## 2016-02-07 ENCOUNTER — Emergency Department (HOSPITAL_COMMUNITY)
Admission: EM | Admit: 2016-02-07 | Discharge: 2016-02-07 | Disposition: A | Discharge status: Home / Self Care | Payer: BLUE CROSS/BLUE SHIELD | Attending: Emergency Medicine | Admitting: Emergency Medicine

## 2016-02-07 DIAGNOSIS — R109 Unspecified abdominal pain: Secondary | ICD-10-CM | POA: Diagnosis not present

## 2016-02-07 DIAGNOSIS — I10 Essential (primary) hypertension: Secondary | ICD-10-CM | POA: Insufficient documentation

## 2016-02-07 DIAGNOSIS — E119 Type 2 diabetes mellitus without complications: Secondary | ICD-10-CM | POA: Insufficient documentation

## 2016-02-07 DIAGNOSIS — J209 Acute bronchitis, unspecified: Secondary | ICD-10-CM | POA: Diagnosis not present

## 2016-02-07 DIAGNOSIS — Z791 Long term (current) use of non-steroidal anti-inflammatories (NSAID): Secondary | ICD-10-CM | POA: Diagnosis not present

## 2016-02-07 DIAGNOSIS — R079 Chest pain, unspecified: Secondary | ICD-10-CM | POA: Diagnosis not present

## 2016-02-07 DIAGNOSIS — Z79899 Other long term (current) drug therapy: Secondary | ICD-10-CM | POA: Diagnosis not present

## 2016-02-07 DIAGNOSIS — R0602 Shortness of breath: Secondary | ICD-10-CM | POA: Diagnosis not present

## 2016-02-07 LAB — CBC
HEMATOCRIT: 40.7 % (ref 39.0–52.0)
HEMOGLOBIN: 13.9 g/dL (ref 13.0–17.0)
MCH: 27.4 pg (ref 26.0–34.0)
MCHC: 34.2 g/dL (ref 30.0–36.0)
MCV: 80.1 fL (ref 78.0–100.0)
Platelets: 330 10*3/uL (ref 150–400)
RBC: 5.08 MIL/uL (ref 4.22–5.81)
RDW: 14.4 % (ref 11.5–15.5)
WBC: 9.7 10*3/uL (ref 4.0–10.5)

## 2016-02-07 LAB — BASIC METABOLIC PANEL
ANION GAP: 3 — AB (ref 5–15)
BUN: 13 mg/dL (ref 6–20)
CHLORIDE: 102 mmol/L (ref 101–111)
CO2: 28 mmol/L (ref 22–32)
Calcium: 8.6 mg/dL — ABNORMAL LOW (ref 8.9–10.3)
Creatinine, Ser: 1.05 mg/dL (ref 0.61–1.24)
GFR calc Af Amer: >60 mL/min (ref >60)
GFR calc non Af Amer: >60 mL/min (ref >60)
GLUCOSE: 213 mg/dL — AB (ref 65–99)
POTASSIUM: 3.7 mmol/L (ref 3.5–5.1)
Sodium: 133 mmol/L — ABNORMAL LOW (ref 135–145)

## 2016-02-07 LAB — TROPONIN I: Troponin I: <0.03 ng/mL (ref <0.03)

## 2016-02-07 MED ORDER — PREDNISONE 50 MG PO TABS
60.0000 mg | ORAL_TABLET | Freq: Once | ORAL | Status: AC
  Administered 2016-02-07: 60 mg via ORAL
  Filled 2016-02-07: qty 1

## 2016-02-07 MED ORDER — IOPAMIDOL (ISOVUE-370) INJECTION 76%
100.0000 mL | Freq: Once | INTRAVENOUS | Status: AC | PRN
  Administered 2016-02-07: 100 mL via INTRAVENOUS

## 2016-02-07 MED ORDER — PREDNISONE 10 MG PO TABS: 20.0000 mg | ORAL_TABLET | Freq: Two times a day (BID) | ORAL | 0 refills | Status: DC

## 2016-02-07 MED ORDER — AEROCHAMBER Z-STAT PLUS/MEDIUM MISC: 1.0000 | Freq: Once | Status: DC

## 2016-02-07 MED ORDER — ALBUTEROL SULFATE HFA 108 (90 BASE) MCG/ACT IN AERS
2.0000 | INHALATION_SPRAY | RESPIRATORY_TRACT | Status: DC | PRN
  Administered 2016-02-07: 2 via RESPIRATORY_TRACT
  Filled 2016-02-07: qty 6.7

## 2016-02-07 NOTE — ED Triage Notes (Signed)
Patient c/o mid-sternal chest pain that started x1 month ago but is progressively getting worse. Per patient radiates at times to back or shoulders, right arm, and jaw. Per patient worse upon exertion. Patient states shortness of breath with exertion. Denies any cardiac hx.

## 2016-02-07 NOTE — Discharge Instructions (Signed)
Using inhaler 2 puffs every 3 or 4 hours for cough or trouble breathing.  Get plenty of rest and drink a lot of fluids.  See your Dr. for further evaluation and treatment.

## 2016-02-07 NOTE — ED Provider Notes (Signed)
[facility] Provider Note   CSN: [id number] Arrival date & time: 02/07/16  1214  By signing my name below, I, Modena JanskyAlbert Thayil, attest that this documentation has been prepared under the direction and in the presence of Mancel BaleElliott Magdalene Tardiff, MD . Electronically Signed: Modena JanskyAlbert Thayil, Scribe. 02/07/2016. 1:06 PM.  History   Chief Complaint Chief Complaint  Patient presents with  . Chest Pain   The history is provided by the patient. No language interpreter was used.   HPI Comments: Joel Ward is a 50 y.o. male who presents to the Emergency Department complaining of intermittent moderate centralized chest pain that started a month ago. Joel Ward describes the worsening pain as radiating to his shoulder blades and present only during exertion. Associated symptoms include SOB (during chest pain episodes), RUE heaviness, and facial pain. Reports current use of metformin and hx of sleep apnea. Denies diaphoresis or vomiting.   Past Medical History:  Diagnosis Date  . Diabetes mellitus    Type 2 diagnosed 2 weeks ago  . Hiatal hernia with gastroesophageal reflux   . Hypertension   . Mixed hyperlipidemia   . Morbid obesity (HCC)   . OSA on CPAP   . Urinary frequency     Patient Active Problem List   Diagnosis Date Noted  . 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 Hypersomnia with sleep apnea 01/22/2015  . Snoring 01/22/2015  . Nocturia more than twice per night 01/22/2015  . Severe obesity (BMI >= 40) (HCC) 01/22/2015  . Diabetes mellitus 06/24/2011  . Dysphagia 06/24/2011    Past Surgical History:  Procedure Laterality Date  . CIRCUMCISION     1988  . ESOPHAGEAL DILATION N/A 02/08/2015   Procedure: ESOPHAGEAL DILATION;  Surgeon: Malissa HippoNajeeb U [name], MD;  Location: [facility];  Service: Endoscopy;  Laterality: N/A;  . ESOPHAGOGASTRODUODENOSCOPY N/A 02/08/2015   Procedure: ESOPHAGOGASTRODUODENOSCOPY (EGD);  Surgeon: Malissa HippoNajeeb U [name], MD;  Location: [facility];  Service: Endoscopy;  Laterality: N/A;  1055  . ESOPHAGOGASTRODUODENOSCOPY (EGD) WITH ESOPHAGEAL DILATION  03/29/2012   Procedure: ESOPHAGOGASTRODUODENOSCOPY (EGD) WITH ESOPHAGEAL DILATION;  Surgeon: Malissa HippoNajeeb U [name], MD;  Location: [facility];  Service: Endoscopy;  Laterality: N/A;  730       Home Medications    Prior to Admission medications   Medication Sig Start Date End Date Taking? Authorizing Provider  acetaminophen (TYLENOL) 325 MG tablet Take 650 mg by mouth every 6 (six) hours as needed. For pain   Yes Historical Provider, MD  esomeprazole (NEXIUM) 20 MG capsule Take 20 mg by mouth daily as needed (acid reflux).    Yes Historical Provider, MD  ibuprofen (ADVIL,MOTRIN) 200 MG tablet Take 400 mg by mouth every 6 (six) hours as needed for moderate pain.    Yes Historical Provider, MD  linagliptin (TRADJENTA) 5 MG TABS tablet Take 5 mg by mouth daily.   Yes Historical Provider, MD  lisinopril (PRINIVIL,ZESTRIL) 2.5 MG tablet Take 2.5 mg by mouth daily.   Yes Historical Provider, MD  naproxen sodium (ANAPROX) 220 MG tablet Take 440 mg by mouth daily as needed (pain).   Yes Historical Provider, MD  predniSONE (DELTASONE) 10 MG tablet Take 2 tablets (20 mg total) by mouth 2 (two) times daily. 02/07/16   Mancel BaleElliott Genise Strack, MD    Family History Family History  Problem Relation Age of Onset  . Heart disease Mother   . Kidney failure Mother   . Diabetes Mother   . Diabetes Father  Social History Social History  Substance Use Topics  . Smoking status: Never Smoker  . Smokeless tobacco: Never Used  . Alcohol use No     Allergies   Shellfish allergy and Strawberry extract   Review of Systems Review of Systems  Constitutional: Negative for diaphoresis.  Cardiovascular: Positive for chest pain.  Gastrointestinal: Negative for vomiting.  All other systems reviewed and are negative.    Physical Exam Updated Vital Signs BP 153/90 (BP Location: Left Arm)    Pulse 81   Temp 98.5 F (36.9 C) (Oral)   Resp 20   Ht 5\' 10"  (1.778 m)   Wt (!) 305 lb (138.3 kg)   SpO2 96%   BMI 43.76 kg/m   Physical Exam  Constitutional: Joel Ward is oriented to person, place, and time. Joel Ward appears well-developed and well-nourished.  Overweight.  HENT:  Head: Normocephalic and atraumatic.  Right Ear: External ear normal.  Left Ear: External ear normal.  Eyes: Conjunctivae and EOM are normal. Pupils are equal, round, and reactive to light.  Neck: Normal range of motion and phonation normal. Neck supple.  Cardiovascular: Normal rate, regular rhythm and normal heart sounds.   Pulmonary/Chest: Effort normal and breath sounds normal. Joel Ward exhibits no bony tenderness.  Abdominal: Soft. There is no tenderness.  Musculoskeletal: Normal range of motion.  Neurological: Joel Ward is alert and oriented to person, place, and time. No cranial nerve deficit or sensory deficit. Joel Ward exhibits normal muscle tone. Coordination normal.  Skin: Skin is warm, dry and intact.  Psychiatric: Joel Ward has a normal mood and affect. His behavior is normal. Judgment and thought content normal.  Nursing note and vitals reviewed.    ED Treatments / Results  DIAGNOSTIC STUDIES: Oxygen Saturation is 96% on RA, normal by my interpretation.    COORDINATION OF CARE: 1:10 PM- Pt advised of plan for treatment and pt agrees.  Labs (all labs ordered are listed, but only abnormal results are displayed) Labs Reviewed  BASIC METABOLIC PANEL - Abnormal; Notable for the following:       Result Value   Sodium 133 (*)    Glucose, Bld 213 (*)    Calcium 8.6 (*)    Anion gap 3 (*)    All other components within normal limits  CBC  TROPONIN I    EKG  EKG Interpretation  Date/Time:  Friday February 07 2016 13:10:17 EDT Ventricular Rate:  81 PR Interval:  162 QRS Duration: 106 QT Interval:  382 QTC Calculation: 444 R Axis:   60 Text Interpretation:  Sinus rhythm Since last tracing of earlier today No  significant change was found Confirmed by Effie Shy  MD, Sarahmarie Leavey [phone]) on 02/07/2016 1:46:20 PM       Radiology Dg Chest 2 View  Result Date: 02/07/2016 CLINICAL DATA:  Retrosternal chest pain radiating to the right shoulder blade with heaviness in the right arm and exertional shortness of breath for the past month. Nonsmoker. History of morbid obesity, hyperlipidemia, diabetes, and gastroesophageal reflux. EXAM: CHEST  2 VIEW COMPARISON:  None in PACs FINDINGS: The lungs are mildly hyperinflated with mild hemidiaphragm flattening. There is no alveolar infiltrate. The interstitial markings are coarse. The heart and pulmonary vascularity are normal. The mediastinum is normal in width. There is calcification in the wall of the aortic arch. There is no pleural effusion or pneumothorax. The observed bony thorax exhibits no acute abnormality. IMPRESSION: Chronic bronchitic changes. No evidence of pneumonia nor CHF nor other acute cardiopulmonary abnormality. Aortic atherosclerosis. Electronically Signed  By: David  [place] M.D.   On: 02/07/2016 12:47   Ct Angio Chest/abd/pel For Dissection W And/or W/wo  Result Date: 02/07/2016 CLINICAL DATA:  Anterior chest pain radiates to the back and right shoulder off and on for 1 month. EXAM: CT ANGIOGRAPHY CHEST, ABDOMEN AND PELVIS TECHNIQUE: Multidetector CT imaging through the chest, abdomen and pelvis was performed using the standard protocol during bolus administration of intravenous contrast. Multiplanar reconstructed images and MIPs were obtained and reviewed to evaluate the vascular anatomy. CONTRAST:  100 cc Isovue 370 COMPARISON:  None. FINDINGS: CTA CHEST FINDINGS Cardiovascular: Heart is borderline enlarged. No pericardial effusion. No aneurysm of the ascending thoracic aorta. Pre contrast imaging shows no hyperdense crescent in the wall of the thoracic aorta to suggest acute intramural hematoma. Postcontrast imaging shows no dissection flap within the thoracic  aorta. The arch vessel anatomy opacifies normally. No evidence for large central pulmonary embolus in either lung. Mediastinum/Nodes: No mediastinal lymphadenopathy. There is no hilar lymphadenopathy. 11 mm left thyroid nodule noted. The esophagus has normal imaging features. Small hiatal hernia noted. There is no axillary lymphadenopathy. Lungs/Pleura: 6 mm subpleural right lower lobe pulmonary nodule visualized image 38 series 11. No focal airspace consolidation. No pulmonary edema or pleural effusion. There is some compressive atelectasis in the dependent lower lobes bilaterally. Musculoskeletal: Bone windows reveal no worrisome lytic or sclerotic osseous lesions. Review of the MIP images confirms the above findings. CTA ABDOMEN AND PELVIS FINDINGS Hepatobiliary: No focal abnormality within the liver parenchyma. There is no evidence for gallstones, gallbladder wall thickening, or pericholecystic fluid. No intrahepatic or extrahepatic biliary dilation. Pancreas: No focal mass lesion. No dilatation of the main duct. No intraparenchymal cyst. No peripancreatic edema. Spleen: No splenomegaly. No focal mass lesion. Adrenals/Urinary Tract: No adrenal nodule or mass. No enhancing lesion is identified in either kidney. There is no hydronephrosis. No evidence for hydroureter. The urinary bladder appears normal for the degree of distention. Stomach/Bowel: Small hiatal hernia noted. Stomach otherwise unremarkable. Duodenum is normally positioned as is the ligament of Treitz. No small bowel wall thickening. No small bowel dilatation. The terminal ileum is normal. The appendix is normal. No gross colonic mass. No colonic wall thickening. No substantial diverticular change. Vascular/Lymphatic: No abdominal aortic aneurysm. No dissection of the abdominal aorta. No atherosclerotic calcification noted in the wall of the abdominal aorta. Celiac axis is widely patent superior mesenteric artery is widely patent. Inferior mesenteric  artery is patent. 12 mm short axis gastrohepatic ligament lymph node is borderline enlarged. No other lymphadenopathy is seen in the abdomen. No pelvic sidewall lymphadenopathy. Reproductive: The prostate gland and seminal vesicles have normal imaging features. Other: No intraperitoneal free fluid. Musculoskeletal: Bone windows reveal no worrisome lytic or sclerotic osseous lesions. Review of the MIP images confirms the above findings. IMPRESSION: 1. No evidence for acute intramural hematoma in the wall of the thoracic aorta. There is no thoracoabdominal aortic dissection or aneurysm. No large central pulmonary embolus. 2. No acute findings in the chest, abdomen, or pelvis. 3. **An incidental finding of potential clinical significance has been found. 6 mm right lower lobe pulmonary nodule. Non-contrast chest CT at 6-12 months is recommended. If the nodule is stable at time of repeat CT, then future CT at 18-24 months (from today's scan) is considered optional for low-risk patients, but is recommended for high-risk patients. This recommendation follows the consensus statement: Guidelines for Management of Incidental Pulmonary Nodules Detected on CT Images:From the Fleischner Society 2017; published online before print (10.1148/radiol.1610960454). **  4. **An incidental finding of potential clinical significance has been found. 11 mm left thyroid nodule. thyroid ultrasound recommended to further evaluate. ** Electronically Signed   By: Kennith Center M.D.   On: 02/07/2016 19:10    Procedures Procedures (including critical care time)  Medications Ordered in ED Medications  albuterol (PROVENTIL HFA;VENTOLIN HFA) 108 (90 Base) MCG/ACT inhaler 2 puff (2 puffs Inhalation Given 02/07/16 1954)  aerochamber Z-Stat Plus/medium 1 each (not administered)  iopamidol (ISOVUE-370) 76 % injection 100 mL (100 mLs Intravenous Contrast Given 02/07/16 1534)  predniSONE (DELTASONE) tablet 60 mg (60 mg Oral Given 02/07/16 1949)      Initial Impression / Assessment and Plan / ED Course  I have reviewed the triage vital signs and the nursing notes.  Pertinent labs & imaging results that were available during my care of the patient were reviewed by me and considered in my medical decision making (see chart for details).  Clinical Course  Value Comment By Time  EKG 12-Lead Normal Mancel Bale, MD 09/08 1303   Medications  albuterol (PROVENTIL HFA;VENTOLIN HFA) 108 (90 Base) MCG/ACT inhaler 2 puff (2 puffs Inhalation Given 02/07/16 1954)  aerochamber Z-Stat Plus/medium 1 each (not administered)  iopamidol (ISOVUE-370) 76 % injection 100 mL (100 mLs Intravenous Contrast Given 02/07/16 1534)  predniSONE (DELTASONE) tablet 60 mg (60 mg Oral Given 02/07/16 1949)    Patient Vitals for the past 24 hrs:  BP Temp Temp src Pulse Resp SpO2 Height Weight  02/07/16 1958 - - - - - 94 % - -  02/07/16 1556 141/88 97.9 F (36.6 C) Oral 82 18 90 % - -  02/07/16 1500 131/92 - - 84 18 93 % - -  02/07/16 1447 141/92 - - 74 17 90 % - -  02/07/16 1400 138/91 - - 87 16 97 % - -  02/07/16 1400 138/91 - - 82 17 (!) 89 % - -  02/07/16 1227 153/90 98.5 F (36.9 C) Oral 81 20 96 % - -  02/07/16 1224 - - - - - - 5\' 10"  (1.778 m) (!) 305 lb (138.3 kg)    At D/C Reevaluation with update and discussion. After initial assessment and treatment, an updated evaluation reveals Joel Ward is more comfortable. Findings discussed with the patient, all questions were answered. Kent Riendeau L    Final Clinical Impressions(s) / ED Diagnoses   Final diagnoses:  Acute bronchitis, unspecified organism    Nonspecific chest discomfort, with dyspnea on exertion. Doubt ACS, PE is congestive heart failure, aortic dissection, or serious bacterial infection. Suspect bronchitis, nonspecific etiology.  Nursing Notes Reviewed/ Care Coordinated Applicable Imaging Reviewed Interpretation of Laboratory Data incorporated into ED treatment  The patient appears  reasonably screened and/or stabilized for discharge and I doubt any other medical condition or other [facility] requiring further screening, evaluation, or treatment in the ED at this time prior to discharge.  Plan: Home Medications- continue; Home Treatments- rest; return here if the recommended treatment, does not improve the symptoms; Recommended follow up- PCP prn   New Prescriptions Discharge Medication List as of 02/07/2016  7:26 PM    START taking these medications   Details  predniSONE (DELTASONE) 10 MG tablet Take 2 tablets (20 mg total) by mouth 2 (two) times daily., Starting Fri 02/07/2016, Print       I personally performed the services described in this documentation, which was scribed in my presence. The recorded information has been reviewed and is accurate.     Mancel Bale,  MD 02/07/16 2158

## 2016-02-07 NOTE — ED Notes (Signed)
Patient transported to CT 

## 2016-02-15 DIAGNOSIS — G4733 Obstructive sleep apnea (adult) (pediatric): Secondary | ICD-10-CM | POA: Diagnosis not present

## 2016-02-24 DIAGNOSIS — E041 Nontoxic single thyroid nodule: Secondary | ICD-10-CM | POA: Diagnosis not present

## 2016-02-24 DIAGNOSIS — Z1389 Encounter for screening for other disorder: Secondary | ICD-10-CM | POA: Diagnosis not present

## 2016-02-24 DIAGNOSIS — Z6841 Body Mass Index (BMI) 40.0 and over, adult: Secondary | ICD-10-CM | POA: Diagnosis not present

## 2016-02-24 DIAGNOSIS — R079 Chest pain, unspecified: Secondary | ICD-10-CM | POA: Diagnosis not present

## 2016-02-24 DIAGNOSIS — J209 Acute bronchitis, unspecified: Secondary | ICD-10-CM | POA: Diagnosis not present

## 2016-02-24 DIAGNOSIS — J984 Other disorders of lung: Secondary | ICD-10-CM | POA: Diagnosis not present

## 2016-03-16 DIAGNOSIS — G4733 Obstructive sleep apnea (adult) (pediatric): Secondary | ICD-10-CM | POA: Diagnosis not present

## 2016-03-18 ENCOUNTER — Ambulatory Visit (INDEPENDENT_AMBULATORY_CARE_PROVIDER_SITE_OTHER): Payer: BLUE CROSS/BLUE SHIELD | Admitting: Pulmonary Disease

## 2016-03-18 ENCOUNTER — Encounter: Payer: Self-pay | Admitting: Pulmonary Disease

## 2016-03-18 VITALS — BP 142/92 | Ht 70.0 in | Wt 309.0 lb

## 2016-03-18 DIAGNOSIS — R911 Solitary pulmonary nodule: Secondary | ICD-10-CM | POA: Diagnosis not present

## 2016-03-18 NOTE — Progress Notes (Signed)
Arline AspFrederick J Lalor    [id number]    07-22-1965  Primary Care Physician:FUSCO,LAWRENCE J., MD  Referring Physician: Shawnie DapperBenjamin L Mann, PA-C [address] [place], [place] [address]  Chief complaint:  Consult for evaluation of pulmonary nodule.  HPI: [name] is a 50 year old with past medical history of sleep apnea, noncompliant with CPAP. He had an episode of bronchitis in September 2017. He had a CT of the chest to evaluate chest pain which did not show any lung abnormalities except for a 6 mm right lung nodule. He was treated with Z-Pak, albuterol, steroids. He feels well currently and denies any cough, sputum production, dyspnea, fevers, chills.  He has no smoking history. 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  Outpatient Encounter Prescriptions as of 03/18/2016  Medication Sig  . acetaminophen (TYLENOL) 325 MG tablet Take 650 mg by mouth every 6 (six) hours as needed. For pain  . albuterol (PROVENTIL HFA;VENTOLIN HFA) 108 (90 Base) MCG/ACT inhaler Inhale into the lungs every 6 (six) hours as needed for wheezing or shortness of breath.  . esomeprazole (NEXIUM) 20 MG capsule Take 20 mg by mouth daily as needed (acid reflux).   [name]. ibuprofen (ADVIL,MOTRIN) 200 MG tablet Take 400 mg by mouth every 6 (six) hours as needed for moderate pain.   [name]. linagliptin (TRADJENTA) 5 MG TABS tablet Take 5 mg by mouth daily.  [name]. lisinopril (PRINIVIL,ZESTRIL) 2.5 MG tablet Take 2.5 mg by mouth daily.  . naproxen sodium (ANAPROX) 220 MG tablet Take 440 mg by mouth daily as needed (pain).  . predniSONE (DELTASONE) 10 MG tablet Take 2 tablets (20 mg total) by mouth 2 (two) times daily.   No facility-administered encounter medications on file as of 03/18/2016.     Allergies as of 03/18/2016 - Review Complete 03/18/2016  Allergen Reaction Noted  . Shellfish allergy Swelling 11/29/2011  . Strawberry extract Rash 11/29/2011    Past Medical  History:  Diagnosis Date  . Diabetes mellitus    Type 2 diagnosed 2 weeks ago  . Hiatal hernia with gastroesophageal reflux   . Hypertension   . Mixed hyperlipidemia   . Morbid obesity (HCC)   . OSA on CPAP   . Urinary frequency     Past Surgical History:  Procedure Laterality Date  . CIRCUMCISION     1988  . ESOPHAGEAL DILATION N/A 02/08/2015   Procedure: ESOPHAGEAL DILATION;  Surgeon: Malissa HippoNajeeb U [name], MD;  [facility];  Service: Endoscopy;  Laterality: N/A;  . ESOPHAGOGASTRODUODENOSCOPY N/A 02/08/2015   Procedure: ESOPHAGOGASTRODUODENOSCOPY (EGD);  Surgeon: Malissa HippoNajeeb U [name], MD;  [facility];  Service: Endoscopy;  Laterality: N/A;  1055  . ESOPHAGOGASTRODUODENOSCOPY (EGD) WITH ESOPHAGEAL DILATION  03/29/2012   Procedure: ESOPHAGOGASTRODUODENOSCOPY (EGD) WITH ESOPHAGEAL DILATION;  Surgeon: Malissa HippoNajeeb U [name], MD;  Location[facility];  Service: Endoscopy;  Laterality: N/A;  730    Family History  Problem Relation Age of Onset  . Heart disease Mother   . Kidney failure Mother   . Diabetes Mother   . Diabetes Father     Social History   Social History  . Marital status: Married    Spouse name: N/A  . Number of children: N/A  . Years of education: N/A   Occupational History  . Not on file.   Social History Main Topics  . Smoking status: Never Smoker  . Smokeless tobacco: Never Used  .  Alcohol use No  . Drug use: No  . Sexual activity: Not on file   Other Topics Concern  . Not on file   Social History Narrative   Drinks caffeine 4 times a week.      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     Review of systems: Review of Systems  Constitutional: Negative for fever and chills.  HENT: Negative.   Eyes: Negative for blurred vision.  Respiratory: as per HPI  Cardiovascular: Negative for chest pain and palpitations.  Gastrointestinal: Negative for vomiting, diarrhea, blood per rectum. Genitourinary: Negative for dysuria, urgency, frequency and  hematuria.  Musculoskeletal: Negative for myalgias, back pain and joint pain.  Skin: Negative for itching and rash.  Neurological: Negative for dizziness, tremors, focal weakness, seizures and loss of consciousness.  Endo/Heme/Allergies: Negative for environmental allergies.  Psychiatric/Behavioral: Negative for depression, suicidal ideas and hallucinations.  All other systems reviewed and are negative.   Physical Exam: Blood pressure (!) 142/92, height 5\' 10"  (1.778 m), weight (!) 309 lb (140.2 kg). Gen:      No acute distress HEENT:  EOMI, sclera anicteric Neck:     No masses; no thyromegaly Lungs:    Clear to auscultation bilaterally; normal respiratory effort CV:         Regular rate and rhythm; no murmurs Abd:      + bowel sounds; soft, non-tender; no palpable masses, no distension Ext:    No edema; adequate peripheral perfusion Skin:      Warm and dry; no rash Neuro: alert and oriented x 3 Psych: normal mood and affect  Data Reviewed: CT 02/07/16-6 millimeter right lower lobe lung nodule Chest x-ray 02/07/16-bronchitis. All images reviewed.  Assessment:  Subcentimeter pulmonary nodule found incidentally during workup for bronchitis. Patient is a nonsmoker and is at low risk for malignancy. We will reevaluate by a follow-up CT in about 8 months time. The results of the CT scan and follow-up was discussed with him.  Plan/Recommendations: - Follow up CT scan  Return to clinic after CT scan  Chilton Greathouse MD [facility] Pager [phone] 03/18/2016, 2:47 PM  CC: Shawnie Dapper, PA-C

## 2016-03-18 NOTE — Patient Instructions (Signed)
Your last CT scan results were discussed with you today. We will order for repeat CT scan without contrast in about 8-9 months time. Return to clinic after the CT scan to discuss further follow up.

## 2016-03-23 ENCOUNTER — Encounter: Payer: Self-pay | Admitting: Endocrinology

## 2016-03-23 ENCOUNTER — Ambulatory Visit (INDEPENDENT_AMBULATORY_CARE_PROVIDER_SITE_OTHER): Payer: BLUE CROSS/BLUE SHIELD | Admitting: Endocrinology

## 2016-03-23 DIAGNOSIS — E041 Nontoxic single thyroid nodule: Secondary | ICD-10-CM | POA: Diagnosis not present

## 2016-03-23 LAB — TSH: TSH: 0.38 u[IU]/mL (ref 0.35–4.50)

## 2016-03-23 NOTE — Patient Instructions (Signed)
blood tests are requested for you today.  We'll let you know about the results. Let's check the ultrasound.  you will receive a phone call, about a day and time for an appointment. If the blood test is normal, and there is just the one nodule, all you need to do is to come back in 1 year.

## 2016-03-23 NOTE — Progress Notes (Signed)
Subjective:    [name], male    DOB: [birth year], 50 y.o.   MRN: [id number]  HPI In sept of 2017, pt had a chest CT.  He was incidentally noted to have a nodule in the thyroid.  He has assoc pain at the ant neck.  He has slight doe.  He is unaware of ever having had thyroid problems in the past.  He has no h/o XRT or surgery to the neck.   Past Medical History:  Diagnosis Date  . Diabetes mellitus    Type 2 diagnosed 2 weeks ago  . Hiatal hernia with gastroesophageal reflux   . Hypertension   . Mixed hyperlipidemia   . Morbid obesity (HCC)   . OSA on CPAP   . Urinary frequency     Past Surgical History:  Procedure Laterality Date  . CIRCUMCISION     1988  . ESOPHAGEAL DILATION N/A 02/08/2015   Procedure: ESOPHAGEAL DILATION;  Surgeon: Malissa HippoNajeeb U [name], MD;  [facility];  Service: Endoscopy;  Laterality: N/A;  . ESOPHAGOGASTRODUODENOSCOPY N/A 02/08/2015   Procedure: ESOPHAGOGASTRODUODENOSCOPY (EGD);  Surgeon: Malissa HippoNajeeb U [name], MD;  [facility];  Service: Endoscopy;  Laterality: N/A;  1055  . ESOPHAGOGASTRODUODENOSCOPY (EGD) WITH ESOPHAGEAL DILATION  03/29/2012   Procedure: ESOPHAGOGASTRODUODENOSCOPY (EGD) WITH ESOPHAGEAL DILATION;  Surgeon: Malissa HippoNajeeb U [name], MD;  [facility];  Service: Endoscopy;  Laterality: N/A;  730    Social History   Social History  . Marital status: Married    Spouse name: N/A  . Number of children: N/A  . Years of education: N/A   Occupational History  . Not on file.   Social History Main Topics  . Smoking status: Never Smoker  . Smokeless tobacco: Never Used  . Alcohol use No  . Drug use: No  . Sexual activity: Not on file   Other Topics Concern  . Not on file   Social History Narrative   Drinks caffeine 4 times a week.      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    Current Outpatient Prescriptions on File Prior to Visit  Medication Sig Dispense Refill  . acetaminophen (TYLENOL) 325 MG tablet Take  650 mg by mouth every 6 (six) hours as needed. For pain    . albuterol (PROVENTIL HFA;VENTOLIN HFA) 108 (90 Base) MCG/ACT inhaler Inhale into the lungs every 6 (six) hours as needed for wheezing or shortness of breath.    . esomeprazole (NEXIUM) 20 MG capsule Take 20 mg by mouth daily as needed (acid reflux).     [name]. ibuprofen (ADVIL,MOTRIN) 200 MG tablet Take 400 mg by mouth every 6 (six) hours as needed for moderate pain.     [name]. linagliptin (TRADJENTA) 5 MG TABS tablet Take 5 mg by mouth daily.    [name]. lisinopril (PRINIVIL,ZESTRIL) 2.5 MG tablet Take 2.5 mg by mouth daily.    . naproxen sodium (ANAPROX) 220 MG tablet Take 440 mg by mouth daily as needed (pain).    . predniSONE (DELTASONE) 10 MG tablet Take 2 tablets (20 mg total) by mouth 2 (two) times daily. 10 tablet 0   No current facility-administered medications on file prior to visit.     Allergies  Allergen Reactions  . Shellfish Allergy Swelling    Turns red  . Strawberry Extract Rash    Family History  Problem Relation Age of Onset  . Heart disease Mother   . Kidney failure Mother   .  Diabetes Mother   . Diabetes Father   . Thyroid disease Neg Hx     BP (!) 142/96   Pulse 89   Ht 5\' 10"  (1.778 m)   Wt (!) 311 lb (141.1 kg)   SpO2 92%   BMI 44.62 kg/m   Review of Systems Denies hoarseness, visual loss, chest pain, cough, dysphagia, easy bruising, depression, cold intolerance, headache, numbness, and rhinorrhea.  He has chronic weight gain.  He has itching of the antecubital areas.      Objective:   Physical Exam VS: see vs page GEN: no distress.  Morbid obesity.  This limits exam.  HEAD: head: no deformity eyes: no periorbital swelling.  Slight bilat proptosis external nose and ears are normal mouth: no lesion seen NECK: supple, thyroid is not enlarged.  The nodule is non-palpable.   CHEST WALL: no deformity LUNGS: clear to auscultation CV: reg rate and rhythm, no murmur ABD: abdomen is soft, nontender.  no  hepatosplenomegaly.  not distended.  no hernia MUSCULOSKELETAL: muscle bulk and strength are grossly normal.  no obvious joint swelling.  gait is normal and steady EXTEMITIES: 1+ bilat leg edema PULSES: no carotid bruit NEURO:  cn 2-12 grossly intact.   readily moves all 4's.  sensation is intact to touch on all 4's SKIN:  Normal texture and temperature.  No rash or suspicious lesion is visible.   NODES:  None palpable at the neck PSYCH: alert, well-oriented.  Does not appear anxious nor depressed.  I have reviewed outside records, and summarized: Pt was seen for chest pain and sob.  CT was ordered  Lab Results  Component Value Date   TSH 0.38 03/23/2016      Assessment & Plan:  Thyroid nodule, new, uncertain etiology. Low-normal TSH suggests the nodule is hyperfunctioning.  Patient is advised the following:  Patient Instructions  blood tests are requested for you today.  We'll let you know about the results. Let's check the ultrasound.  you will receive a phone call, about a day and time for an appointment. If the blood test is normal, and there is just the one nodule, all you need to do is to come back in 1 year.

## 2016-04-13 ENCOUNTER — Ambulatory Visit
Admission: RE | Admit: 2016-04-13 | Discharge: 2016-04-13 | Disposition: A | Discharge status: Home / Self Care | Payer: BLUE CROSS/BLUE SHIELD | Source: Ambulatory Visit | Attending: Endocrinology | Admitting: Endocrinology

## 2016-04-13 DIAGNOSIS — E042 Nontoxic multinodular goiter: Secondary | ICD-10-CM | POA: Diagnosis not present

## 2016-04-13 DIAGNOSIS — E041 Nontoxic single thyroid nodule: Secondary | ICD-10-CM

## 2016-04-16 DIAGNOSIS — G4733 Obstructive sleep apnea (adult) (pediatric): Secondary | ICD-10-CM | POA: Diagnosis not present

## 2016-12-24 ENCOUNTER — Ambulatory Visit (HOSPITAL_COMMUNITY): Payer: BLUE CROSS/BLUE SHIELD

## 2016-12-29 ENCOUNTER — Ambulatory Visit: Payer: BLUE CROSS/BLUE SHIELD | Admitting: Pulmonary Disease

## 2017-02-23 DIAGNOSIS — R809 Proteinuria, unspecified: Secondary | ICD-10-CM | POA: Diagnosis not present

## 2017-02-23 DIAGNOSIS — Z1389 Encounter for screening for other disorder: Secondary | ICD-10-CM | POA: Diagnosis not present

## 2017-02-23 DIAGNOSIS — I10 Essential (primary) hypertension: Secondary | ICD-10-CM | POA: Diagnosis not present

## 2017-02-23 DIAGNOSIS — Z6841 Body Mass Index (BMI) 40.0 and over, adult: Secondary | ICD-10-CM | POA: Diagnosis not present

## 2017-02-23 DIAGNOSIS — E1165 Type 2 diabetes mellitus with hyperglycemia: Secondary | ICD-10-CM | POA: Diagnosis not present

## 2017-02-23 DIAGNOSIS — Z0000 Encounter for general adult medical examination without abnormal findings: Secondary | ICD-10-CM | POA: Diagnosis not present

## 2017-02-23 LAB — HEMOGLOBIN A1C: Hemoglobin A1C: 10.8

## 2017-03-02 DIAGNOSIS — E782 Mixed hyperlipidemia: Secondary | ICD-10-CM | POA: Diagnosis not present

## 2017-03-02 DIAGNOSIS — E1165 Type 2 diabetes mellitus with hyperglycemia: Secondary | ICD-10-CM | POA: Diagnosis not present

## 2017-03-02 DIAGNOSIS — Z0000 Encounter for general adult medical examination without abnormal findings: Secondary | ICD-10-CM | POA: Diagnosis not present

## 2017-03-02 DIAGNOSIS — I10 Essential (primary) hypertension: Secondary | ICD-10-CM | POA: Diagnosis not present

## 2017-03-08 NOTE — Progress Notes (Signed)
  Cardiology Office Note   Date:  03/11/2017   ID:  [name], DOB 05/04/1966, MRN [id number]  PCP:  Fusco, Lawrence, MD  Cardiologist:   Francesa Eugenio, MD   No chief complaint on file.     History of Present Illness: Joel Ward is a 51 y.o. male who presents for consultation regarding exertional dyspnea and chest tightness. Referred  By Dr Fusco. CRF;s include DM, HTN, HLD. Reviewed Dr Fusco's office note 02/23/17 complained about dyspnea for a year, worse with exertion some help with albuterol sometimes radiates to jaw. Sometimes feels like a piece of food stuck in chest  He was seen in ER 02/07/16 for chest pain. That time pain radiated to shoulder blades R/O given prednisone and told to f/u with primary CTA negative for aortic or pulmonary pathology 6 mm RLL nodule He likes sweets and mentioned Maxi B;s Discussed need for low carb Diet to control his DM. Some of his history is worrisome in that he gets exertional tightness in his chest and jaw. Given his body habitus Discussed possibility of false positive myovue and favored diagnostic cath for diagnosis of CAD Risks including bleeding/ stroke need for  Emergency surgery willing to proceed   He works at [employer]. Has older son with PTSD from [place] and is caring for his 8/10 yo grandkids   Past Medical History:  Diagnosis Date  . Diabetes mellitus    Type 2 diagnosed 2 weeks ago  . Hiatal hernia with gastroesophageal reflux   . Hypertension   . Mixed hyperlipidemia   . Morbid obesity (HCC)   . OSA on CPAP   . Urinary frequency     Past Surgical History:  Procedure Laterality Date  . CIRCUMCISION     1988  . ESOPHAGEAL DILATION N/A 02/08/2015   Procedure: ESOPHAGEAL DILATION;  Surgeon: [name] [name], MD;  [facility];  Service: Endoscopy;  Laterality: N/A;  . ESOPHAGOGASTRODUODENOSCOPY N/A 02/08/2015   Procedure: ESOPHAGOGASTRODUODENOSCOPY (EGD);  Surgeon: [name] [name], MD;  [facility];  Service: Endoscopy;  Laterality: N/A;  1055  . ESOPHAGOGASTRODUODENOSCOPY (EGD) WITH ESOPHAGEAL DILATION  03/29/2012   Procedure: ESOPHAGOGASTRODUODENOSCOPY (EGD) WITH ESOPHAGEAL DILATION;  Surgeon: [name] [name], MD;  [facility];  Service: Endoscopy;  Laterality: N/A;  730     Current Outpatient Prescriptions  Medication Sig Dispense Refill  . acetaminophen (TYLENOL) 325 MG tablet Take 650 mg by mouth every 6 (six) hours as needed. For pain    . albuterol (PROVENTIL HFA;VENTOLIN HFA) 108 (90 Base) MCG/ACT inhaler Inhale into the lungs every 6 (six) hours as needed for wheezing or shortness of breath.    . esomeprazole (NEXIUM) 20 MG capsule Take 20 mg by mouth daily as needed (acid reflux).     . ibuprofen (ADVIL,MOTRIN) 200 MG tablet Take 400 mg by mouth every 6 (six) hours as needed for moderate pain.     . lisinopril (PRINIVIL,ZESTRIL) 10 MG tablet Take 10 mg by mouth daily.     . metFORMIN (GLUCOPHAGE) 500 MG tablet Take 500 mg by mouth 2 (two) times daily with a meal.    . naproxen sodium (ANAPROX) 220 MG tablet Take 440 mg by mouth daily as needed (pain).    . pioglitazone (ACTOS) 45 MG tablet Take 45 mg by mouth daily. One tab once daily    . predniSONE (DELTASONE) 10 MG tablet Take 2 tablets (20 mg total) by mouth 2 (two)   times daily. 10 tablet 0  . sitaGLIPtin-metformin (JANUMET) 50-1000 MG tablet Take 1 tablet by mouth 2 (two) times daily with a meal.     No current facility-administered medications for this visit.     Allergies:   Shellfish allergy and Strawberry extract    Social History:  The patient  reports that he has never smoked. He has never used smokeless tobacco. He reports that he does not drink alcohol or use drugs.   Family History:  The patient's family history includes Diabetes in his father and mother; Heart disease in his mother; Kidney failure in his mother.    ROS:  Please see the history of present illness.   Otherwise, review  of systems are positive for none.   All other systems are reviewed and negative.    PHYSICAL EXAM: VS:  BP (!) 144/90 (BP Location: Left Arm)   Pulse 89   Ht  (1.803 m)   Wt 295 lb (133.8 kg)   SpO2 97%   BMI 41.14 kg/m  , BMI Body mass index is 41.14 kg/m. Affect appropriate Overweight black male  HEENT: normal Neck supple with no adenopathy JVP normal no bruits no thyromegaly Lungs clear with no wheezing and good diaphragmatic motion Heart:  S1/S2 no murmur, no rub, gallop or click PMI normal Abdomen: benighn, BS positve, no tenderness, no AAA no bruit.  No HSM or HJR Distal pulses intact with no bruits No edema Neuro non-focal Skin warm and dry No muscular weakness    EKG:  02/10/16 SR rate 81 normal ECG    Recent Labs: 03/23/2016: TSH 0.38    Lipid Panel No results found for: CHOL, TRIG, HDL, CHOLHDL, VLDL, LDLCALC, LDLDIRECT    Wt Readings from Last 3 Encounters:  03/11/17 295 lb (133.8 kg)  03/23/16 (!) 311 lb (141.1 kg)  03/18/16 (!) 309 lb (140.2 kg)      Other studies Reviewed: Additional studies/ records that were reviewed today include: Notes from ER 02/10/16 including CTA, CXR labs, ECG Notes from primary .    ASSESSMENT AND PLAN:  1.  Chest Pain with DM Worrisome aspects for angina Cath arranged for Dr Katrinka Blazing 10/16 Lab called orders written 2. Dyspnea normal exam cath arranged can check EDP and LV gram may need f/u echo  3. DM Discussed low carb diet.  Target hemoglobin A1c is 6.5 or less.  Continue current medications. 4. HTN Well controlled.  Continue current medications and low sodium Dash type diet.   5. GERD related to obesity and poor food choices continue nexium    Current medicines are reviewed at length with the patient today.  The patient does not have concerns regarding medicines.  The following changes have been made:  no change  Labs/ tests ordered today include: Cath with CXR and labs   Orders Placed This Encounter    Procedures  . DG Chest 2 View  . CBC with Differential  . Basic Metabolic Panel (BMET)  . INR/PT  . EKG 12-Lead     Disposition:   FU with cardiology post cath if disease      Signed, Charlton Haws, MD  03/11/2017 11:34 AM    [facility] [address] [place], [place], [place]  [address] Phone: [phone]; Fax: [phone]

## 2017-03-11 ENCOUNTER — Ambulatory Visit (INDEPENDENT_AMBULATORY_CARE_PROVIDER_SITE_OTHER): Payer: BLUE CROSS/BLUE SHIELD | Admitting: Cardiovascular Disease

## 2017-03-11 ENCOUNTER — Other Ambulatory Visit (HOSPITAL_COMMUNITY): Payer: Self-pay | Admitting: Cardiovascular Disease

## 2017-03-11 ENCOUNTER — Ambulatory Visit (HOSPITAL_COMMUNITY)
Admission: RE | Admit: 2017-03-11 | Discharge: 2017-03-11 | Disposition: A | Discharge status: Home / Self Care | Payer: BLUE CROSS/BLUE SHIELD | Source: Ambulatory Visit | Attending: Cardiovascular Disease | Admitting: Cardiovascular Disease

## 2017-03-11 ENCOUNTER — Other Ambulatory Visit (HOSPITAL_COMMUNITY)
Admission: RE | Admit: 2017-03-11 | Discharge: 2017-03-11 | Disposition: A | Discharge status: Home / Self Care | Payer: BLUE CROSS/BLUE SHIELD | Source: Ambulatory Visit | Attending: Cardiovascular Disease | Admitting: Cardiovascular Disease

## 2017-03-11 ENCOUNTER — Encounter: Payer: Self-pay | Admitting: Cardiovascular Disease

## 2017-03-11 VITALS — BP 144/90 | HR 89 | Ht 71.0 in | Wt 295.0 lb

## 2017-03-11 DIAGNOSIS — Z01812 Encounter for preprocedural laboratory examination: Secondary | ICD-10-CM | POA: Diagnosis not present

## 2017-03-11 DIAGNOSIS — Z01818 Encounter for other preprocedural examination: Secondary | ICD-10-CM | POA: Insufficient documentation

## 2017-03-11 DIAGNOSIS — R0602 Shortness of breath: Secondary | ICD-10-CM

## 2017-03-11 DIAGNOSIS — R079 Chest pain, unspecified: Secondary | ICD-10-CM

## 2017-03-11 LAB — CBC WITH DIFFERENTIAL/PLATELET
BASOS PCT: 0 %
Basophils Absolute: 0 10*3/uL (ref 0.0–0.1)
Eosinophils Absolute: 0.1 10*3/uL (ref 0.0–0.7)
Eosinophils Relative: 2 %
HEMATOCRIT: 40.8 % (ref 39.0–52.0)
Hemoglobin: 13.8 g/dL (ref 13.0–17.0)
Lymphocytes Relative: 30 %
Lymphs Abs: 2.2 10*3/uL (ref 0.7–4.0)
MCH: 27.3 pg (ref 26.0–34.0)
MCHC: 33.8 g/dL (ref 30.0–36.0)
MCV: 80.8 fL (ref 78.0–100.0)
MONO ABS: 0.5 10*3/uL (ref 0.1–1.0)
Monocytes Relative: 7 %
Neutro Abs: 4.4 10*3/uL (ref 1.7–7.7)
Neutrophils Relative %: 61 %
Platelets: 360 10*3/uL (ref 150–400)
RBC: 5.05 MIL/uL (ref 4.22–5.81)
RDW: 14.1 % (ref 11.5–15.5)
WBC: 7.2 10*3/uL (ref 4.0–10.5)

## 2017-03-11 LAB — BASIC METABOLIC PANEL
Anion gap: 8 (ref 5–15)
BUN: 12 mg/dL (ref 6–20)
CALCIUM: 9.4 mg/dL (ref 8.9–10.3)
CO2: 29 mmol/L (ref 22–32)
CREATININE: 0.89 mg/dL (ref 0.61–1.24)
Chloride: 98 mmol/L — ABNORMAL LOW (ref 101–111)
GFR calc non Af Amer: >60 mL/min (ref >60)
Glucose, Bld: 165 mg/dL — ABNORMAL HIGH (ref 65–99)
Potassium: 4.2 mmol/L (ref 3.5–5.1)
Sodium: 135 mmol/L (ref 135–145)

## 2017-03-11 LAB — PROTIME-INR
INR: 0.93
Prothrombin Time: 12.4 seconds (ref 11.4–15.2)

## 2017-03-11 NOTE — Patient Instructions (Addendum)
Medication Instructions:  Your physician recommends that you continue on your current medications as directed. Please refer to the Current Medication list given to you today.   Labwork: TODAY BMET CBC INR  Testing/Procedures: A chest x-ray takes a picture of the organs and structures inside the chest, including the heart, lungs, and blood vessels. This test can show several things, including, whether the heart is enlarges; whether fluid is building up in the lungs; and whether pacemaker / defibrillator leads are still in place.   Follow-Up: Your physician recommends that you schedule a follow-up appointment in: TO BE DETERMINED    Any Other Special Instructions Will Be Listed Below (If Applicable).     If you need a refill on your cardiac medications before your next appointment, please call your pharmacy.    [facility] [address] [place] [address] Dept: [phone] Loc: [phone]  Joel Ward  03/11/2017  You are scheduled for a Cardiac Catheterization on Tuesday, October 16 with Dr. Verdis Prime.  1. Please arrive at the [facility] (Main Entrance A) at [facility]: [address] [place], [place] [address] at 5:30 AM (two hours before your procedure to ensure your preparation). Free valet parking service is available.   Special note: Every effort is made to have your procedure done on time. Please understand that emergencies sometimes delay scheduled procedures.  2. Diet: Do not eat or drink anything after midnight prior to your procedure except sips of water to take medications.  3. Labs: You will need to have blood drawn on Thursday, October 11 at [facility] [address],   Open: 7am - 6pm, Sat 8am - 12 noon   Phone: [phone]. You do not need to be fasting.  4. Medication instructions in preparation for your procedure:   Stop taking,  Glucophage (Metformin) on Sunday, October 15.   Do not take Janumet the night before or the morning of procedure.  Do not take the Actos the morning of procrdure.    On the morning of your procedure, take your Aspirin and any morning medicines NOT listed above.  You may use sips of water.  5. Plan for one night stay--bring personal belongings. 6. Bring a current list of your medications and current insurance cards. 7. You MUST have a responsible person to drive you home. 8. Someone MUST be with you the first 24 hours after you arrive home or your discharge will be delayed. 9. Please wear clothes that are easy to get on and off and wear slip-on shoes.  Thank you for allowing [place] to care for you!   -- [facility]

## 2017-03-15 ENCOUNTER — Telehealth: Payer: Self-pay

## 2017-03-15 DIAGNOSIS — R0609 Other forms of dyspnea: Secondary | ICD-10-CM

## 2017-03-15 NOTE — Telephone Encounter (Signed)
Patient contacted pre-catheterization at [facility] scheduled for:  03/16/2017 @ 0730 Verified arrival time and place:  [facility] @ 0530 Confirmed AM meds to be taken pre-cath with sip of water: Last dose metformin/ Janumet Sunday evening (Pt states took Janumet this am, last dose of glucophage was Sunday evening) Take ASA Confirmed patient has responsible person to drive home post procedure and observe patient for 24 hours:  yes Addl concerns:  Last dose of Janumet was 03/15/2017 @ 0900.  Notified Dr. Katrinka Blazing.  Dr. Katrinka Blazing states ok, he should not take any more doses.  Notified Pt.

## 2017-03-16 ENCOUNTER — Encounter (HOSPITAL_COMMUNITY)
Admission: RE | Disposition: A | Discharge status: Home / Self Care | Payer: Self-pay | Source: Ambulatory Visit | Attending: Interventional Cardiology

## 2017-03-16 ENCOUNTER — Encounter (HOSPITAL_COMMUNITY): Payer: Self-pay | Admitting: Cardiology

## 2017-03-16 ENCOUNTER — Ambulatory Visit (HOSPITAL_COMMUNITY)
Admission: RE | Admit: 2017-03-16 | Discharge: 2017-03-16 | Disposition: A | Discharge status: Home / Self Care | Payer: BLUE CROSS/BLUE SHIELD | Source: Ambulatory Visit | Attending: Interventional Cardiology | Admitting: Interventional Cardiology

## 2017-03-16 DIAGNOSIS — R0609 Other forms of dyspnea: Secondary | ICD-10-CM

## 2017-03-16 DIAGNOSIS — E119 Type 2 diabetes mellitus without complications: Secondary | ICD-10-CM | POA: Insufficient documentation

## 2017-03-16 DIAGNOSIS — Z7982 Long term (current) use of aspirin: Secondary | ICD-10-CM | POA: Insufficient documentation

## 2017-03-16 DIAGNOSIS — R350 Frequency of micturition: Secondary | ICD-10-CM | POA: Insufficient documentation

## 2017-03-16 DIAGNOSIS — K219 Gastro-esophageal reflux disease without esophagitis: Secondary | ICD-10-CM | POA: Insufficient documentation

## 2017-03-16 DIAGNOSIS — R0600 Dyspnea, unspecified: Secondary | ICD-10-CM | POA: Diagnosis present

## 2017-03-16 DIAGNOSIS — Z6841 Body Mass Index (BMI) 40.0 and over, adult: Secondary | ICD-10-CM | POA: Diagnosis not present

## 2017-03-16 DIAGNOSIS — R079 Chest pain, unspecified: Secondary | ICD-10-CM

## 2017-03-16 DIAGNOSIS — I25110 Atherosclerotic heart disease of native coronary artery with unstable angina pectoris: Secondary | ICD-10-CM | POA: Diagnosis not present

## 2017-03-16 DIAGNOSIS — I25750 Atherosclerosis of native coronary artery of transplanted heart with unstable angina: Secondary | ICD-10-CM | POA: Diagnosis not present

## 2017-03-16 DIAGNOSIS — Z91018 Allergy to other foods: Secondary | ICD-10-CM | POA: Diagnosis not present

## 2017-03-16 DIAGNOSIS — E782 Mixed hyperlipidemia: Secondary | ICD-10-CM | POA: Insufficient documentation

## 2017-03-16 DIAGNOSIS — G4733 Obstructive sleep apnea (adult) (pediatric): Secondary | ICD-10-CM | POA: Insufficient documentation

## 2017-03-16 DIAGNOSIS — K449 Diaphragmatic hernia without obstruction or gangrene: Secondary | ICD-10-CM | POA: Diagnosis not present

## 2017-03-16 DIAGNOSIS — Z91013 Allergy to seafood: Secondary | ICD-10-CM | POA: Insufficient documentation

## 2017-03-16 DIAGNOSIS — G4730 Sleep apnea, unspecified: Secondary | ICD-10-CM | POA: Diagnosis present

## 2017-03-16 DIAGNOSIS — I10 Essential (primary) hypertension: Secondary | ICD-10-CM | POA: Diagnosis not present

## 2017-03-16 DIAGNOSIS — Z79899 Other long term (current) drug therapy: Secondary | ICD-10-CM | POA: Insufficient documentation

## 2017-03-16 DIAGNOSIS — G4710 Hypersomnia, unspecified: Secondary | ICD-10-CM | POA: Diagnosis not present

## 2017-03-16 DIAGNOSIS — Z955 Presence of coronary angioplasty implant and graft: Secondary | ICD-10-CM

## 2017-03-16 DIAGNOSIS — Z7984 Long term (current) use of oral hypoglycemic drugs: Secondary | ICD-10-CM | POA: Diagnosis not present

## 2017-03-16 HISTORY — DX: Atherosclerotic heart disease of native coronary artery without angina pectoris: I25.10

## 2017-03-16 HISTORY — PX: LEFT HEART CATH AND CORONARY ANGIOGRAPHY: CATH118249

## 2017-03-16 HISTORY — PX: CORONARY STENT INTERVENTION: CATH118234

## 2017-03-16 HISTORY — PX: ULTRASOUND GUIDANCE FOR VASCULAR ACCESS: SHX6516

## 2017-03-16 HISTORY — DX: Atherosclerotic heart disease of native coronary artery with unstable angina pectoris: I25.110

## 2017-03-16 LAB — POCT ACTIVATED CLOTTING TIME
ACTIVATED CLOTTING TIME: 268 s
ACTIVATED CLOTTING TIME: 378 s
Activated Clotting Time: 246 seconds

## 2017-03-16 LAB — GLUCOSE, CAPILLARY
GLUCOSE-CAPILLARY: 200 mg/dL — AB (ref 65–99)
GLUCOSE-CAPILLARY: 142 mg/dL — AB (ref 65–99)

## 2017-03-16 SURGERY — LEFT HEART CATH AND CORONARY ANGIOGRAPHY
Anesthesia: LOCAL

## 2017-03-16 MED ORDER — NITROGLYCERIN 1 MG/10 ML FOR IR/CATH LAB
INTRA_ARTERIAL | Status: AC
  Filled 2017-03-16: qty 10

## 2017-03-16 MED ORDER — SODIUM CHLORIDE 0.9 % IV SOLN: INTRAVENOUS | Status: AC

## 2017-03-16 MED ORDER — PANTOPRAZOLE SODIUM 40 MG PO TBEC: 40.0000 mg | DELAYED_RELEASE_TABLET | Freq: Every day | ORAL | Status: DC

## 2017-03-16 MED ORDER — TICAGRELOR 90 MG PO TABS
ORAL_TABLET | ORAL | Status: AC
  Filled 2017-03-16: qty 2

## 2017-03-16 MED ORDER — NITROGLYCERIN 0.4 MG SL SUBL: 0.4000 mg | SUBLINGUAL_TABLET | SUBLINGUAL | 2 refills | Status: AC | PRN

## 2017-03-16 MED ORDER — ASPIRIN 81 MG PO CHEW: 81.0000 mg | CHEWABLE_TABLET | Freq: Every day | ORAL | Status: DC

## 2017-03-16 MED ORDER — TICAGRELOR 90 MG PO TABS: 90.0000 mg | ORAL_TABLET | Freq: Two times a day (BID) | ORAL | 1 refills | Status: DC

## 2017-03-16 MED ORDER — SODIUM CHLORIDE 0.9% FLUSH: 3.0000 mL | INTRAVENOUS | Status: DC | PRN

## 2017-03-16 MED ORDER — LABETALOL HCL 5 MG/ML IV SOLN: 10.0000 mg | INTRAVENOUS | Status: AC | PRN

## 2017-03-16 MED ORDER — ALBUTEROL SULFATE HFA 108 (90 BASE) MCG/ACT IN AERS: 1.0000 | INHALATION_SPRAY | Freq: Four times a day (QID) | RESPIRATORY_TRACT | Status: DC | PRN

## 2017-03-16 MED ORDER — SODIUM CHLORIDE 0.9% FLUSH: 3.0000 mL | Freq: Two times a day (BID) | INTRAVENOUS | Status: DC

## 2017-03-16 MED ORDER — TICAGRELOR 90 MG PO TABS
ORAL_TABLET | ORAL | Status: DC | PRN
  Administered 2017-03-16: 180 mg via ORAL

## 2017-03-16 MED ORDER — IOPAMIDOL (ISOVUE-370) INJECTION 76%
INTRAVENOUS | Status: AC
  Filled 2017-03-16: qty 100

## 2017-03-16 MED ORDER — HEPARIN (PORCINE) IN NACL 2-0.9 UNIT/ML-% IJ SOLN
INTRAMUSCULAR | Status: AC | PRN
  Administered 2017-03-16: 1000 mL

## 2017-03-16 MED ORDER — HEPARIN SODIUM (PORCINE) 1000 UNIT/ML IJ SOLN
INTRAMUSCULAR | Status: DC | PRN
  Administered 2017-03-16: 9000 [IU] via INTRAVENOUS
  Administered 2017-03-16 (×2): 4000 [IU] via INTRAVENOUS
  Administered 2017-03-16: 6000 [IU] via INTRAVENOUS

## 2017-03-16 MED ORDER — LISINOPRIL 10 MG PO TABS: 10.0000 mg | ORAL_TABLET | Freq: Every evening | ORAL | Status: DC

## 2017-03-16 MED ORDER — ASPIRIN 81 MG PO CHEW
CHEWABLE_TABLET | ORAL | Status: AC
  Administered 2017-03-16: 81 mg via ORAL
  Filled 2017-03-16: qty 1

## 2017-03-16 MED ORDER — FENTANYL CITRATE (PF) 100 MCG/2ML IJ SOLN
INTRAMUSCULAR | Status: DC | PRN
  Administered 2017-03-16 (×2): 50 ug via INTRAVENOUS

## 2017-03-16 MED ORDER — IOPAMIDOL (ISOVUE-370) INJECTION 76%
INTRAVENOUS | Status: DC | PRN
  Administered 2017-03-16: 270 mL via INTRAVENOUS

## 2017-03-16 MED ORDER — SODIUM CHLORIDE 0.9 % WEIGHT BASED INFUSION
3.0000 mL/kg/h | INTRAVENOUS | Status: AC
  Administered 2017-03-16: 3 mL/kg/h via INTRAVENOUS

## 2017-03-16 MED ORDER — ATORVASTATIN CALCIUM 80 MG PO TABS: 80.0000 mg | ORAL_TABLET | Freq: Every day | ORAL | Status: DC

## 2017-03-16 MED ORDER — ATORVASTATIN CALCIUM 80 MG PO TABS: 80.0000 mg | ORAL_TABLET | Freq: Every day | ORAL | 1 refills | Status: DC

## 2017-03-16 MED ORDER — ASPIRIN 81 MG PO CHEW: 81.0000 mg | CHEWABLE_TABLET | Freq: Every day | ORAL | 0 refills | Status: DC

## 2017-03-16 MED ORDER — LIDOCAINE HCL 2 % IJ SOLN
INTRAMUSCULAR | Status: AC
  Filled 2017-03-16: qty 10

## 2017-03-16 MED ORDER — HYDRALAZINE HCL 20 MG/ML IJ SOLN: 5.0000 mg | INTRAMUSCULAR | Status: AC | PRN

## 2017-03-16 MED ORDER — LIDOCAINE HCL 2 % IJ SOLN
INTRAMUSCULAR | Status: DC | PRN
  Administered 2017-03-16: 3 mL

## 2017-03-16 MED ORDER — ASPIRIN 81 MG PO CHEW
81.0000 mg | CHEWABLE_TABLET | ORAL | Status: AC
  Administered 2017-03-16: 81 mg via ORAL

## 2017-03-16 MED ORDER — TICAGRELOR 90 MG PO TABS: 90.0000 mg | ORAL_TABLET | Freq: Two times a day (BID) | ORAL | 0 refills | Status: DC

## 2017-03-16 MED ORDER — MIDAZOLAM HCL 2 MG/2ML IJ SOLN
INTRAMUSCULAR | Status: DC | PRN
  Administered 2017-03-16 (×2): 1 mg via INTRAVENOUS

## 2017-03-16 MED ORDER — ONDANSETRON HCL 4 MG/2ML IJ SOLN: 4.0000 mg | Freq: Four times a day (QID) | INTRAMUSCULAR | Status: DC | PRN

## 2017-03-16 MED ORDER — ANGIOPLASTY BOOK
Freq: Once | Status: DC
  Filled 2017-03-16 (×2): qty 1

## 2017-03-16 MED ORDER — HEPARIN SODIUM (PORCINE) 1000 UNIT/ML IJ SOLN
INTRAMUSCULAR | Status: AC
  Filled 2017-03-16: qty 1

## 2017-03-16 MED ORDER — FENTANYL CITRATE (PF) 100 MCG/2ML IJ SOLN
INTRAMUSCULAR | Status: AC
  Filled 2017-03-16: qty 2

## 2017-03-16 MED ORDER — KETOTIFEN FUMARATE 0.025 % OP SOLN: 2.0000 [drp] | Freq: Every day | OPHTHALMIC | Status: DC | PRN

## 2017-03-16 MED ORDER — TICAGRELOR 90 MG PO TABS: 90.0000 mg | ORAL_TABLET | Freq: Two times a day (BID) | ORAL | Status: DC

## 2017-03-16 MED ORDER — VERAPAMIL HCL 2.5 MG/ML IV SOLN
INTRAVENOUS | Status: DC | PRN
  Administered 2017-03-16: 10 mL via INTRA_ARTERIAL

## 2017-03-16 MED ORDER — MIDAZOLAM HCL 2 MG/2ML IJ SOLN
INTRAMUSCULAR | Status: AC
  Filled 2017-03-16: qty 2

## 2017-03-16 MED ORDER — SODIUM CHLORIDE 0.9 % WEIGHT BASED INFUSION: 1.0000 mL/kg/h | INTRAVENOUS | Status: DC

## 2017-03-16 MED ORDER — SODIUM CHLORIDE 0.9 % IV SOLN: 250.0000 mL | INTRAVENOUS | Status: DC | PRN

## 2017-03-16 MED ORDER — OXYCODONE HCL 5 MG PO TABS: 5.0000 mg | ORAL_TABLET | ORAL | Status: DC | PRN

## 2017-03-16 MED ORDER — VERAPAMIL HCL 2.5 MG/ML IV SOLN
INTRAVENOUS | Status: AC
  Filled 2017-03-16: qty 2

## 2017-03-16 MED ORDER — ACETAMINOPHEN 325 MG PO TABS: 650.0000 mg | ORAL_TABLET | ORAL | Status: DC | PRN

## 2017-03-16 MED ORDER — NITROGLYCERIN 1 MG/10 ML FOR IR/CATH LAB
INTRA_ARTERIAL | Status: DC | PRN
  Administered 2017-03-16 (×2): 200 ug via INTRACORONARY

## 2017-03-16 MED ORDER — HEPARIN (PORCINE) IN NACL 2-0.9 UNIT/ML-% IJ SOLN
INTRAMUSCULAR | Status: AC
  Filled 2017-03-16: qty 1000

## 2017-03-16 MED FILL — BRILINTA 90 MG TABLET: 90 | 30 days supply | Qty: 60 | Fill #0

## 2017-03-16 MED FILL — ASPIRIN CHILD 81 MG TAB CHE: 81 | 36 days supply | Qty: 36 | Fill #0

## 2017-03-16 SURGICAL SUPPLY — 20 items
BALLN SAPPHIRE 3.0X12 (BALLOONS) ×3
BALLN SAPPHIRE ~~LOC~~ 3.75X8 (BALLOONS) ×3 IMPLANT
BALLOON SAPPHIRE 3.0X12 (BALLOONS) ×2 IMPLANT
CATH INFINITI 5 FR JL3.5 (CATHETERS) ×3 IMPLANT
CATH INFINITI JR4 5F (CATHETERS) ×3 IMPLANT
CATH VISTA GUIDE 6FR XBLAD3.0 (CATHETERS) ×3 IMPLANT
CATH VISTA GUIDE 6FR XBLAD3.5 (CATHETERS) ×3 IMPLANT
COVER PRB 48X5XTLSCP FOLD TPE (BAG) ×2 IMPLANT
COVER PROBE 5X48 (BAG) ×1
DEVICE RAD COMP TR BAND LRG (VASCULAR PRODUCTS) ×3 IMPLANT
GLIDESHEATH SLEND A-KIT 6F 22G (SHEATH) ×3 IMPLANT
GUIDEWIRE INQWIRE 1.5J.035X260 (WIRE) ×2 IMPLANT
INQWIRE 1.5J .035X260CM (WIRE) ×3
KIT ENCORE 26 ADVANTAGE (KITS) ×3 IMPLANT
KIT HEART LEFT (KITS) ×3 IMPLANT
PACK CARDIAC CATHETERIZATION (CUSTOM PROCEDURE TRAY) ×3 IMPLANT
STENT RESOLUTE ONYX 3.5X18 (Permanent Stent) ×3 IMPLANT
TRANSDUCER W/STOPCOCK (MISCELLANEOUS) ×3 IMPLANT
TUBING CIL FLEX 10 FLL-RA (TUBING) ×3 IMPLANT
WIRE ASAHI PROWATER 180CM (WIRE) ×3 IMPLANT

## 2017-03-16 NOTE — Progress Notes (Signed)
Client c/o shortness of breath and PA Laverda Page notified and per Lillia Abed client given caffeine drink

## 2017-03-16 NOTE — H&P (View-Only) (Signed)
Cardiology Office Note   Date:  03/11/2017   ID:  Joel Ward, DOB 06-03-1965, MRN [id number]  PCP:  Elfredia Nevins, MD  Cardiologist:   Charlton Haws, MD   No chief complaint on file.     History of Present Illness: Joel Ward is a 51 y.o. male who presents for consultation regarding exertional dyspnea and chest tightness. Referred  By Dr Sherwood Gambler. CRF;s include DM, HTN, HLD. Reviewed Dr Sharyon Medicus office note 02/23/17 complained about dyspnea for a year, worse with exertion some help with albuterol sometimes radiates to jaw. Sometimes feels like a piece of food stuck in chest  He was seen in ER 02/07/16 for chest pain. That time pain radiated to shoulder blades R/O given prednisone and told to f/u with primary CTA negative for aortic or pulmonary pathology 6 mm RLL nodule He likes sweets and mentioned Maxi B;s Discussed need for low carb Diet to control his DM. Some of his history is worrisome in that he gets exertional tightness in his chest and jaw. Given his body habitus Discussed possibility of false positive myovue and favored diagnostic cath for diagnosis of CAD Risks including bleeding/ stroke need for  Emergency surgery willing to proceed   He works at [employer]. Has older son with PTSD from [place] and is caring for his 72/10 yo grandkids   Past Medical History:  Diagnosis Date  . Diabetes mellitus    Type 2 diagnosed 2 weeks ago  . Hiatal hernia with gastroesophageal reflux   . Hypertension   . Mixed hyperlipidemia   . Morbid obesity (HCC)   . OSA on CPAP   . Urinary frequency     Past Surgical History:  Procedure Laterality Date  . CIRCUMCISION     1988  . ESOPHAGEAL DILATION N/A 02/08/2015   Procedure: ESOPHAGEAL DILATION;  Surgeon: Malissa Hippo, MD;  [facility];  Service: Endoscopy;  Laterality: N/A;  . ESOPHAGOGASTRODUODENOSCOPY N/A 02/08/2015   Procedure: ESOPHAGOGASTRODUODENOSCOPY (EGD);  Surgeon: Malissa Hippo, MD;  [facility];  Service: Endoscopy;  Laterality: N/A;  1055  . ESOPHAGOGASTRODUODENOSCOPY (EGD) WITH ESOPHAGEAL DILATION  03/29/2012   Procedure: ESOPHAGOGASTRODUODENOSCOPY (EGD) WITH ESOPHAGEAL DILATION;  Surgeon: Malissa Hippo, MD;  [facility];  Service: Endoscopy;  Laterality: N/A;  730     Current Outpatient Prescriptions  Medication Sig Dispense Refill  . acetaminophen (TYLENOL) 325 MG tablet Take 650 mg by mouth every 6 (six) hours as needed. For pain    . albuterol (PROVENTIL HFA;VENTOLIN HFA) 108 (90 Base) MCG/ACT inhaler Inhale into the lungs every 6 (six) hours as needed for wheezing or shortness of breath.    . esomeprazole (NEXIUM) 20 MG capsule Take 20 mg by mouth daily as needed (acid reflux).     [name] ibuprofen (ADVIL,MOTRIN) 200 MG tablet Take 400 mg by mouth every 6 (six) hours as needed for moderate pain.     [name] lisinopril (PRINIVIL,ZESTRIL) 10 MG tablet Take 10 mg by mouth daily.     . metFORMIN (GLUCOPHAGE) 500 MG tablet Take 500 mg by mouth 2 (two) times daily with a meal.    . naproxen sodium (ANAPROX) 220 MG tablet Take 440 mg by mouth daily as needed (pain).    . pioglitazone (ACTOS) 45 MG tablet Take 45 mg by mouth daily. One tab once daily    . predniSONE (DELTASONE) 10 MG tablet Take 2 tablets (20 mg total) by mouth 2 (two)  times daily. 10 tablet 0  . sitaGLIPtin-metformin (JANUMET) 50-1000 MG tablet Take 1 tablet by mouth 2 (two) times daily with a meal.     No current facility-administered medications for this visit.     Allergies:   Shellfish allergy and Strawberry extract    Social History:  The patient  reports that he has never smoked. He has never used smokeless tobacco. He reports that he does not drink alcohol or use drugs.   Family History:  The patient's family history includes Diabetes in his father and mother; Heart disease in his mother; Kidney failure in his mother.    ROS:  Please see the history of present illness.   Otherwise, review  of systems are positive for none.   All other systems are reviewed and negative.    PHYSICAL EXAM: VS:  BP (!) 144/90 (BP Location: Left Arm)   Pulse 89   Ht  (1.803 m)   Wt 295 lb (133.8 kg)   SpO2 97%   BMI 41.14 kg/m  , BMI Body mass index is 41.14 kg/m. Affect appropriate Overweight black male  HEENT: normal Neck supple with no adenopathy JVP normal no bruits no thyromegaly Lungs clear with no wheezing and good diaphragmatic motion Heart:  S1/S2 no murmur, no rub, gallop or click PMI normal Abdomen: benighn, BS positve, no tenderness, no AAA no bruit.  No HSM or HJR Distal pulses intact with no bruits No edema Neuro non-focal Skin warm and dry No muscular weakness    EKG:  02/10/16 SR rate 81 normal ECG    Recent Labs: 03/23/2016: TSH 0.38    Lipid Panel No results found for: CHOL, TRIG, HDL, CHOLHDL, VLDL, LDLCALC, LDLDIRECT    Wt Readings from Last 3 Encounters:  03/11/17 295 lb (133.8 kg)  03/23/16 (!) 311 lb (141.1 kg)  03/18/16 (!) 309 lb (140.2 kg)      Other studies Reviewed: Additional studies/ records that were reviewed today include: Notes from ER 02/10/16 including CTA, CXR labs, ECG Notes from primary .    ASSESSMENT AND PLAN:  1.  Chest Pain with DM Worrisome aspects for angina Cath arranged for Dr Katrinka Blazing 10/16 Lab called orders written 2. Dyspnea normal exam cath arranged can check EDP and LV gram may need f/u echo  3. DM Discussed low carb diet.  Target hemoglobin A1c is 6.5 or less.  Continue current medications. 4. HTN Well controlled.  Continue current medications and low sodium Dash type diet.   5. GERD related to obesity and poor food choices continue nexium    Current medicines are reviewed at length with the patient today.  The patient does not have concerns regarding medicines.  The following changes have been made:  no change  Labs/ tests ordered today include: Cath with CXR and labs   Orders Placed This Encounter    Procedures  . DG Chest 2 View  . CBC with Differential  . Basic Metabolic Panel (BMET)  . INR/PT  . EKG 12-Lead     Disposition:   FU with cardiology post cath if disease      Signed, Charlton Haws, MD  03/11/2017 11:34 AM    [facility] [address] [place], [place], [place]  [address] Phone: [phone]; Fax: [phone]

## 2017-03-16 NOTE — Interval H&P Note (Signed)
Cath Lab Visit (complete for each Cath Lab visit)  Clinical Evaluation Leading to the Procedure:   ACS: No.  Non-ACS:    Anginal Classification: CCS III  Anti-ischemic medical therapy: Minimal Therapy (1 class of medications)  Non-Invasive Test Results: No non-invasive testing performed  Prior CABG: No previous CABG      History and Physical Interval Note:  03/16/2017 8:23 AM  Joel Ward  has presented today for surgery, with the diagnosis of angina  The various methods of treatment have been discussed with the patient and family. After consideration of risks, benefits and other options for treatment, the patient has consented to  Procedure(s): LEFT HEART CATH AND CORONARY ANGIOGRAPHY (N/A) Ultrasound Guidance For Vascular Access as a surgical intervention .  The patient's history has been reviewed, patient examined, no change in status, stable for surgery.  I have reviewed the patient's chart and labs.  Questions were answered to the patient's satisfaction.     Lyn Records III

## 2017-03-16 NOTE — Discharge Planning (Signed)
EDCM having benefits checked for ticagrelor (BRILINTA) 90 MG TABS tablet.  Will relay results to pt as they become available.  Lacreshia Bondarenko J. Lucretia Roers, RN, BSN, [place] [phone]

## 2017-03-16 NOTE — Progress Notes (Signed)
Cardiac rehab nurse in

## 2017-03-16 NOTE — Discharge Planning (Signed)
Benefits check for Brilinta results: BRILINTA 90 MG BID  COVER- YES  CO-PAY- $ 25.00  TIER- 2 DRUG  PRIOR APPROVAL- NO  PREFERRED PHARMACY : [facility] AND [facility]   Doral Ventrella J. Lucretia Roers, RN, BSN, [employer] [phone] Spoke with pt at bedside regarding benefits check for Brilinta.  Pt given brochure with 30 day free card and refill assistance card intact.  Pt utilizes [facility] on [facility] for prescription needs.  NCM called pharmacy to confirm availability of medication.  Information relayed to pt.  Pt verbalizes importance of filling medication upon discharge.

## 2017-03-16 NOTE — Progress Notes (Signed)
Mr. Nydam and his wife given brilinta and ASA RX for home. Mr. Poole educated about the significance of taking ASA and Brilinta. I explained the only person who can tell him to stop the brilinta and asa are the cardiologist. Both Mr. Corkum and his wife verbally acknowledges understanding of these instructions. Mr. Vanloan and his wife also made aware of the SOB that Brilinta may cause. This may occur for a couple of days and that caffeine is recommend and this should subside shortly there after. If he feels uneasy, he should call his provider. Patient and wife acknowledge understanding as he repeats them back to me.

## 2017-03-16 NOTE — Progress Notes (Signed)
States shortness of breath has decreased

## 2017-03-16 NOTE — Progress Notes (Signed)
[id number] Discussed with pt the importance of brilinta with stent. Asked RN to get case manager to see so pt will know cost. Discussed NTG use,carb counting, , ex ed and heart healthy food choices. Encouraged pt to weigh daily and notify cardiologist for weight gain or swelling in hands, feet or stomach. Discussed CRP 2 and will refer to [place]. Pt has been under a lot of stress and likes to work to keep busy. Encouraged taking care of self also. Pt stated he had not been taking his diabetic med like he should. We discussed the importance of keeping diabetes under control.  Luetta Nutting RN BSN 03/16/2017 1:07 PM

## 2017-03-16 NOTE — Discharge Summary (Signed)
The patient has been seen in conjunction with  Laverda Page, NP-C. All aspects of care have been considered and discussed. The patient has been personally interviewed, examined, and all clinical data has been reviewed.   High grade proximal to mid LAD treated with single stent and beautiful angiographic result.  Significant discussion about RFM including CPAP compliance.  DAPT for at least 6 months.  No work x 7 days.   Discharge Summary/Same Day PCI    [name],  MRN: [id number], DOB/AGE: 02-15-1966 51 y.o.  Admit date: 03/16/2017 Discharge date: 03/16/2017  Primary Care Provider: Elfredia Nevins Primary Cardiologist: Eden Emms  Discharge Diagnoses    Principal Problem:   DOE (dyspnea on exertion) Active Problems:   Hypersomnia with sleep apnea   Chest pain   Coronary artery disease involving native coronary artery of native heart with unstable angina pectoris (HCC)   Allergies Allergies  Allergen Reactions  . Shellfish Allergy Swelling    Turns red  . Strawberry Extract Rash    Diagnostic Studies/Procedures    Cath: 03/16/17  Conclusion    Class III angina pectoris correlating with a 95% proximal to mid LAD stenosis. The LAD also contains eccentric proximal less than 50% narrowing.  Normal left main, circumflex, and right coronary.  Normal left ventricular systolic function, with mild elevation in LVEDP of 21 mmHg consistent with diastolic heart failure.  Successful culprit PCI of the proximal to mid LAD stenosis reducing the 95% obstruction to 0% with TIMI grade 3 flow using an 18 x 3.5 mm Exposed Dilated to 3.75 MmHg High Pressure.   RECOMMENDATIONS:  Aggressive risk factor modification: High intensity statin therapy 2 LDL less than 70, hemoglobin A1c less than 6.5, weight reduction, blood pressure control 130/85 mmHg or less, continue ACE inhibitor therapy, consider adding low-dose beta blocker therapy,, and  exercise.  Same day discharge of no complications.  No work for 7 days and appropriate instruction concerning resuming activity.  Consider phase II cardiac rehabilitation for educational purposes.  _____________   History of Present Illness     51 y.o. male who presents for consultation regarding exertional dyspnea and chest tightness. Referred by Dr Sherwood Gambler. CRF;s include DM, HTN, HLD. Reviewed Dr Sharyon Medicus office note 02/23/17 complained about dyspnea for a year, worse with exertion some help with albuterol sometimes radiates to jaw. Sometimes feels like a piece of food stuck in chest. He was seen in ER 02/07/16 for chest pain. That time pain radiated to shoulder blades R/O given prednisone and told to f/u with primary CTA negative for aortic or pulmonary pathology 6 mm RLL nodule. He likes sweets and mentioned Maxi B's. Dr. Eden Emms discussed the need for low carb. Diet to control his DM. Some of his history was worrisome in that he gets exertional tightness in his chest and jaw. Given his body habitus Dr. Eden Emms discussed possibility of false positive myovue and favored diagnostic cath for diagnosis of CAD Risks including bleeding/ stroke need for emergency surgery willing to proceed.  He works at [employer]. Has older son with PTSD from [place] and is caring for his 63/10 yo grandkids   Hospital Course     He underwent outpatient cardiac cath noted above with Dr. Katrinka Blazing with successful PCI/DES x1 to the mLAD with TIMI grade 3 flow. Plan for DAPT with ASA/Brilinta. Needs aggressive risk factor modification. Started on high dose statin post cath. He was seen by cardiac rehab while in short stay.  Radial cath site stable. Instructions/restrictions given prior to discharge.   _____________  Discharge Vitals Blood pressure (!) 150/86, pulse 78, temperature (!) 97 F (36.1 C), temperature source Oral, resp. rate 18, height  (1.803 m), weight 288 lb (130.6 kg), SpO2 96 %.  Filed Weights   03/16/17  0600  Weight: 288 lb (130.6 kg)    Labs & Radiologic Studies    CBC No results for input(s): WBC, NEUTROABS, HGB, HCT, MCV, PLT in the last 72 hours. Basic Metabolic Panel No results for input(s): NA, K, CL, CO2, GLUCOSE, BUN, CREATININE, CALCIUM, MG, PHOS in the last 72 hours. Liver Function Tests No results for input(s): AST, ALT, ALKPHOS, BILITOT, PROT, ALBUMIN in the last 72 hours. No results for input(s): LIPASE, AMYLASE in the last 72 hours. Cardiac Enzymes No results for input(s): CKTOTAL, CKMB, CKMBINDEX, TROPONINI in the last 72 hours. BNP Invalid input(s): POCBNP D-Dimer No results for input(s): DDIMER in the last 72 hours. Hemoglobin A1C No results for input(s): HGBA1C in the last 72 hours. Fasting Lipid Panel No results for input(s): CHOL, HDL, LDLCALC, TRIG, CHOLHDL, LDLDIRECT in the last 72 hours. Thyroid Function Tests No results for input(s): TSH, T4TOTAL, T3FREE, THYROIDAB in the last 72 hours.  Invalid input(s): FREET3 _____________  Dg Chest 2 View  Result Date: 03/11/2017 CLINICAL DATA:  Cardiac catheterization. Shortness of breath. Chest tightness. EXAM: CHEST  2 VIEW COMPARISON:  02/07/2016 . FINDINGS: Mediastinum and hilar structures normal. Heart size normal. No focal infiltrate. No pleural effusion or pneumothorax. Thoracic spine scoliosis. IMPRESSION: No acute cardiopulmonary disease . Electronically Signed   By: Maisie Fus  Register   On: 03/11/2017 11:36   Disposition   Pt is being discharged home today in good condition.  Follow-up Plans & Appointments    Follow-up Information    Rosalio Macadamia, NP Follow up on 03/29/2017.   Specialties:  Nurse Practitioner, Interventional Cardiology, Cardiology, Radiology Why:  a 11am for your follow up appt.  Contact information: [address] [place] [address] [phone]          Discharge Instructions    Amb Referral to Cardiac Rehabilitation    Complete by:  As directed     Diagnosis:  Coronary Stents   Call MD for:  redness, tenderness, or signs of infection (pain, swelling, redness, odor or green/yellow discharge around incision site)    Complete by:  As directed    Diet - low sodium heart healthy    Complete by:  As directed    Discharge instructions    Complete by:  As directed    Radial Site Care Refer to this sheet in the next few weeks. These instructions provide you with information on caring for yourself after your procedure. Your caregiver may also give you more specific instructions. Your treatment has been planned according to current medical practices, but problems sometimes occur. Call your caregiver if you have any problems or questions after your procedure. HOME CARE INSTRUCTIONS You may shower the day after the procedure.Remove the bandage (dressing) and gently wash the site with plain soap and water.Gently pat the site dry.  Do not apply powder or lotion to the site.  Do not submerge the affected site in water for 3 to 5 days.  Inspect the site at least twice daily.  Do not flex or bend the affected arm for 24 hours.  No lifting over 5 pounds (2.3 kg) for 5 days after your procedure.  Do not  drive home if you are discharged the same day of the procedure. Have someone else drive you.  You may drive 24 hours after the procedure unless otherwise instructed by your caregiver.  What to expect: Any bruising will usually fade within 1 to 2 weeks.  Blood that collects in the tissue (hematoma) may be painful to the touch. It should usually decrease in size and tenderness within 1 to 2 weeks.  SEEK IMMEDIATE MEDICAL CARE IF: You have unusual pain at the radial site.  You have redness, warmth, swelling, or pain at the radial site.  You have drainage (other than a small amount of blood on the dressing).  You have chills.  You have a fever or persistent symptoms for more than 72 hours.  You have a fever and your symptoms suddenly get worse.  Your arm  becomes pale, cool, tingly, or numb.  You have heavy bleeding from the site. Hold pressure on the site.   PLEASE DO NOT MISS ANY DOSES OF YOUR BRILINTA!!!!! Also keep a log of you blood pressures and bring back to your follow up appt. Please call the office with any questions.   Patients taking blood thinners should generally stay away from medicines like ibuprofen, Advil, Motrin, naproxen, and Aleve due to risk of stomach bleeding. You may take Tylenol as directed or talk to your primary doctor about alternatives.   Increase activity slowly    Complete by:  As directed       Discharge Medications     Medication List    STOP taking these medications   ibuprofen 200 MG tablet Commonly known as:  ADVIL,MOTRIN     TAKE these medications   albuterol 108 (90 Base) MCG/ACT inhaler Commonly known as:  PROVENTIL HFA;VENTOLIN HFA Inhale 1-2 puffs into the lungs every 6 (six) hours as needed for wheezing or shortness of breath.   aspirin 81 MG chewable tablet Chew 1 tablet (81 mg total) by mouth daily.   atorvastatin 80 MG tablet Commonly known as:  LIPITOR Take 1 tablet (80 mg total) by mouth daily at 6 PM.   esomeprazole 20 MG capsule Commonly known as:  NEXIUM Take 20 mg by mouth daily as needed (acid reflux).   ketotifen 0.025 % ophthalmic solution Commonly known as:  ZADITOR Place 2 drops into both eyes daily as needed (allergies).   lisinopril 5 MG tablet Commonly known as:  PRINIVIL,ZESTRIL Take 10 mg by mouth every evening.   Melatonin 2.5 MG Caps Take 2.5 mg by mouth at bedtime as needed (sleep).   metFORMIN 500 MG tablet Commonly known as:  GLUCOPHAGE Take 1,000 mg by mouth every evening.   nitroGLYCERIN 0.4 MG SL tablet Commonly known as:  NITROSTAT Place 1 tablet (0.4 mg total) under the tongue every 5 (five) minutes as needed.   SIMILASAN PINK EYE RELIEF Soln Apply 1 drop to eye daily as needed (redness).   sitaGLIPtin-metformin 50-1000 MG  tablet Commonly known as:  JANUMET Take 1 tablet by mouth daily.   ticagrelor 90 MG Tabs tablet Commonly known as:  BRILINTA Take 1 tablet (90 mg total) by mouth 2 (two) times daily.        Outstanding Labs/Studies   FLP/LFTs in 6 weeks if tolerating statin.  Duration of Discharge Encounter   Greater than 30 minutes including physician time.  Signed, Laverda Page NP-C 03/16/2017, 2:44 PM

## 2017-03-17 ENCOUNTER — Encounter (HOSPITAL_COMMUNITY): Payer: Self-pay | Admitting: Interventional Cardiology

## 2017-03-18 ENCOUNTER — Telehealth: Payer: Self-pay | Admitting: Cardiovascular Disease

## 2017-03-18 NOTE — Progress Notes (Signed)
Pt c/o SOB, pt is  on brilinta, c/o left shoulder pain since LHC/ intervention. Pt was told to call Dr Michaelle CopasSmith's office to update them and to advise further. Pt has the number and verbalized understanding.

## 2017-03-18 NOTE — Telephone Encounter (Signed)
New message    Pt c/o medication issue:  1. Name of Medication:  Brillinta   2. How are you currently taking this medication (dosage and times per day)?  2x a day   3. Are you having a reaction (difficulty breathing--STAT)?  yes  4. What is your medication issue?  Sob and pain in arm    Also needs a letter saying he can not return to work until he sees doctor on 10/29th

## 2017-03-18 NOTE — Telephone Encounter (Signed)
Patient calling about -SOB -L shoulder pain -note for for work  He will try drinking caffeine 30 min prior to Brilinta dose.  Shoulder-Upper outer aspect of arm  (not cath side) started hurting on table in cath lab.  Hurts random times, not related to activity-lasts few minutes up to about 20 min each time.  Resolves on its own.    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  Moved appointment with Lawson FiscalLori up once week to 10/22.  Reviewed with Dr. Katrinka BlazingSmith who suspects SOB is from Brilinta and he'd like to keep him on it unless SOB becomes too much of a problem.  Feels shoulder pain is not related to heart.  Called pt back to update on this plan.  Had to leave message for him to call back.

## 2017-03-22 ENCOUNTER — Telehealth: Payer: Self-pay

## 2017-03-22 ENCOUNTER — Encounter: Payer: Self-pay | Admitting: Nurse Practitioner

## 2017-03-22 ENCOUNTER — Ambulatory Visit (INDEPENDENT_AMBULATORY_CARE_PROVIDER_SITE_OTHER): Payer: BLUE CROSS/BLUE SHIELD | Admitting: Nurse Practitioner

## 2017-03-22 VITALS — BP 116/78 | HR 97 | Ht 71.0 in | Wt 292.0 lb

## 2017-03-22 DIAGNOSIS — Z955 Presence of coronary angioplasty implant and graft: Secondary | ICD-10-CM

## 2017-03-22 DIAGNOSIS — I25110 Atherosclerotic heart disease of native coronary artery with unstable angina pectoris: Secondary | ICD-10-CM

## 2017-03-22 DIAGNOSIS — R0602 Shortness of breath: Secondary | ICD-10-CM

## 2017-03-22 DIAGNOSIS — E7849 Other hyperlipidemia: Secondary | ICD-10-CM | POA: Diagnosis not present

## 2017-03-22 NOTE — Progress Notes (Signed)
CARDIOLOGY OFFICE NOTE  Date:  03/22/2017    Joel Ward Date of Birth: 1965-11-05 Medical Record #[id number]  PCP:  Elfredia NevinsFusco, Lawrence, MD  Cardiologist:  Eden EmmsNishan   Chief Complaint  Patient presents with  . Coronary Artery Disease    Post cath visit - seen for Dr. Eden EmmsNishan    History of Present Illness: Joel AspFrederick J Maita is a 51 y.o. male who presents today for a post hospital visit. Seen for Dr. Eden EmmsNishan.   Referred here earlier this month for chest pain and dyspnea from his PCP. He has DM, HTN and HLD. He was referred for outpatient cardiac cath noted with Dr. Katrinka BlazingSmith with successful PCI/DES x1 to the mLAD with TIMI grade 3 flow. Plan for DAPT with ASA/Brilinta and aggressive risk factor modification. High dose statin started. Noted to have dyspnea - advised to take Brilinta with caffeine.   Comes in today. Here alone. No more chest pain. Fatigued. Has OSA - suppose to use CPAP but admits he is not compliant. Was using lots of energy drinks. He is trying to really change his diet - admits it is hard - ate a lot of fast food/sugars - but he is trying. Breathing has improved. Has a red itchy rash on the right forearm. Cath site otherwise is ok. 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 Tolerating his medicines. Ok on the GilbertBrilinta now. Need to discuss return to work. [place] - would like his care transitioned there.   Past Medical History:  Diagnosis Date  . CAD (coronary artery disease), native coronary artery    10/18 PCI/DES mLAD, normal EF on LV gram  . Diabetes mellitus    Type 2 diagnosed 2 weeks ago  . Hiatal hernia with gastroesophageal reflux   . Hypertension   . Mixed hyperlipidemia   . Morbid obesity (HCC)   . OSA on CPAP   . Urinary frequency     Past Surgical History:  Procedure Laterality Date  . CIRCUMCISION     1988  . CORONARY STENT INTERVENTION N/A 03/16/2017   Procedure:  CORONARY STENT INTERVENTION;  Surgeon: Lyn RecordsSmith, Henry W, MD;  [facility];  Service: Cardiovascular;  Laterality: N/A;  . ESOPHAGEAL DILATION N/A 02/08/2015   Procedure: ESOPHAGEAL DILATION;  Surgeon: Malissa HippoNajeeb U [name], MD;  Location[facility];  Service: Endoscopy;  Laterality: N/A;  . ESOPHAGOGASTRODUODENOSCOPY N/A 02/08/2015   Procedure: ESOPHAGOGASTRODUODENOSCOPY (EGD);  Surgeon: Malissa HippoNajeeb U [name], MD;  Location: [facility];  Service: Endoscopy;  Laterality: N/A;  1055  . ESOPHAGOGASTRODUODENOSCOPY (EGD) WITH ESOPHAGEAL DILATION  03/29/2012   Procedure: ESOPHAGOGASTRODUODENOSCOPY (EGD) WITH ESOPHAGEAL DILATION;  Surgeon: Malissa HippoNajeeb U [name], MD;  Location: [facility];  Service: Endoscopy;  Laterality: N/A;  730  . LEFT HEART CATH AND CORONARY ANGIOGRAPHY N/A 03/16/2017   Procedure: LEFT HEART CATH AND CORONARY ANGIOGRAPHY;  Surgeon: Lyn RecordsSmith, Henry W, MD;  Location: [facility] CV LAB;  Service: Cardiovascular;  Laterality: N/A;  . ULTRASOUND GUIDANCE FOR VASCULAR ACCESS  03/16/2017   Procedure: Ultrasound Guidance For Vascular Access;  Surgeon: Lyn RecordsSmith, Henry W, MD;  [facility];  Service: Cardiovascular;;     Medications: Current Meds  Medication Sig  . aspirin 81 MG chewable tablet Chew 1 tablet (81 mg total) by mouth daily.  [name]. atorvastatin (LIPITOR) 80 MG tablet Take 1 tablet (80 mg total)  by mouth daily at 6 PM.  . esomeprazole (NEXIUM) 20 MG capsule Take 20 mg by mouth daily as needed (acid reflux).   . Homeopathic Products (SIMILASAN PINK EYE RELIEF) SOLN Apply 1 drop to eye daily as needed (redness).  [name] ketotifen (ZADITOR) 0.025 % ophthalmic solution Place 2 drops into both eyes daily as needed (allergies).  [name] lisinopril (PRINIVIL,ZESTRIL) 5 MG tablet Take 10 mg by mouth every evening.  . Melatonin 2.5 MG CAPS Take 2.5 mg by mouth at bedtime as needed (sleep).  . metFORMIN (GLUCOPHAGE) 500 MG tablet Take 1,000 mg by mouth every evening.   . nitroGLYCERIN  (NITROSTAT) 0.4 MG SL tablet Place 1 tablet (0.4 mg total) under the tongue every 5 (five) minutes as needed.  . sildenafil (VIAGRA) 100 MG tablet Take 100 mg by mouth as needed.  . sitaGLIPtin-metformin (JANUMET) 50-1000 MG tablet Take 1 tablet by mouth daily.   . ticagrelor (BRILINTA) 90 MG TABS tablet Take 1 tablet (90 mg total) by mouth 2 (two) times daily.     Allergies: Allergies  Allergen Reactions  . Shellfish Allergy Swelling    Turns red  . Strawberry Extract Rash    Social History: The patient  reports that he has never smoked. He has never used smokeless tobacco. He reports that he does not drink alcohol or use drugs.   Family History: The patient's family history includes Diabetes in his father and mother; Heart disease in his mother; Kidney failure in his mother.   Review of Systems: Please see the history of present illness.   Otherwise, the review of systems is positive for none.   All other systems are reviewed and negative.   Physical Exam: VS:  BP 116/78   Pulse 97   Ht 5\' 11"  (1.803 m)   Wt 292 lb (132.5 kg)   SpO2 99%   BMI 40.73 kg/m  .  BMI Body mass index is 40.73 kg/m.  Wt Readings from Last 3 Encounters:  03/22/17 292 lb (132.5 kg)  03/16/17 288 lb (130.6 kg)  03/11/17 295 lb (133.8 kg)    General: Pleasant. Obese black male. Alert and in no acute distress.   HEENT: Normal.  Neck: Supple, no JVD, carotid bruits, or masses noted.  Cardiac: Regular rate and rhythm. Heart tones are distant. No edema.  Respiratory:  Lungs are clear to auscultation bilaterally with normal work of breathing.  GI: Soft and nontender.  MS: No deformity or atrophy. Gait and ROM intact.  Skin: Warm and dry. Color is normal.  Neuro:  Strength and sensation are intact and no gross focal deficits noted.  Psych: Alert, appropriate and with normal affect. Right wrist cath site ok. Does have a fading red rash on the right forearm.    LABORATORY DATA:  EKG:  EKG is not  ordered today.  Lab Results  Component Value Date   WBC 7.2 03/11/2017   HGB 13.8 03/11/2017   HCT 40.8 03/11/2017   PLT 360 03/11/2017   GLUCOSE 165 (H) 03/11/2017   NA 135 03/11/2017   K 4.2 03/11/2017   CL 98 (L) 03/11/2017   CREATININE 0.89 03/11/2017   BUN 12 03/11/2017   CO2 29 03/11/2017   TSH 0.38 03/23/2016   INR 0.93 03/11/2017     BNP (last 3 results) No results for input(s): BNP in the last 8760 hours.  ProBNP (last 3 results) No results for input(s): PROBNP in the last 8760 hours.   Other Studies Reviewed Today:  Procedures   CORONARY STENT INTERVENTION 03/2017  LEFT HEART CATH AND CORONARY ANGIOGRAPHY  Conclusion    Class III angina pectoris correlating with a 95% proximal to mid LAD stenosis. The LAD also contains eccentric proximal less than 50% narrowing.  Normal left main, circumflex, and right coronary.  Normal left ventricular systolic function, with mild elevation in LVEDP of 21 mmHg consistent with diastolic heart failure.  Successful culprit PCI of the proximal to mid LAD stenosis reducing the 95% obstruction to 0% with TIMI grade 3 flow using an 18 x 3.5 mm Exposed Dilated to 3.75 MmHg High Pressure.   RECOMMENDATIONS:  Aggressive risk factor modification: High intensity statin therapy 2 LDL less than 70, hemoglobin A1c less than 6.5, weight reduction, blood pressure control 130/85 mmHg or less, continue ACE inhibitor therapy, consider adding low-dose beta blocker therapy,, and exercise.  Same day discharge of no complications.  No work for 7 days and appropriate instruction concerning resuming activity.  Consider phase II cardiac rehabilitation for educational purposes.     Assessment/Plan:  1.  Chest Pain  - s/p recent cath with high grade proximal disease in the mid LAD - treated with single stent - on DAPT for at least 6 months. Doing well clinically. A little overwhelmed with the changes he knows he needs to make. Lab today.  Encouraged to cut back on current intake and slowly make changes. Will get his follow up in [place] on return per his request. Return to work next Monday October 29th due to his lifting requirements. Will see if we can refer to cardiac rehab at [facility].   2. HTN - BP ok today.   3. HLD - on statin - check LFTs today. Needs fasting lipids on return.   4. Obesity - discussed at length.   5. DM - reports his A1C is over 9 - he knows what he needs to do  6. OSA - encouraged to get back to wearing his CPAP.  7. Rash - ok to use cortisone/benadryl. Looks to be improving.    Current medicines are reviewed with the patient today.  The patient does not have concerns regarding medicines other than what has been noted above.  The following changes have been made:  See above.  Labs/ tests ordered today include:    Orders Placed This Encounter  Procedures  . Hepatic function panel  . Lipid panel  . Basic metabolic panel  . CBC     Disposition:   FU in the [place] office in about 4 to 6 weeks with fasting labs.   Patient is agreeable to this plan and will call if any problems develop in the interim.   SignedNorma Fredrickson, NP  03/22/2017 3:44 PM  [facility] [address] [place], [place]  [address] Phone: [phone] Fax: [phone]

## 2017-03-22 NOTE — Telephone Encounter (Signed)
Patient is aware of appt.

## 2017-03-22 NOTE — Telephone Encounter (Signed)
[facility] by Norma FredricksonLori Gerhardt NP. Placed referral for cardiac rehab for  patient per Norma FredricksonLori Gerhardt NP.

## 2017-03-22 NOTE — Patient Instructions (Addendum)
We will be checking the following labs today - BMET, HPF, and CBC   Medication Instructions:    Continue with your current medicines.     Testing/Procedures To Be Arranged:  N/A  Follow-Up:   See Jacolyn ReedyMichele Lenze, [place] in [place] in 4 to 6 weeks with fasting labs - Wednesday April 28, 2017 at 11:00 am      Other Special Instructions:   I will send a message about cardiac rehab in [place]  Think about what we talked about today    If you need a refill on your cardiac medications before your next appointment, please call your pharmacy.   Call the [facility] office at [phone] if you have any questions, problems or concerns.

## 2017-03-23 LAB — HEPATIC FUNCTION PANEL
ALT: 27 IU/L (ref 0–44)
AST: 19 IU/L (ref 0–40)
Albumin: 4.6 g/dL (ref 3.5–5.5)
Alkaline Phosphatase: 72 IU/L (ref 39–117)
Bilirubin Total: 0.4 mg/dL (ref 0.0–1.2)
Bilirubin, Direct: 0.12 mg/dL (ref 0.00–0.40)
Total Protein: 8.3 g/dL (ref 6.0–8.5)

## 2017-03-23 LAB — CBC
Hematocrit: 41.6 % (ref 37.5–51.0)
Hemoglobin: 14.2 g/dL (ref 13.0–17.7)
MCH: 27 pg (ref 26.6–33.0)
MCHC: 34.1 g/dL (ref 31.5–35.7)
MCV: 79 fL (ref 79–97)
Platelets: 483 10*3/uL — ABNORMAL HIGH (ref 150–379)
RBC: 5.25 x10E6/uL (ref 4.14–5.80)
RDW: 14.2 % (ref 12.3–15.4)
WBC: 11.6 10*3/uL — ABNORMAL HIGH (ref 3.4–10.8)

## 2017-03-23 LAB — BASIC METABOLIC PANEL
BUN/Creatinine Ratio: 14 (ref 9–20)
BUN: 14 mg/dL (ref 6–24)
CO2: 24 mmol/L (ref 20–29)
Calcium: 10.6 mg/dL — ABNORMAL HIGH (ref 8.7–10.2)
Chloride: 101 mmol/L (ref 96–106)
Creatinine, Ser: 0.97 mg/dL (ref 0.76–1.27)
GFR calc Af Amer: 106 mL/min/{1.73_m2} (ref >59)
GFR calc non Af Amer: 91 mL/min/{1.73_m2} (ref >59)
Glucose: 182 mg/dL — ABNORMAL HIGH (ref 65–99)
Potassium: 4.4 mmol/L (ref 3.5–5.2)
Sodium: 140 mmol/L (ref 134–144)

## 2017-03-23 LAB — LIPID PANEL
Chol/HDL Ratio: 3.2 ratio (ref 0.0–5.0)
Cholesterol, Total: 151 mg/dL (ref 100–199)
HDL: 47 mg/dL (ref >39)
LDL Calculated: 82 mg/dL (ref 0–99)
Triglycerides: 111 mg/dL (ref 0–149)
VLDL Cholesterol Cal: 22 mg/dL (ref 5–40)

## 2017-03-24 ENCOUNTER — Ambulatory Visit (INDEPENDENT_AMBULATORY_CARE_PROVIDER_SITE_OTHER): Payer: BLUE CROSS/BLUE SHIELD | Admitting: "Endocrinology

## 2017-03-24 ENCOUNTER — Encounter: Payer: Self-pay | Admitting: "Endocrinology

## 2017-03-24 VITALS — BP 130/79 | HR 94 | Ht 71.0 in | Wt 293.0 lb

## 2017-03-24 DIAGNOSIS — I10 Essential (primary) hypertension: Secondary | ICD-10-CM

## 2017-03-24 DIAGNOSIS — E1159 Type 2 diabetes mellitus with other circulatory complications: Secondary | ICD-10-CM | POA: Diagnosis not present

## 2017-03-24 DIAGNOSIS — E782 Mixed hyperlipidemia: Secondary | ICD-10-CM

## 2017-03-24 HISTORY — DX: Essential (primary) hypertension: I10

## 2017-03-24 NOTE — Progress Notes (Signed)
Subjective:    [name], male    DOB: 07/20/1965.  he is being seen in consultation for management of currently uncontrolled symptomatic diabetes requested by  Elfredia Nevins, MD.   Past Medical History:  Diagnosis Date  . CAD (coronary artery disease), native coronary artery    10/18 PCI/DES mLAD, normal EF on LV gram  . Diabetes mellitus    Type 2 diagnosed 2 weeks ago  . Hiatal hernia with gastroesophageal reflux   . Hypertension   . Mixed hyperlipidemia   . Morbid obesity (HCC)   . OSA on CPAP   . Urinary frequency    Past Surgical History:  Procedure Laterality Date  . CIRCUMCISION     1988  . CORONARY STENT INTERVENTION N/A 03/16/2017   Procedure: CORONARY STENT INTERVENTION;  Surgeon: Lyn Records, MD;  Location: [facility] INVASIVE CV LAB;  Service: Cardiovascular;  Laterality: N/A;  . ESOPHAGEAL DILATION N/A 02/08/2015   Procedure: ESOPHAGEAL DILATION;  Surgeon: Malissa Hippo, MD;  Location: [facility] ENDO SUITE;  Service: Endoscopy;  Laterality: N/A;  . ESOPHAGOGASTRODUODENOSCOPY N/A 02/08/2015   Procedure: ESOPHAGOGASTRODUODENOSCOPY (EGD);  Surgeon: Malissa Hippo, MD;  Location: [facility] SU[facility];  Service: Endoscopy;  Laterality: N/A;  1055  . ESOPHAGOGASTRODUODENOSCOPY (EGD) WITH ESOPHAGEAL DILATION  03/29/2012   Procedure: ESOPHAGOGASTRODUODENOSCOPY (EGD) WITH ESOPHAGEAL DILATION;  Surgeon: Malissa Hippo, MD;  Location: [facility];  Service: Endoscopy;  Laterality: N/A;  730  . LEFT HEART CATH AND CORONARY ANGIOGRAPHY N/A 03/16/2017   Procedure: LEFT HEART CATH AND CORONARY ANGIOGRAPHY;  Surgeon: Lyn Records, MD;  Location: [facility] INVASIVE CV LAB;  Service: Cardiovascular;  Laterality: N/A;  . ULTRASOUND GUIDANCE FOR VASCULAR ACCESS  03/16/2017   Procedure: Ultrasound Guidance For Vascular Access;  Surgeon: Lyn Records, MD;  Location: [facility] INVASIVE CV LAB;  Service: Cardiovascular;;   Social History   Social History  . Marital status: Married    Spouse  name: N/A  . Number of children: N/A  . Years of education: N/A   Social History Main Topics  . Smoking status: Never Smoker  . Smokeless tobacco: Never Used  . Alcohol use No  . Drug use: No  . Sexual activity: Not Asked   Other Topics Concern  . None   Social History Narrative   Drinks caffeine 4 times a week.      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   Outpatient Encounter Prescriptions as of 03/24/2017  Medication Sig  . aspirin 81 MG chewable tablet Chew 1 tablet (81 mg total) by mouth daily.  [name] atorvastatin (LIPITOR) 80 MG tablet Take 1 tablet (80 mg total) by mouth daily at 6 PM.  . esomeprazole (NEXIUM) 20 MG capsule Take 20 mg by mouth daily as needed (acid reflux).   [name] lisinopril (PRINIVIL,ZESTRIL) 5 MG tablet Take 10 mg by mouth every evening.  . Melatonin 2.5 MG CAPS Take 2.5 mg by mouth at bedtime as needed (sleep).  . metFORMIN (GLUCOPHAGE) 500 MG tablet Take 1,000 mg by mouth every evening.   . nitroGLYCERIN (NITROSTAT) 0.4 MG SL tablet Place 1 tablet (0.4 mg total) under the tongue every 5 (five) minutes as needed.  . sitaGLIPtin-metformin (JANUMET) 50-1000 MG tablet Take 1 tablet by mouth daily.   . ticagrelor (BRILINTA) 90 MG TABS tablet Take 1 tablet (90 mg total) by mouth 2 (two) times daily.  . Homeopathic Products (SIMILASAN PINK EYE RELIEF) SOLN Apply 1 drop to eye daily as needed (redness).  [name]  ketotifen (ZADITOR) 0.025 % ophthalmic solution Place 2 drops into both eyes daily as needed (allergies).  . sildenafil (VIAGRA) 100 MG tablet Take 100 mg by mouth as needed.   No facility-administered encounter medications on file as of 03/24/2017.     ALLERGIES: Allergies  Allergen Reactions  . Shellfish Allergy Swelling    Turns red  . Strawberry Extract Rash    VACCINATION STATUS:  There is no immunization history on file for this patient.  Diabetes  He presents for his initial diabetic visit. He has type 2 diabetes mellitus. Onset time: He reports that he was  diagnosed at approximate age of 51 years. His disease course has been worsening. There are no hypoglycemic associated symptoms. Pertinent negatives for hypoglycemia include no confusion, headaches, pallor, seizures or tremors. Associated symptoms include blurred vision and polyuria. Pertinent negatives for diabetes include no chest pain, no fatigue, no polydipsia, no polyphagia and no weakness. There are no hypoglycemic complications. Symptoms are worsening. Diabetic complications include heart disease. Risk factors for coronary artery disease include dyslipidemia, diabetes mellitus, family history, hypertension, male sex, obesity and sedentary lifestyle. Current diabetic treatment includes oral agent (dual therapy) (She is taking Janumet 50/1000 mg once a day, metformin 1000 mg once a day.). His weight is increasing steadily. He is following a generally unhealthy diet. When asked about meal planning, he reported none. He has not had a previous visit with a dietitian. He never participates in exercise. (He did not bring any meter nor logs to review today. He admits that he does not monitor blood glucose regularly.) An ACE inhibitor/angiotensin II receptor blocker is being taken. He does not see a podiatrist.Eye exam is not current.  Hyperlipidemia  This is a chronic problem. The current episode started more than 1 year ago. Exacerbating diseases include diabetes and obesity. Pertinent negatives include no chest pain, myalgias or shortness of breath. Current antihyperlipidemic treatment includes statins. Risk factors for coronary artery disease include dyslipidemia, diabetes mellitus, hypertension, male sex, obesity, a sedentary lifestyle and family history.  Hypertension  This is a chronic problem. The problem is controlled. Associated symptoms include blurred vision. Pertinent negatives include no chest pain, headaches, neck pain, palpitations or shortness of breath. Risk factors for coronary artery disease  include diabetes mellitus, dyslipidemia, obesity, male gender, sedentary lifestyle and family history. Past treatments include ACE inhibitors. Hypertensive end-organ damage includes CAD/MI.       Review of Systems  Constitutional: Negative for chills, fatigue, fever and unexpected weight change.  HENT: Negative for dental problem, mouth sores and trouble swallowing.   Eyes: Positive for blurred vision. Negative for visual disturbance.  Respiratory: Negative for cough, choking, chest tightness, shortness of breath and wheezing.   Cardiovascular: Negative for chest pain, palpitations and leg swelling.       No Shortness of breath  Gastrointestinal: Negative for abdominal distention, abdominal pain, constipation, diarrhea, nausea and vomiting.  Endocrine: Positive for polyuria. Negative for polydipsia and polyphagia.  Genitourinary: Negative for dysuria, flank pain, frequency, hematuria and urgency.  Musculoskeletal: Negative for back pain, gait problem, myalgias and neck pain.  Skin: Negative for pallor, rash and wound.  Neurological: Negative for tremors, seizures, syncope, weakness, numbness and headaches.  Hematological: Does not bruise/bleed easily.  Psychiatric/Behavioral: Negative.  Negative for confusion, dysphoric mood, hallucinations and suicidal ideas.    Objective:    BP 130/79   Pulse 94   Ht 5\' 11"  (1.803 m)   Wt 293 lb (132.9 kg)   BMI 40.87  kg/m   Wt Readings from Last 3 Encounters:  03/24/17 293 lb (132.9 kg)  03/22/17 292 lb (132.5 kg)  03/16/17 288 lb (130.6 kg)     Physical Exam  Constitutional: He is oriented to person, place, and time. He appears well-developed. He is cooperative. No distress.  HENT:  Head: Normocephalic and atraumatic.  Eyes: EOM are normal.  Neck: Normal range of motion. Neck supple. No tracheal deviation present. No thyromegaly present.  Cardiovascular: Normal rate, S1 normal, S2 normal and normal heart sounds.  Exam reveals no gallop.    No murmur heard. Pulses:      Dorsalis pedis pulses are 1+ on the right side, and 1+ on the left side.       Posterior tibial pulses are 1+ on the right side, and 1+ on the left side.  Pulmonary/Chest: Breath sounds normal. No respiratory distress. He has no wheezes.  Abdominal: Soft. Bowel sounds are normal. He exhibits no distension. There is no tenderness. There is no guarding and no CVA tenderness.  Musculoskeletal: He exhibits no edema.       Right shoulder: He exhibits no swelling and no deformity.  Neurological: He is alert and oriented to person, place, and time. He has normal strength and normal reflexes. No cranial nerve deficit or sensory deficit. Gait normal.  Skin: Skin is warm and dry. No rash noted. No cyanosis. Nails show no clubbing.  Psychiatric: His speech is normal. Thought content normal. Cognition and memory are normal.    CMP ( most recent) CMP     Component Value Date/Time   NA 140 03/22/2017 1549   K 4.4 03/22/2017 1549   CL 101 03/22/2017 1549   CO2 24 03/22/2017 1549   GLUCOSE 182 (H) 03/22/2017 1549   GLUCOSE 165 (H) 03/11/2017 1144   BUN 14 03/22/2017 1549   CREATININE 0.97 03/22/2017 1549   CALCIUM 10.6 (H) 03/22/2017 1549   PROT 8.3 03/22/2017 1549   ALBUMIN 4.6 03/22/2017 1549   AST 19 03/22/2017 1549   ALT 27 03/22/2017 1549   ALKPHOS 72 03/22/2017 1549   BILITOT 0.4 03/22/2017 1549   GFRNONAA 91 03/22/2017 1549   GFRAA 106 03/22/2017 1549     Diabetic Labs (most recent): Lab Results  Component Value Date   HGBA1C 10.8 02/23/2017     Lipid Panel ( most recent) Lipid Panel     Component Value Date/Time   CHOL 151 03/22/2017 1549   TRIG 111 03/22/2017 1549   HDL 47 03/22/2017 1549   CHOLHDL 3.2 03/22/2017 1549   LDLCALC 82 03/22/2017 1549     Assessment & Plan:   1. DM type 2 causing vascular disease (HCC)   - Patient has currently uncontrolled symptomatic type 2 DM since  51 years of age,  with most recent A1c of 10.8 %.  Recent labs reviewed.  -his diabetes is complicated by coronary artery disease with stent placement on 03/15/2017 and Joel Ward remains at a high risk for more acute and chronic complications which include CAD, CVA, CKD, retinopathy, and neuropathy. These are all discussed in detail with the patient.  - I have counseled him on diet management and weight loss, by adopting a carbohydrate restricted/protein rich diet.  - Suggestion is made for him to avoid simple carbohydrates  from his diet including Cakes, Sweet Desserts, Ice Cream, Soda (diet and regular), Sweet Tea, Candies, Chips, Cookies, Store Bought Juices, Alcohol in Excess of  1-2 drinks a day, Artificial Sweeteners, and "  Sugar-free" Products. This will help patient to have stable blood glucose profile and potentially avoid unintended weight gain.  - I encouraged him to switch to  unprocessed or minimally processed complex starch and increased protein intake (animal or plant source), fruits, and vegetables.  - he is advised to stick to a routine mealtimes to eat 3 meals  a day and avoid unnecessary snacks ( to snack only to correct hypoglycemia).   - he will be scheduled with Norm Salt, RDN, CDE for individualized DM education.  - I have approached him with the following individualized plan to manage diabetes and patient agrees:   - Given his current glycemic burden with A1c of 10.8%, he will likely need at least basal insulin initiation. - He appears hesitant this morning, however he is willing to initiate strict monitoring of blood glucose 4 times a day-before meals and at bedtime and return in 1 week with his meter and logs for reevaluation.  -Patient is encouraged to call clinic for blood glucose levels less than 70 or above 300 mg /dl. - I will continue metformin 1000 mg by mouth daily after supper, Janumet 50/1000 mg daily after breakfast, therapeutically suitable for patient . - Depending on his glycemic response  disorder medications will be readjusted to his mother Janumet only or metformin only, not both,  during his next visit.  - Patient specific target  A1c;  LDL, HDL, Triglycerides, and  Waist Circumference were discussed in detail.  2) BP/HTN: Controlled. Continue current medications including ACEI/ARB. 3) Lipids/HPL:   Controlled LDL 82.   Patient is advised to continue statins. 4)  Weight/Diet: CDE Consult will be initiated , exercise, and detailed carbohydrates information provided.  5) Chronic Care/Health Maintenance:  -he  is on ACEI/ARB and Statin medications and  is encouraged to continue to follow up with Ophthalmology, Dentist,  Podiatrist at least yearly or according to recommendations, and advised to  stay away from smoking. I have recommended yearly flu vaccine and pneumonia vaccination at least every 5 years; moderate intensity exercise for up to 150 minutes weekly; and  sleep for at least 7 hours a day.  - Time spent with the patient: 1 hour, of which >50% was spent in obtaining information about his symptoms, reviewing his previous labs, evaluations, and treatments, counseling him about his  currently complicated uncontrolled type 2 diabetes, hyperlipidemia, and hypertension, and developing a plan for long term treatment; he had a number of questions which I addressed.  - Patient to bring meter and  blood glucose logs during his next visit.  - I advised patient to maintain close follow up with Elfredia Nevins, MD for primary care needs.  Follow up plan: - Return in about 1 week (around 03/31/2017) for meter, and logs, follow up with meter and logs- no labs.  Marquis Lunch, MD Phone: [phone]  Fax: [phone]   03/24/2017, 1:04 PM This note was partially dictated with voice recognition software. Similar sounding words can be transcribed inadequately or may not  be corrected upon review.

## 2017-03-24 NOTE — Patient Instructions (Signed)

## 2017-03-29 ENCOUNTER — Ambulatory Visit: Payer: BLUE CROSS/BLUE SHIELD | Admitting: Nurse Practitioner

## 2017-03-31 ENCOUNTER — Ambulatory Visit (INDEPENDENT_AMBULATORY_CARE_PROVIDER_SITE_OTHER): Payer: BLUE CROSS/BLUE SHIELD | Admitting: "Endocrinology

## 2017-03-31 ENCOUNTER — Encounter: Payer: Self-pay | Admitting: "Endocrinology

## 2017-03-31 VITALS — BP 134/80 | HR 71 | Ht 71.0 in | Wt 286.0 lb

## 2017-03-31 DIAGNOSIS — E1159 Type 2 diabetes mellitus with other circulatory complications: Secondary | ICD-10-CM | POA: Diagnosis not present

## 2017-03-31 DIAGNOSIS — E782 Mixed hyperlipidemia: Secondary | ICD-10-CM | POA: Diagnosis not present

## 2017-03-31 DIAGNOSIS — I10 Essential (primary) hypertension: Secondary | ICD-10-CM

## 2017-03-31 DIAGNOSIS — E1165 Type 2 diabetes mellitus with hyperglycemia: Secondary | ICD-10-CM | POA: Diagnosis not present

## 2017-03-31 MED ORDER — SITAGLIPTIN PHOS-METFORMIN HCL 50-1000 MG PO TABS: 1.0000 | ORAL_TABLET | Freq: Two times a day (BID) | ORAL | 3 refills | Status: DC

## 2017-03-31 NOTE — Patient Instructions (Signed)

## 2017-03-31 NOTE — Progress Notes (Signed)
Subjective:    [name], male    DOB: 1966-04-09.  he is being seen in follow up  for management of currently uncontrolled symptomatic diabetes requested by  Elfredia Nevins, MD.   Past Medical History:  Diagnosis Date  . CAD (coronary artery disease), native coronary artery    10/18 PCI/DES mLAD, normal EF on LV gram  . Diabetes mellitus    Type 2 diagnosed 2 weeks ago  . Hiatal hernia with gastroesophageal reflux   . Hypertension   . Mixed hyperlipidemia   . Morbid obesity (HCC)   . OSA on CPAP   . Urinary frequency    Past Surgical History:  Procedure Laterality Date  . CIRCUMCISION     1988  . CORONARY STENT INTERVENTION N/A 03/16/2017   Procedure: CORONARY STENT INTERVENTION;  Surgeon: Lyn Records, MD;  Location: [facility] INVASIVE CV LAB;  Service: Cardiovascular;  Laterality: N/A;  . ESOPHAGEAL DILATION N/A 02/08/2015   Procedure: ESOPHAGEAL DILATION;  Surgeon: Malissa Hippo, MD;  Location: [facility] ENDO SUITE;  Service: Endoscopy;  Laterality: N/A;  . ESOPHAGOGASTRODUODENOSCOPY N/A 02/08/2015   Procedure: ESOPHAGOGASTRODUODENOSCOPY (EGD);  Surgeon: Malissa Hippo, MD;  Location: [facility] ENDO SUITE;  Service: Endoscopy;  Laterality: N/A;  1055  . ESOPHAGOGASTRODUODENOSCOPY (EGD) WITH ESOPHAGEAL DILATION  03/29/2012   Procedure: ESOPHAGOGASTRODUODENOSCOPY (EGD) WITH ESOPHAGEAL DILATION;  Surgeon: Malissa Hippo, MD;  Location: [facility] ENDO SUITE;  Service: Endoscopy;  Laterality: N/A;  730  . LEFT HEART CATH AND CORONARY ANGIOGRAPHY N/A 03/16/2017   Procedure: LEFT HEART CATH AND CORONARY ANGIOGRAPHY;  Surgeon: Lyn Records, MD;  Location: [facility] INVASIVE CV LAB;  Service: Cardiovascular;  Laterality: N/A;  . ULTRASOUND GUIDANCE FOR VASCULAR ACCESS  03/16/2017   Procedure: Ultrasound Guidance For Vascular Access;  Surgeon: Lyn Records, MD;  Location: [facility] INVASIVE CV LAB;  Service: Cardiovascular;;   Social History   Social History  . Marital status: Married    Spouse  name: N/A  . Number of children: N/A  . Years of education: N/A   Social History Main Topics  . Smoking status: Never Smoker  . Smokeless tobacco: Never Used  . Alcohol use No  . Drug use: No  . Sexual activity: Not Asked   Other Topics Concern  . None   Social History Narrative   Drinks caffeine 4 times a week.      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   Outpatient Encounter Prescriptions as of 03/31/2017  Medication Sig  . aspirin 81 MG chewable tablet Chew 1 tablet (81 mg total) by mouth daily.  [name] atorvastatin (LIPITOR) 80 MG tablet Take 1 tablet (80 mg total) by mouth daily at 6 PM.  . esomeprazole (NEXIUM) 20 MG capsule Take 20 mg by mouth daily as needed (acid reflux).   . Homeopathic Products (SIMILASAN PINK EYE RELIEF) SOLN Apply 1 drop to eye daily as needed (redness).  [name] ketotifen (ZADITOR) 0.025 % ophthalmic solution Place 2 drops into both eyes daily as needed (allergies).  [name] lisinopril (PRINIVIL,ZESTRIL) 5 MG tablet Take 10 mg by mouth every evening.  . Melatonin 2.5 MG CAPS Take 2.5 mg by mouth at bedtime as needed (sleep).  . nitroGLYCERIN (NITROSTAT) 0.4 MG SL tablet Place 1 tablet (0.4 mg total) under the tongue every 5 (five) minutes as needed.  . sildenafil (VIAGRA) 100 MG tablet Take 100 mg by mouth as needed.  . sitaGLIPtin-metformin (JANUMET) 50-1000 MG tablet Take 1 tablet by mouth 2 (two) times  daily with a meal.  . ticagrelor (BRILINTA) 90 MG TABS tablet Take 1 tablet (90 mg total) by mouth 2 (two) times daily.  . [DISCONTINUED] metFORMIN (GLUCOPHAGE) 500 MG tablet Take 1,000 mg by mouth every evening.   . [DISCONTINUED] sitaGLIPtin-metformin (JANUMET) 50-1000 MG tablet Take 1 tablet by mouth daily.    No facility-administered encounter medications on file as of 03/31/2017.     ALLERGIES: Allergies  Allergen Reactions  . Shellfish Allergy Swelling    Turns red  . Strawberry Extract Rash    VACCINATION STATUS:  There is no immunization history on file for  this patient.  Diabetes  He presents for his follow-up diabetic visit. He has type 2 diabetes mellitus. Onset time: He reports that he was diagnosed at approximate age of 6 years. His disease course has been improving. There are no hypoglycemic associated symptoms. Pertinent negatives for hypoglycemia include no confusion, headaches, pallor, seizures or tremors. Associated symptoms include blurred vision and polyuria. Pertinent negatives for diabetes include no chest pain, no fatigue, no polydipsia, no polyphagia and no weakness. There are no hypoglycemic complications. Symptoms are improving. Diabetic complications include heart disease. Risk factors for coronary artery disease include dyslipidemia, diabetes mellitus, family history, hypertension, male sex, obesity and sedentary lifestyle. Current diabetic treatment includes oral agent (dual therapy) (She is taking Janumet 50/1000 mg once a day, metformin 1000 mg once a day.). His weight is decreasing steadily. He is following a generally unhealthy diet. When asked about meal planning, he reported none. He has not had a previous visit with a dietitian. He never participates in exercise. His breakfast blood glucose range is generally 130-140 mg/dl. His lunch blood glucose range is generally 130-140 mg/dl. His dinner blood glucose range is generally 130-140 mg/dl. His bedtime blood glucose range is generally 130-140 mg/dl. His overall blood glucose range is 130-140 mg/dl. An ACE inhibitor/angiotensin II receptor blocker is being taken. He does not see a podiatrist.Eye exam is not current.  Hyperlipidemia  This is a chronic problem. The current episode started more than 1 year ago. Exacerbating diseases include diabetes and obesity. Pertinent negatives include no chest pain, myalgias or shortness of breath. Current antihyperlipidemic treatment includes statins. Risk factors for coronary artery disease include dyslipidemia, diabetes mellitus, hypertension, male  sex, obesity, a sedentary lifestyle and family history.  Hypertension  This is a chronic problem. The problem is controlled. Associated symptoms include blurred vision. Pertinent negatives include no chest pain, headaches, neck pain, palpitations or shortness of breath. Risk factors for coronary artery disease include diabetes mellitus, dyslipidemia, obesity, male gender, sedentary lifestyle and family history. Past treatments include ACE inhibitors. Hypertensive end-organ damage includes CAD/MI.     Review of Systems  Constitutional: Negative for chills, fatigue, fever and unexpected weight change.  HENT: Negative for dental problem, mouth sores and trouble swallowing.   Eyes: Positive for blurred vision. Negative for visual disturbance.  Respiratory: Negative for cough, choking, chest tightness, shortness of breath and wheezing.   Cardiovascular: Negative for chest pain, palpitations and leg swelling.       No Shortness of breath  Gastrointestinal: Negative for abdominal distention, abdominal pain, constipation, diarrhea, nausea and vomiting.  Endocrine: Positive for polyuria. Negative for polydipsia and polyphagia.  Genitourinary: Negative for dysuria, flank pain, frequency, hematuria and urgency.  Musculoskeletal: Negative for back pain, gait problem, myalgias and neck pain.  Skin: Negative for pallor, rash and wound.  Neurological: Negative for tremors, seizures, syncope, weakness, numbness and headaches.  Hematological: Does not  bruise/bleed easily.  Psychiatric/Behavioral: Negative.  Negative for confusion, dysphoric mood, hallucinations and suicidal ideas.    Objective:    BP 134/80   Pulse 71   Ht 5\' 11"  (1.803 m)   Wt 286 lb (129.7 kg)   BMI 39.89 kg/m   Wt Readings from Last 3 Encounters:  03/31/17 286 lb (129.7 kg)  03/24/17 293 lb (132.9 kg)  03/22/17 292 lb (132.5 kg)     Physical Exam  Constitutional: He is oriented to person, place, and time. He appears  well-developed. He is cooperative. No distress.  HENT:  Head: Normocephalic and atraumatic.  Eyes: EOM are normal.  Neck: Normal range of motion. Neck supple. No tracheal deviation present. No thyromegaly present.  Cardiovascular: Normal rate, S1 normal, S2 normal and normal heart sounds.  Exam reveals no gallop.   No murmur heard. Pulses:      Dorsalis pedis pulses are 1+ on the right side, and 1+ on the left side.       Posterior tibial pulses are 1+ on the right side, and 1+ on the left side.  Pulmonary/Chest: Breath sounds normal. No respiratory distress. He has no wheezes.  Abdominal: Soft. Bowel sounds are normal. He exhibits no distension. There is no tenderness. There is no guarding and no CVA tenderness.  Musculoskeletal: He exhibits no edema.       Right shoulder: He exhibits no swelling and no deformity.  Neurological: He is alert and oriented to person, place, and time. He has normal strength and normal reflexes. No cranial nerve deficit or sensory deficit. Gait normal.  Skin: Skin is warm and dry. No rash noted. No cyanosis. Nails show no clubbing.  Psychiatric: His speech is normal. Thought content normal. Cognition and memory are normal.    CMP ( most recent) CMP     Component Value Date/Time   NA 140 03/22/2017 1549   K 4.4 03/22/2017 1549   CL 101 03/22/2017 1549   CO2 24 03/22/2017 1549   GLUCOSE 182 (H) 03/22/2017 1549   GLUCOSE 165 (H) 03/11/2017 1144   BUN 14 03/22/2017 1549   CREATININE 0.97 03/22/2017 1549   CALCIUM 10.6 (H) 03/22/2017 1549   PROT 8.3 03/22/2017 1549   ALBUMIN 4.6 03/22/2017 1549   AST 19 03/22/2017 1549   ALT 27 03/22/2017 1549   ALKPHOS 72 03/22/2017 1549   BILITOT 0.4 03/22/2017 1549   GFRNONAA 91 03/22/2017 1549   GFRAA 106 03/22/2017 1549     Diabetic Labs (most recent): Lab Results  Component Value Date   HGBA1C 10.8 02/23/2017     Lipid Panel ( most recent) Lipid Panel     Component Value Date/Time   CHOL 151  03/22/2017 1549   TRIG 111 03/22/2017 1549   HDL 47 03/22/2017 1549   CHOLHDL 3.2 03/22/2017 1549   LDLCALC 82 03/22/2017 1549     Assessment & Plan:   1. DM type 2 causing vascular disease (HCC)   - Patient has currently uncontrolled symptomatic type 2 DM since  51 years of age. He came with 90% target range glycemia since last visit. his most recent A1c of 10.8 %. Recent labs reviewed.  -his diabetes is complicated by coronary artery disease with stent placement on 03/15/2017 and LATRON RIBAS remains at a high risk for more acute and chronic complications which include CAD, CVA, CKD, retinopathy, and neuropathy. These are all discussed in detail with the patient.  - I have counseled him on diet management  and weight loss, by adopting a carbohydrate restricted/protein rich diet.  -  Suggestion is made for him to avoid simple carbohydrates  from his diet including Cakes, Sweet Desserts / Pastries, Ice Cream, Soda (diet and regular), Sweet Tea, Candies, Chips, Cookies, Store Bought Juices, Alcohol in Excess of  1-2 drinks a day, Artificial Sweeteners, and "Sugar-free" Products. This will help patient to have stable blood glucose profile and potentially avoid unintended weight gain.   - I encouraged him to switch to  unprocessed or minimally processed complex starch and increased protein intake (animal or plant source), fruits, and vegetables.  - he is advised to stick to a routine mealtimes to eat 3 meals  a day and avoid unnecessary snacks ( to snack only to correct hypoglycemia).   - he will be scheduled with Norm SaltPenny Crumpton, RDN, CDE for individualized DM education- consult pending.  - I have approached him with the following individualized plan to manage diabetes and patient agrees:   - Despite his recent A1c of 10.8%, he came with remarkable improvement in his glycemia by lifesyle modification and weight loss. -he will not require  insulin initiation.  - I will continue   Janumet 50/1000 mg twice a day after meals, d/c metformin.  - Patient specific target  A1c;  LDL, HDL, Triglycerides, and  Waist Circumference were discussed in detail.  2) BP/HTN: Controlled. Continue current medications including ACEI/ARB. 3) Lipids/HPL:   Controlled LDL 82.   Patient is advised to continue statins. 4)  Weight/Diet: CDE Consult has been initiated , exercise, and detailed carbohydrates information provided.  5) Chronic Care/Health Maintenance:  -he  is on ACEI/ARB and Statin medications and  is encouraged to continue to follow up with Ophthalmology, Dentist,  Podiatrist at least yearly or according to recommendations, and advised to  stay away from smoking. I have recommended yearly flu vaccine and pneumonia vaccination at least every 5 years; moderate intensity exercise for up to 150 minutes weekly; and  sleep for at least 7 hours a day.  - Time spent with the patient: 25 min, of which >50% was spent in reviewing his sugar logs , discussing his hypo- and hyper-glycemic episodes, reviewing his current and  previous labs and insulin doses and developing a plan to avoid hypo- and hyper-glycemia.   - I advised patient to maintain close follow up with Elfredia NevinsFusco, Lawrence, MD for primary care needs.  Follow up plan: - Return in about 3 months (around 07/01/2017) for follow up with meter and logs- no labs.  Marquis LunchGebre Spruha Weight, MD Phone: [phone]  Fax: [phone]   03/31/2017, 10:30 AM This note was partially dictated with voice recognition software. Similar sounding words can be transcribed inadequately or may not  be corrected upon review.

## 2017-04-06 ENCOUNTER — Ambulatory Visit: Payer: BLUE CROSS/BLUE SHIELD | Admitting: Dietician

## 2017-04-07 ENCOUNTER — Encounter: Payer: BLUE CROSS/BLUE SHIELD | Attending: "Endocrinology | Admitting: Skilled Nursing Facility1

## 2017-04-07 ENCOUNTER — Encounter: Payer: Self-pay | Admitting: Skilled Nursing Facility1

## 2017-04-07 DIAGNOSIS — E1159 Type 2 diabetes mellitus with other circulatory complications: Secondary | ICD-10-CM | POA: Diagnosis not present

## 2017-04-07 DIAGNOSIS — Z6839 Body mass index (BMI) 39.0-39.9, adult: Secondary | ICD-10-CM | POA: Insufficient documentation

## 2017-04-07 DIAGNOSIS — Z713 Dietary counseling and surveillance: Secondary | ICD-10-CM | POA: Insufficient documentation

## 2017-04-07 NOTE — Progress Notes (Signed)
Diabetes Self-Management Education  Visit Type: First/Initial  04/07/2017  [name], identified by name and date of birth, is a 51 y.o. male with a diagnosis of Diabetes: Type 2.   ASSESSMENT  Height 5\' 11"  (1.803 m). Body mass index is 39.89 kg/m.  Pt states he checks his blood sugar when he gets up and before each meal. Pt states he is now seeing an endocrinologist. Fasting 71-180. Pt states he is always tired. Pt states he has just had a stent put in. Pt had many relevant questions.  Diabetes Self-Management Education - 04/07/17 1051      Visit Information   Visit Type  First/Initial      Initial Visit   Diabetes Type  Type 2    Are you currently following a meal plan?  No    Are you taking your medications as prescribed?  Yes      Health Coping   How would you rate your overall health?  Fair      Psychosocial Assessment   Patient Belief/Attitude about Diabetes  Motivated to manage diabetes    Self-care barriers  None    Self-management support  Family    Other persons present  Spouse/SO    Patient Concerns  Nutrition/Meal planning    Special Needs  None    Learning Readiness  Ready      Pre-Education Assessment   Patient understands the diabetes disease and treatment process.  Needs Instruction    Patient understands incorporating nutritional management into lifestyle.  Needs Instruction    Patient undertands incorporating physical activity into lifestyle.  Needs Instruction    Patient understands using medications safely.  Needs Instruction    Patient understands monitoring blood glucose, interpreting and using results  Needs Instruction    Patient understands prevention, detection, and treatment of acute complications.  Needs Instruction    Patient understands prevention, detection, and treatment of chronic complications.  Needs Instruction    Patient understands how to develop strategies to address psychosocial issues.  Needs Instruction    Patient  understands how to develop strategies to promote health/change behavior.  Needs Instruction      Complications   Last HgB A1C per patient/outside source  9 %    How often do you check your blood sugar?  3-4 times/day    Fasting Blood glucose range (mg/dL)  47-829;[phone];130-179    Postprandial Blood glucose range (mg/dL)  [phone]    Number of hypoglycemic episodes per month  1    Can you tell when your blood sugar is low?  No    Have you had a dilated eye exam in the past 12 months?  Yes    Have you had a dental exam in the past 12 months?  Yes    Are you checking your feet?  Yes    How many days per week are you checking your feet?  4      Dietary Intake   Breakfast  trukey sausage cheese and boiled egg    Lunch  chef salad somtimes meat someimtes not with ranch dressing    Dinner  1 boiled     Beverage(s)  water with lemon with some sweetners 2 gallons       Exercise   Exercise Type  Light (walking / raking leaves)      Patient Education   Previous Diabetes Education  No    Disease state   Definition of diabetes, type 1 and 2, and the  diagnosis of diabetes;Factors that contribute to the development of diabetes    Nutrition management   Role of diet in the treatment of diabetes and the relationship between the three main macronutrients and blood glucose level;Carbohydrate counting;Food label reading, portion sizes and measuring food.;Reviewed blood glucose goals for pre and post meals and how to evaluate the patients' food intake on their blood glucose level.    Physical activity and exercise   Role of exercise on diabetes management, blood pressure control and cardiac health.    Monitoring  Purpose and frequency of SMBG.;Yearly dilated eye exam;Daily foot exams    Acute complications  Taught treatment of hypoglycemia - the 15 rule.    Chronic complications  Assessed and discussed foot care and prevention of foot problems;Retinopathy and reason for yearly dilated eye exams;Reviewed with  patient heart disease, higher risk of, and prevention    Psychosocial adjustment  Role of stress on diabetes;Worked with patient to identify barriers to care and solutions      Individualized Goals (developed by patient)   Nutrition  Follow meal plan discussed;General guidelines for healthy choices and portions discussed    Physical Activity  Exercise 5-7 days per week;30 minutes per day    Medications  take my medication as prescribed    Reducing Risk  examine blood glucose patterns      Post-Education Assessment   Patient understands the diabetes disease and treatment process.  Demonstrates understanding / competency    Patient understands incorporating nutritional management into lifestyle.  Demonstrates understanding / competency    Patient undertands incorporating physical activity into lifestyle.  Demonstrates understanding / competency    Patient understands using medications safely.  Demonstrates understanding / competency    Patient understands monitoring blood glucose, interpreting and using results  Demonstrates understanding / competency    Patient understands prevention, detection, and treatment of acute complications.  Demonstrates understanding / competency    Patient understands prevention, detection, and treatment of chronic complications.  Demonstrates understanding / competency    Patient understands how to develop strategies to address psychosocial issues.  Demonstrates understanding / competency    Patient understands how to develop strategies to promote health/change behavior.  Demonstrates understanding / competency      Outcomes   Expected Outcomes  Demonstrated interest in learning. Expect positive outcomes    Future DMSE  PRN    Program Status  Completed       Individualized Plan for Diabetes Self-Management Training:   Learning Objective:  Patient will have a greater understanding of diabetes self-management. Patient education plan is to attend individual  and/or group sessions per assessed needs and concerns.   Plan:   There are no Patient Instructions on file for this visit.  Expected Outcomes:  Demonstrated interest in learning. Expect positive outcomes  Education material provided: Living Well with Diabetes, Meal plan card, My Plate and Snack sheet  If problems or questions, patient to contact team via:  Phone  Future DSME appointment: PRN

## 2017-04-27 NOTE — Progress Notes (Signed)
Cardiology Office Note    Date:  04/28/2017   ID:  Joel Ward, DOB 1966-02-22, MRN [id number]  PCP:  Elfredia Nevins, MD  Cardiologist: Dr. Eden Emms  Chief Complaint  Patient presents with  . Follow-up    History of Present Illness:  Joel Ward is a 51 y.o. male with CAD status post DES to the LAD 03/16/2017, hypertension, HLD, DM, morbid obesity, OSA on CPAP, HH with GERD patient saw Joel Russel, NP 03/22/17 and he was doing well.  He wants to transition his care to [facility].  Labs checked and LDL 82 LFTs stable chemistries look good.  Patient comes in today accompanied by his wife.  He does a lot of heavy lifting at work and overall has done well.  Occasionally he gets short of breath if he bends over but it is quick and short lived.  He denies any chest tightness, pressure, dyspnea on exertion, dizziness or presyncope.  He is being very careful with his diet and his sugars are in the 115 range average.  He does not exercise outside of work.  He is lost about 28 pounds since his stent.  He is afraid to use his CPAP machine.    Past Medical History:  Diagnosis Date  . CAD (coronary artery disease), native coronary artery    10/18 PCI/DES mLAD, normal EF on LV gram  . Diabetes mellitus    Type 2 diagnosed 2 weeks ago  . Hiatal hernia with gastroesophageal reflux   . Hypertension   . Mixed hyperlipidemia   . Morbid obesity (HCC)   . OSA on CPAP   . Urinary frequency     Past Surgical History:  Procedure Laterality Date  . CIRCUMCISION     1988  . CORONARY STENT INTERVENTION N/A 03/16/2017   Procedure: CORONARY STENT INTERVENTION;  Surgeon: Joel Records, MD;  Location: [facility] INVASIVE CV LAB;  Service: Cardiovascular;  Laterality: N/A;  . ESOPHAGEAL DILATION N/A 02/08/2015   Procedure: ESOPHAGEAL DILATION;  Surgeon: Joel Hippo, MD;  [facility];  Service: Endoscopy;  Laterality: N/A;  . ESOPHAGOGASTRODUODENOSCOPY N/A 02/08/2015   Procedure:  ESOPHAGOGASTRODUODENOSCOPY (EGD);  Surgeon: Joel Hippo, MD;  Location: [facility];  Service: Endoscopy;  Laterality: N/A;  1055  . ESOPHAGOGASTRODUODENOSCOPY (EGD) WITH ESOPHAGEAL DILATION  03/29/2012   Procedure: ESOPHAGOGASTRODUODENOSCOPY (EGD) WITH ESOPHAGEAL DILATION;  Surgeon: Joel Hippo, MD;  [facility]: [facility];  Service: Endoscopy;  Laterality: N/A;  730  . LEFT HEART CATH AND CORONARY ANGIOGRAPHY N/A 03/16/2017   Procedure: LEFT HEART CATH AND CORONARY ANGIOGRAPHY;  Surgeon: Joel Records, MD;  Location: [facility] INVASIVE CV LAB;  Service: Cardiovascular;  Laterality: N/A;  . ULTRASOUND GUIDANCE FOR VASCULAR ACCESS  03/16/2017   Procedure: Ultrasound Guidance For Vascular Access;  Surgeon: Joel Records, MD;  Location: [facility] INVASIVE CV LAB;  Service: Cardiovascular;;    Current Medications: Current Meds  Medication Sig  . aspirin 81 MG chewable tablet Chew 1 tablet (81 mg total) by mouth daily.  [name] atorvastatin (LIPITOR) 80 MG tablet Take 1 tablet (80 mg total) by mouth daily at 6 PM.  . esomeprazole (NEXIUM) 20 MG capsule Take 20 mg by mouth daily as needed (acid reflux).   . Homeopathic Products (SIMILASAN PINK EYE RELIEF) SOLN Apply 1 drop to eye daily as needed (redness).  [name] ketotifen (ZADITOR) 0.025 % ophthalmic solution Place 2 drops into both eyes daily as needed (allergies).  . Melatonin 2.5 MG  CAPS Take 2.5 mg by mouth at bedtime as needed (sleep).  . nitroGLYCERIN (NITROSTAT) 0.4 MG SL tablet Place 1 tablet (0.4 mg total) under the tongue every 5 (five) minutes as needed.  . sildenafil (VIAGRA) 100 MG tablet Take 100 mg by mouth as needed.  . ticagrelor (BRILINTA) 90 MG TABS tablet Take 1 tablet (90 mg total) by mouth 2 (two) times daily.  . [DISCONTINUED] lisinopril (PRINIVIL,ZESTRIL) 5 MG tablet Take 10 mg by mouth every evening.     Allergies:   Shellfish allergy and Strawberry extract   Social History   Socioeconomic History  . Marital status: Married     Spouse name: None  . Number of children: None  . Years of education: None  . Highest education level: None  Social Needs  . Financial resource strain: None  . Food insecurity - worry: None  . Food insecurity - inability: None  . Transportation needs - medical: None  . Transportation needs - non-medical: None  Occupational History  . None  Tobacco Use  . Smoking status: Never Smoker  . Smokeless tobacco: Never Used  Substance and Sexual Activity  . Alcohol use: No  . Drug use: No  . Sexual activity: None  Other Topics Concern  . None  Social History Narrative   Drinks caffeine 4 times a week.      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     Family History:  The patient's family history includes Diabetes in his father and mother; Heart disease in his mother; Kidney failure in his mother.   ROS:   Please see the history of present illness.    Review of Systems  Constitution: Negative.  HENT: Negative.   Cardiovascular: Negative.   Respiratory: Negative.   Endocrine: Negative.   Hematologic/Lymphatic: Negative.   Musculoskeletal: Negative.   Gastrointestinal: Negative.   Genitourinary: Negative.   Neurological: Negative.    All other systems reviewed and are negative.   PHYSICAL EXAM:   VS:  BP 128/80   Pulse 69   Ht 5\' 11"  (1.803 m)   Wt 278 lb (126.1 kg)   SpO2 95%   BMI 38.77 kg/m   Physical Exam  GEN: Obese, in no acute distress  Neck: no JVD, carotid bruits, or masses Cardiac:RRR; no murmurs, rubs, or gallops  Respiratory:  clear to auscultation bilaterally, normal work of breathing GI: soft, nontender, nondistended, + BS Ext: without cyanosis, clubbing, or edema, Good distal pulses bilaterally Neuro:  Alert and Oriented x 3, Psych: euthymic mood, full affect  Wt Readings from Last 3 Encounters:  04/28/17 278 lb (126.1 kg)  03/31/17 286 lb (129.7 kg)  03/24/17 293 lb (132.9 kg)      Studies/Labs Reviewed:   EKG:  EKG is not ordered today.  Recent  Labs: 03/22/2017: ALT 27; BUN 14; Creatinine, Ser 0.97; Hemoglobin 14.2; Platelets 483; Potassium 4.4; Sodium 140   Lipid Panel    Component Value Date/Time   CHOL 151 03/22/2017 1549   TRIG 111 03/22/2017 1549   HDL 47 03/22/2017 1549   CHOLHDL 3.2 03/22/2017 1549   LDLCALC 82 03/22/2017 1549    Additional studies/ Ward that were reviewed today include:  CORONARY STENT INTERVENTION 03/2017  LEFT HEART CATH AND CORONARY ANGIOGRAPHY  Conclusion     Class III angina pectoris correlating with a 95% proximal to mid LAD stenosis. The LAD also contains eccentric proximal less than 50% narrowing.  Normal left main, circumflex, and right coronary.  Normal left ventricular systolic  function, with mild elevation in LVEDP of 21 mmHg consistent with diastolic heart failure.  Successful culprit PCI of the proximal to mid LAD stenosis reducing the 95% obstruction to 0% with TIMI grade 3 flow using an 18 x 3.5 mm Exposed Dilated to 3.75 MmHg High Pressure.     RECOMMENDATIONS:  Aggressive risk factor modification: High intensity statin therapy 2 LDL less than 70, hemoglobin A1c less than 6.5, weight reduction, blood pressure control 130/85 mmHg or less, continue ACE inhibitor therapy, consider adding low-dose beta blocker therapy,, and exercise.  Same day discharge of no complications.  No work for 7 days and appropriate instruction concerning resuming activity.  Consider phase II cardiac rehabilitation for educational purposes.            ASSESSMENT:    1. Coronary artery disease involving native coronary artery of native heart with unstable angina pectoris (HCC)   2. Essential hypertension, benign   3. DM type 2 causing vascular disease (HCC)   4. Mixed hyperlipidemia      PLAN:  In order of problems listed above:  CAD status post DES to the LAD 03/2017 otherwise normal coronary arteries and normal LV function, no angina.  Continue Brilinta, aspirin and Lipitor.   [place] 2 [place]  Essential hypertension blood pressure well controlled on lisinopril 10 mg daily.  Is not on a beta-blocker but will hold off at this time.  DM type II Hgb A1c 10.8 on 02/23/17 glucose average 115  Hyperlipidemia on lipitor scheduled for fasting lipid panel  Obstructive sleep apnea not compliant with CPAP, still not using it.  Recommend he use his CPAP.  Medication Adjustments/Labs and Tests Ordered: Current medicines are reviewed at length with the patient today.  Concerns regarding medicines are outlined above.  Medication changes, Labs and Tests ordered today are listed in the Patient Instructions below. Patient Instructions  Medication Instructions:  Your physician recommends that you continue on your current medications as directed. Please refer to the Current Medication list given to you today.   Labwork: NONE   Testing/Procedures: NONE   Follow-Up: Your physician recommends that you schedule a follow-up appointment in: 2 Months    Any Other Special Instructions Will Be Listed Below (If Applicable).     If you need a refill on your cardiac medications before your next appointment, please call your pharmacy.  Thank you for choosing [facility]!      Elson ClanSigned, Abbigal Radich, PA-C  04/28/2017 11:23 AM    [facility] [address], [place], [place]  [address] Phone: [phone]) [phone]; Fax: [phone]

## 2017-04-28 ENCOUNTER — Ambulatory Visit: Payer: BLUE CROSS/BLUE SHIELD | Admitting: Physician Assistant

## 2017-04-28 ENCOUNTER — Encounter: Payer: Self-pay | Admitting: Physician Assistant

## 2017-04-28 VITALS — BP 128/80 | HR 69 | Ht 71.0 in | Wt 278.0 lb

## 2017-04-28 DIAGNOSIS — E782 Mixed hyperlipidemia: Secondary | ICD-10-CM | POA: Diagnosis not present

## 2017-04-28 DIAGNOSIS — E1159 Type 2 diabetes mellitus with other circulatory complications: Secondary | ICD-10-CM | POA: Diagnosis not present

## 2017-04-28 DIAGNOSIS — I25110 Atherosclerotic heart disease of native coronary artery with unstable angina pectoris: Secondary | ICD-10-CM | POA: Diagnosis not present

## 2017-04-28 DIAGNOSIS — I10 Essential (primary) hypertension: Secondary | ICD-10-CM

## 2017-04-28 MED ORDER — LISINOPRIL 10 MG PO TABS: 10.0000 mg | ORAL_TABLET | Freq: Every day | ORAL | 3 refills | Status: DC

## 2017-04-28 NOTE — Patient Instructions (Signed)
Medication Instructions:  Your physician recommends that you continue on your current medications as directed. Please refer to the Current Medication list given to you today.   Labwork: NONE  Testing/Procedures: NONE  Follow-Up: Your physician recommends that you schedule a follow-up appointment in: 2 Months   Any Other Special Instructions Will Be Listed Below (If Applicable).     If you need a refill on your cardiac medications before your next appointment, please call your pharmacy.  Thank you for choosing [facility]!   

## 2017-06-27 NOTE — Progress Notes (Signed)
Cardiology Office Note    Date:  06/28/2017   ID:  Joel Ward, DOB 1965-09-25, MRN [id number]  PCP:  Elfredia Nevins, MD  Cardiologist: Dr. Eden Emms   Chief Complaint  Patient presents with  . Follow-up    2 month visit    History of Present Illness:    Joel Ward is a 52 y.o. male with past medical history of CAD (s/p PCI/DES to mid-LAD in 03/2017, HTN, HLD, Type 2 DM, and OSA who presents to the office today for 28-month follow-up.   He was last examined by Jacolyn Reedy, PA-C on 04/28/2017 and reported having occasional shortness of breath when bending over but denied any chest discomfort or dyspnea on exertion.  He reported being physically active at work and had successfully lost over 28 pounds since he had required stent placement.  He was continued on aspirin, Brilinta, and Lipitor at that time.  In talking with the patient reports doing well since his last office visit. Accompanied by his wife today. He denies any exertional chest discomfort or dyspnea on exertion. 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 He does develop some associated back discomfort with the lifting activities but denies any symptoms that are similar to when he required prior stent placement.  He denies any recent orthopnea, PND, or lower extremity edema. He does not check blood pressure regularly but it is well controlled at 118/76 during today's visit. Does experience easy bruising while on DAPT therapy. No melena, hematochezia, or hematuria.   He has continued to follow a strict diet and has lost over 40 pounds within the past year. Weight has declined by 12 pounds since 04/2017.  He reports having accomplished this with dietary changes and increased activity.   Past Medical History:  Diagnosis Date  . CAD (coronary artery disease), native coronary artery    10/18 PCI/DES mLAD, normal EF on LV gram  . Diabetes mellitus    Type 2 diagnosed 2 weeks ago  . Hiatal hernia with gastroesophageal reflux   . Hypertension   . Mixed hyperlipidemia   . Morbid obesity (HCC)   . OSA on CPAP   . Urinary frequency     Past Surgical History:  Procedure Laterality Date  . CIRCUMCISION     1988  . CORONARY STENT INTERVENTION N/A 03/16/2017   Procedure: CORONARY STENT INTERVENTION;  Surgeon: Lyn Records, MD;  Location: [facility] INVASIVE CV LAB;  Service: Cardiovascular;  Laterality: N/A;  . ESOPHAGEAL DILATION N/A 02/08/2015   Procedure: ESOPHAGEAL DILATION;  Surgeon: Malissa Hippo, MD;  [facility];  Service: Endoscopy;  Laterality: N/A;  . ESOPHAGOGASTRODUODENOSCOPY N/A 02/08/2015   Procedure: ESOPHAGOGASTRODUODENOSCOPY (EGD);  Surgeon: Malissa Hippo, MD;  [facility]: [facility];  Service: Endoscopy;  Laterality: N/A;  1055  . ESOPHAGOGASTRODUODENOSCOPY (EGD) WITH ESOPHAGEAL DILATION  03/29/2012   Procedure: ESOPHAGOGASTRODUODENOSCOPY (EGD) WITH ESOPHAGEAL DILATION;  Surgeon: Malissa Hippo, MD;  [facility]: [facility];  Service: Endoscopy;  Laterality: N/A;  730  . LEFT HEART CATH AND CORONARY ANGIOGRAPHY N/A 03/16/2017   Procedure: LEFT HEART CATH AND CORONARY ANGIOGRAPHY;  Surgeon: Lyn Records, MD;  Location: [facility] INVASIVE CV LAB;  Service: Cardiovascular;  Laterality: N/A;  . ULTRASOUND GUIDANCE FOR VASCULAR ACCESS  03/16/2017   Procedure: Ultrasound Guidance For Vascular Access;  Surgeon: Lyn Records, MD;  Location: [facility] INVASIVE CV LAB;  Service: Cardiovascular;;  Current Medications: Outpatient Medications Prior to Visit  Medication Sig Dispense Refill  . aspirin 81 MG chewable tablet Chew 1 tablet (81 mg total) by mouth daily. 30 tablet 0  . atorvastatin (LIPITOR) 80 MG tablet Take 1 tablet (80 mg total) by mouth daily at 6 PM. 90 tablet 1  . esomeprazole (NEXIUM) 20 MG capsule Take 20 mg by mouth daily as needed (acid reflux).     . Homeopathic Products (SIMILASAN PINK EYE RELIEF) SOLN Apply 1  drop to eye daily as needed (redness).    [name] ketotifen (ZADITOR) 0.025 % ophthalmic solution Place 2 drops into both eyes daily as needed (allergies).    [name] lisinopril (PRINIVIL,ZESTRIL) 10 MG tablet Take 1 tablet (10 mg total) by mouth daily. 90 tablet 3  . Melatonin 2.5 MG CAPS Take 2.5 mg by mouth at bedtime as needed (sleep).    . nitroGLYCERIN (NITROSTAT) 0.4 MG SL tablet Place 1 tablet (0.4 mg total) under the tongue every 5 (five) minutes as needed. 25 tablet 2  . sildenafil (VIAGRA) 100 MG tablet Take 100 mg by mouth as needed.    . ticagrelor (BRILINTA) 90 MG TABS tablet Take 1 tablet (90 mg total) by mouth 2 (two) times daily. 180 tablet 1   No facility-administered medications prior to visit.      Allergies:   Shellfish allergy and Strawberry extract   Social History   Socioeconomic History  . Marital status: Married    Spouse name: None  . Number of children: None  . Years of education: None  . Highest education level: None  Social Needs  . Financial resource strain: None  . Food insecurity - worry: None  . Food insecurity - inability: None  . Transportation needs - medical: None  . Transportation needs - non-medical: None  Occupational History  . None  Tobacco Use  . Smoking status: Never Smoker  . Smokeless tobacco: Never Used  Substance and Sexual Activity  . Alcohol use: No  . Drug use: No  . Sexual activity: None  Other Topics Concern  . None  Social History Narrative   Drinks caffeine 4 times a week.      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     Family History:  The patient's family history includes Diabetes in his father and mother; Heart disease in his mother; Kidney failure in his mother.   Review of Systems:   Please see the history of present illness.     General:  No chills, fever or night sweats. Positive for weight loss (intentional).  Cardiovascular:  No chest pain, dyspnea on exertion, edema, orthopnea, palpitations, paroxysmal nocturnal  dyspnea. Dermatological: No rash, lesions/masses Respiratory: No cough, dyspnea Urologic: No hematuria, dysuria Abdominal:   No nausea, vomiting, diarrhea, bright red blood per rectum, melena, or hematemesis Neurologic:  No visual changes, wkns, changes in mental status. All other systems reviewed and are otherwise negative except as noted above.   Physical Exam:    VS:  BP 118/76   Pulse 83   Ht 5\' 11"  (1.803 m)   Wt 266 lb (120.7 kg)   SpO2 95%   BMI 37.10 kg/m    General: Well developed, well nourished [place] male appearing in no acute distress. Head: Normocephalic, atraumatic, sclera non-icteric, no xanthomas, nares are without discharge.  Neck: No carotid bruits. JVD not elevated.  Lungs: Respirations regular and unlabored, without wheezes or rales.  Heart: Regular rate and rhythm. No S3 or S4.  No murmur,  no rubs, or gallops appreciated. Abdomen: Soft, non-tender, non-distended with normoactive bowel sounds. No hepatomegaly. No rebound/guarding. No obvious abdominal masses. Msk:  Strength and tone appear normal for age. No joint deformities or effusions. Extremities: No clubbing or cyanosis. No edema.  Distal pedal pulses are 2+ bilaterally. Neuro: Alert and oriented X 3. Moves all extremities spontaneously. No focal deficits noted. Psych:  Responds to questions appropriately with a normal affect. Skin: No rashes or lesions noted  Wt Readings from Last 3 Encounters:  06/28/17 266 lb (120.7 kg)  04/28/17 278 lb (126.1 kg)  03/31/17 286 lb (129.7 kg)        Studies/Labs Reviewed:   EKG:  EKG is not ordered today.    Recent Labs: 03/22/2017: ALT 27; BUN 14; Creatinine, Ser 0.97; Hemoglobin 14.2; Platelets 483; Potassium 4.4; Sodium 140   Lipid Panel    Component Value Date/Time   CHOL 151 03/22/2017 1549   TRIG 111 03/22/2017 1549   HDL 47 03/22/2017 1549   CHOLHDL 3.2 03/22/2017 1549   LDLCALC 82 03/22/2017 1549    Additional studies/ records that  were reviewed today include:   Cardiac Catheterization: 03/16/2017  Class III angina pectoris correlating with a 95% proximal to mid LAD stenosis. The LAD also contains eccentric proximal less than 50% narrowing.  Normal left main, circumflex, and right coronary.  Normal left ventricular systolic function, with mild elevation in LVEDP of 21 mmHg consistent with diastolic heart failure.  Successful culprit PCI of the proximal to mid LAD stenosis reducing the 95% obstruction to 0% with TIMI grade 3 flow using an 18 x 3.5 mm Exposed Dilated to 3.75 MmHg High Pressure.   RECOMMENDATIONS:  Aggressive risk factor modification: High intensity statin therapy 2 LDL less than 70, hemoglobin A1c less than 6.5, weight reduction, blood pressure control 130/85 mmHg or less, continue ACE inhibitor therapy, consider adding low-dose beta blocker therapy,, and exercise.  Same day discharge of no complications.  No work for 7 days and appropriate instruction concerning resuming activity.  Consider phase II cardiac rehabilitation for educational purposes.   Assessment:    1. Coronary artery disease involving native coronary artery of native heart without angina pectoris   2. Essential hypertension, benign   3. Hyperlipidemia LDL goal <70   4. DM type 2 causing vascular disease (HCC)      Plan:   In order of problems listed above:  1. CAD - he is s/p PCI/DES to the mid-LAD in 03/2017. 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 No symptoms resembling when he required previous stent placement.  - will continue on DAPT with ASA and Brilinta until at least 03/2018. Continue statin therapy. Never initiated on BB therapy. BP remains well-controlled on current medication regimen and will continue with ACE-I at this time due to his Type 2 DM (Hgb A1c improving with weight loss).    2. HTN - BP is well controlled 118/76 during today's visit.    - Continue Lisinopril 10 mg daily.  3. HLD - Fasting lipid panel in 03/2017 showed total cholesterol 151, HDL 47, LDL 83 following initiation of Atorvastatin 80mg  daily. Will recheck FLP and LFT's as these have not been rechecked since.   4. Type 2 DM - Hgb A1c was significantly elevated to 10.8 in 01/2017. He has since lost 40 lbs with diet and says this is under better control. Not currently on medical therapy.   Medication Adjustments/Labs  and Tests Ordered: Current medicines are reviewed at length with the patient today.  Concerns regarding medicines are outlined above.  Medication changes, Labs and Tests ordered today are listed in the Patient Instructions below. Patient Instructions  Your physician wants you to follow-up in:  6 months with Dr.Nishan You will receive a reminder letter in the mail two months in advance. If you don't receive a letter, please call our office to schedule the follow-up appointment.   Your physician recommends that you continue on your current medications as directed. Please refer to the Current Medication list given to you today.    If you need a refill on your cardiac medications before your next appointment, please call your pharmacy.   Get FASTING lipid and LFT'S lab work, lab at [facility] opens at 7am  No tests ordered today.  Thank you for choosing [facility] !      Signed, Ellsworth Lennox, PA-C  06/28/2017 4:04 PM    [facility] [address] [place], [place] [address] Phone: [phone]

## 2017-06-28 ENCOUNTER — Ambulatory Visit: Payer: BLUE CROSS/BLUE SHIELD | Admitting: Student

## 2017-06-28 ENCOUNTER — Encounter: Payer: Self-pay | Admitting: Student

## 2017-06-28 VITALS — BP 118/76 | HR 83 | Ht 71.0 in | Wt 266.0 lb

## 2017-06-28 DIAGNOSIS — E785 Hyperlipidemia, unspecified: Secondary | ICD-10-CM

## 2017-06-28 DIAGNOSIS — I2510 Atherosclerotic heart disease of native coronary artery without angina pectoris: Secondary | ICD-10-CM

## 2017-06-28 DIAGNOSIS — I10 Essential (primary) hypertension: Secondary | ICD-10-CM

## 2017-06-28 DIAGNOSIS — E1159 Type 2 diabetes mellitus with other circulatory complications: Secondary | ICD-10-CM | POA: Diagnosis not present

## 2017-06-28 NOTE — Patient Instructions (Signed)
Your physician wants you to follow-up in:  6 months with Dr.Nishan You will receive a reminder letter in the mail two months in advance. If you don't receive a letter, please call our office to schedule the follow-up appointment.   Your physician recommends that you continue on your current medications as directed. Please refer to the Current Medication list given to you today.    If you need a refill on your cardiac medications before your next appointment, please call your pharmacy.    Get FASTING lipid and LFT'S lab work, lab at [facility] opens at 7am    No tests ordered today.      Thank you for choosing [facility] !

## 2017-06-29 ENCOUNTER — Other Ambulatory Visit (HOSPITAL_COMMUNITY)
Admission: RE | Admit: 2017-06-29 | Discharge: 2017-06-29 | Disposition: A | Discharge status: Home / Self Care | Payer: BLUE CROSS/BLUE SHIELD | Source: Ambulatory Visit | Attending: Student | Admitting: Student

## 2017-06-29 DIAGNOSIS — E7879 Other disorders of bile acid and cholesterol metabolism: Secondary | ICD-10-CM | POA: Insufficient documentation

## 2017-06-29 LAB — LIPID PANEL
Cholesterol: 120 mg/dL (ref 0–200)
HDL: 45 mg/dL (ref >40)
LDL CALC: 54 mg/dL (ref 0–99)
TRIGLYCERIDES: 103 mg/dL (ref <150)
Total CHOL/HDL Ratio: 2.7 RATIO
VLDL: 21 mg/dL (ref 0–40)

## 2017-06-29 LAB — HEPATIC FUNCTION PANEL
ALK PHOS: 60 U/L (ref 38–126)
ALT: 31 U/L (ref 17–63)
AST: 24 U/L (ref 15–41)
Albumin: 4.2 g/dL (ref 3.5–5.0)
TOTAL PROTEIN: 7.4 g/dL (ref 6.5–8.1)
Total Bilirubin: 0.9 mg/dL (ref 0.3–1.2)

## 2017-07-06 DIAGNOSIS — E1159 Type 2 diabetes mellitus with other circulatory complications: Secondary | ICD-10-CM | POA: Diagnosis not present

## 2017-07-07 LAB — COMPLETE METABOLIC PANEL WITH GFR
AG Ratio: 1.4 (calc) (ref 1.0–2.5)
ALKALINE PHOSPHATASE (APISO): 64 U/L (ref 40–115)
ALT: 24 U/L (ref 9–46)
AST: 20 U/L (ref 10–40)
Albumin: 4.6 g/dL (ref 3.6–5.1)
BUN: 14 mg/dL (ref 7–25)
CALCIUM: 9.9 mg/dL (ref 8.6–10.3)
CO2: 32 mmol/L (ref 20–32)
CREATININE: 1.02 mg/dL (ref 0.60–1.35)
Chloride: 105 mmol/L (ref 98–110)
GFR, EST NON AFRICAN AMERICAN: 86 mL/min/{1.73_m2} (ref >=60)
GFR, Est African American: 100 mL/min/{1.73_m2} (ref >=60)
GLUCOSE: 78 mg/dL (ref 65–99)
Globulin: 3.2 g/dL (calc) (ref 1.9–3.7)
Potassium: 4.1 mmol/L (ref 3.5–5.3)
Sodium: 140 mmol/L (ref 135–146)
Total Bilirubin: 0.8 mg/dL (ref 0.2–1.2)
Total Protein: 7.8 g/dL (ref 6.1–8.1)

## 2017-07-07 LAB — HEMOGLOBIN A1C
EAG (MMOL/L): 4.7 (calc)
HEMOGLOBIN A1C: 4.6 %{Hb} (ref <5.7)
Mean Plasma Glucose: 85 (calc)

## 2017-07-08 ENCOUNTER — Encounter: Payer: Self-pay | Admitting: "Endocrinology

## 2017-07-08 ENCOUNTER — Ambulatory Visit: Payer: BLUE CROSS/BLUE SHIELD | Admitting: "Endocrinology

## 2017-07-08 VITALS — BP 113/77 | HR 75 | Ht 71.0 in | Wt 260.0 lb

## 2017-07-08 DIAGNOSIS — Z6836 Body mass index (BMI) 36.0-36.9, adult: Secondary | ICD-10-CM

## 2017-07-08 DIAGNOSIS — E1159 Type 2 diabetes mellitus with other circulatory complications: Secondary | ICD-10-CM | POA: Diagnosis not present

## 2017-07-08 DIAGNOSIS — I10 Essential (primary) hypertension: Secondary | ICD-10-CM | POA: Diagnosis not present

## 2017-07-08 DIAGNOSIS — E1129 Type 2 diabetes mellitus with other diabetic kidney complication: Secondary | ICD-10-CM | POA: Diagnosis not present

## 2017-07-08 DIAGNOSIS — E782 Mixed hyperlipidemia: Secondary | ICD-10-CM | POA: Diagnosis not present

## 2017-07-08 DIAGNOSIS — E042 Nontoxic multinodular goiter: Secondary | ICD-10-CM

## 2017-07-08 HISTORY — DX: Nontoxic multinodular goiter: E04.2

## 2017-07-08 MED ORDER — SITAGLIP PHOS-METFORMIN HCL ER 50-1000 MG PO TB24: 1.0000 | ORAL_TABLET | Freq: Every day | ORAL | 6 refills | Status: DC

## 2017-07-08 NOTE — Progress Notes (Signed)
Subjective:    [name], male    DOB: Jun 11, 1965.  he is being seen in follow up  for management of currently uncontrolled symptomatic diabetes requested by  Joel Nevins, MD.   Past Medical History:  Diagnosis Date  . CAD (coronary artery disease), native coronary artery    10/18 PCI/DES mLAD, normal EF on LV gram  . Diabetes mellitus    Type 2 diagnosed 2 weeks ago  . Hiatal hernia with gastroesophageal reflux   . Hypertension   . Mixed hyperlipidemia   . Morbid obesity (HCC)   . OSA on CPAP   . Urinary frequency    Past Surgical History:  Procedure Laterality Date  . CIRCUMCISION     1988  . CORONARY STENT INTERVENTION N/A 03/16/2017   Procedure: CORONARY STENT INTERVENTION;  Surgeon: Lyn Records, MD;  Location: [facility] INVASIVE CV LAB;  Service: Cardiovascular;  Laterality: N/A;  . ESOPHAGEAL DILATION N/A 02/08/2015   Procedure: ESOPHAGEAL DILATION;  Surgeon: Malissa Hippo, MD;  Location: [facility] ENDO SUITE;  Service: Endoscopy;  Laterality: N/A;  . ESOPHAGOGASTRODUODENOSCOPY N/A 02/08/2015   Procedure: ESOPHAGOGASTRODUODENOSCOPY (EGD);  Surgeon: Malissa Hippo, MD;  Location: [facility] ENDO SUITE;  Service: Endoscopy;  Laterality: N/A;  1055  . ESOPHAGOGASTRODUODENOSCOPY (EGD) WITH ESOPHAGEAL DILATION  03/29/2012   Procedure: ESOPHAGOGASTRODUODENOSCOPY (EGD) WITH ESOPHAGEAL DILATION;  Surgeon: Malissa Hippo, MD;  Location: [facility] ENDO SUITE;  Service: Endoscopy;  Laterality: N/A;  730  . LEFT HEART CATH AND CORONARY ANGIOGRAPHY N/A 03/16/2017   Procedure: LEFT HEART CATH AND CORONARY ANGIOGRAPHY;  Surgeon: Lyn Records, MD;  Location: [facility] INVASIVE CV LAB;  Service: Cardiovascular;  Laterality: N/A;  . ULTRASOUND GUIDANCE FOR VASCULAR ACCESS  03/16/2017   Procedure: Ultrasound Guidance For Vascular Access;  Surgeon: Lyn Records, MD;  Location: [facility] INVASIVE CV LAB;  Service: Cardiovascular;;   Social History   Socioeconomic History  . Marital status: Married     Spouse name: None  . Number of children: None  . Years of education: None  . Highest education level: None  Social Needs  . Financial resource strain: None  . Food insecurity - worry: None  . Food insecurity - inability: None  . Transportation needs - medical: None  . Transportation needs - non-medical: None  Occupational History  . None  Tobacco Use  . Smoking status: Never Smoker  . Smokeless tobacco: Never Used  Substance and Sexual Activity  . Alcohol use: No  . Drug use: No  . Sexual activity: None  Other Topics Concern  . None  Social History Narrative   Drinks caffeine 4 times a week.      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   Outpatient Encounter Medications as of 07/08/2017  Medication Sig  . aspirin 81 MG chewable tablet Chew 1 tablet (81 mg total) by mouth daily.  [name] atorvastatin (LIPITOR) 80 MG tablet Take 1 tablet (80 mg total) by mouth daily at 6 PM.  . esomeprazole (NEXIUM) 20 MG capsule Take 20 mg by mouth daily as needed (acid reflux).   . Homeopathic Products (SIMILASAN PINK EYE RELIEF) SOLN Apply 1 drop to eye daily as needed (redness).  [name] ketotifen (ZADITOR) 0.025 % ophthalmic solution Place 2 drops into both eyes daily as needed (allergies).  [name] lisinopril (PRINIVIL,ZESTRIL) 10 MG tablet Take 1 tablet (10 mg total) by mouth daily.  . Melatonin 2.5 MG CAPS Take 2.5 mg by mouth at bedtime as needed (sleep).  [name]  nitroGLYCERIN (NITROSTAT) 0.4 MG SL tablet Place 1 tablet (0.4 mg total) under the tongue every 5 (five) minutes as needed.  . sildenafil (VIAGRA) 100 MG tablet Take 100 mg by mouth as needed.  . SitaGLIPtin-MetFORMIN HCl (JANUMET XR) 50-1000 MG TB24 Take 1 tablet by mouth daily after breakfast.  . ticagrelor (BRILINTA) 90 MG TABS tablet Take 1 tablet (90 mg total) by mouth 2 (two) times daily.   No facility-administered encounter medications on file as of 07/08/2017.     ALLERGIES: Allergies  Allergen Reactions  . Shellfish Allergy Swelling    Turns red  .  Strawberry Extract Rash    VACCINATION STATUS:  There is no immunization history on file for this patient.  Diabetes  He presents for his follow-up diabetic visit. He has type 2 diabetes mellitus. Onset time: He reports that he was diagnosed at approximate age of 52 years. His disease course has been improving. There are no hypoglycemic associated symptoms. Pertinent negatives for hypoglycemia include no confusion, headaches, pallor, seizures or tremors. Pertinent negatives for diabetes include no blurred vision, no chest pain, no fatigue, no polydipsia, no polyphagia, no polyuria and no weakness. There are no hypoglycemic complications. Symptoms are improving. Diabetic complications include heart disease. Risk factors for coronary artery disease include dyslipidemia, diabetes mellitus, family history, hypertension, male sex, obesity and sedentary lifestyle. Current diabetic treatment includes oral agent (dual therapy) (She is taking Janumet 50/1000 mg once a day, metformin 1000 mg once a day.). His weight is decreasing steadily (He lost 33 pounds since October 2018.). He is following a diabetic diet. When asked about meal planning, he reported none. He has not had a previous visit with a dietitian. He participates in exercise intermittently. An ACE inhibitor/angiotensin II receptor blocker is being taken. He does not see a podiatrist.Eye exam is not current.  Hyperlipidemia  This is a chronic problem. The current episode started more than 1 year ago. The problem is controlled. Exacerbating diseases include diabetes and obesity. Pertinent negatives include no chest pain, myalgias or shortness of breath. Current antihyperlipidemic treatment includes statins. Risk factors for coronary artery disease include dyslipidemia, diabetes mellitus, hypertension, male sex, obesity, a sedentary lifestyle and family history.  Hypertension  This is a chronic problem. The problem is controlled. Pertinent negatives  include no blurred vision, chest pain, headaches, neck pain, palpitations or shortness of breath. Risk factors for coronary artery disease include diabetes mellitus, dyslipidemia, obesity, male gender, sedentary lifestyle and family history. Past treatments include ACE inhibitors. Hypertensive end-organ damage includes CAD/MI.     Review of Systems  Constitutional: Negative for chills, fatigue, fever and unexpected weight change.  HENT: Negative for dental problem, mouth sores and trouble swallowing.   Eyes: Negative for blurred vision and visual disturbance.  Respiratory: Negative for cough, choking, chest tightness, shortness of breath and wheezing.   Cardiovascular: Negative for chest pain, palpitations and leg swelling.       No Shortness of breath  Gastrointestinal: Negative for abdominal distention, abdominal pain, constipation, diarrhea, nausea and vomiting.  Endocrine: Negative for polydipsia, polyphagia and polyuria.  Genitourinary: Negative for dysuria, flank pain, frequency, hematuria and urgency.  Musculoskeletal: Negative for back pain, gait problem, myalgias and neck pain.  Skin: Negative for pallor, rash and wound.  Neurological: Negative for tremors, seizures, syncope, weakness, numbness and headaches.  Hematological: Does not bruise/bleed easily.  Psychiatric/Behavioral: Negative for confusion, dysphoric mood, hallucinations and suicidal ideas.    Objective:    BP 113/77   Pulse  75   Ht 5\' 11"  (1.803 m)   Wt 260 lb (117.9 kg)   BMI 36.26 kg/m   Wt Readings from Last 3 Encounters:  07/08/17 260 lb (117.9 kg)  06/28/17 266 lb (120.7 kg)  04/28/17 278 lb (126.1 kg)     Physical Exam  Constitutional: He is oriented to person, place, and time. He appears well-developed. He is cooperative. No distress.  HENT:  Head: Normocephalic and atraumatic.  Eyes: EOM are normal.  Neck: Normal range of motion. Neck supple. No tracheal deviation present. No thyromegaly present.   Cardiovascular: Normal rate, S1 normal, S2 normal and normal heart sounds. Exam reveals no gallop.  No murmur heard. Pulses:      Dorsalis pedis pulses are 1+ on the right side, and 1+ on the left side.       Posterior tibial pulses are 1+ on the right side, and 1+ on the left side.  Pulmonary/Chest: Breath sounds normal. No respiratory distress. He has no wheezes.  Abdominal: Soft. Bowel sounds are normal. He exhibits no distension. There is no tenderness. There is no guarding and no CVA tenderness.  Musculoskeletal: He exhibits no edema.       Right shoulder: He exhibits no swelling and no deformity.  Neurological: He is alert and oriented to person, place, and time. He has normal strength and normal reflexes. No cranial nerve deficit or sensory deficit. Gait normal.  Skin: Skin is warm and dry. No rash noted. No cyanosis. Nails show no clubbing.  Psychiatric: His speech is normal. Thought content normal. Cognition and memory are normal.    CMP ( most recent) CMP     Component Value Date/Time   NA 140 07/06/2017 0845   NA 140 03/22/2017 1549   K 4.1 07/06/2017 0845   CL 105 07/06/2017 0845   CO2 32 07/06/2017 0845   GLUCOSE 78 07/06/2017 0845   BUN 14 07/06/2017 0845   BUN 14 03/22/2017 1549   CREATININE 1.02 07/06/2017 0845   CALCIUM 9.9 07/06/2017 0845   PROT 7.8 07/06/2017 0845   PROT 8.3 03/22/2017 1549   ALBUMIN 4.2 06/29/2017 0807   ALBUMIN 4.6 03/22/2017 1549   AST 20 07/06/2017 0845   ALT 24 07/06/2017 0845   ALKPHOS 60 06/29/2017 0807   BILITOT 0.8 07/06/2017 0845   BILITOT 0.4 03/22/2017 1549   GFRNONAA 86 07/06/2017 0845   GFRAA 100 07/06/2017 0845     Diabetic Labs (most recent): Lab Results  Component Value Date   HGBA1C 4.6 07/06/2017   HGBA1C 10.8 02/23/2017     Lipid Panel ( most recent) Lipid Panel     Component Value Date/Time   CHOL 120 06/29/2017 0807   CHOL 151 03/22/2017 1549   TRIG 103 06/29/2017 0807   HDL 45 06/29/2017 0807   HDL  47 03/22/2017 1549   CHOLHDL 2.7 06/29/2017 0807   VLDL 21 06/29/2017 0807   LDLCALC 54 06/29/2017 0807   LDLCALC 82 03/22/2017 1549     Assessment & Plan:   1. DM type 2 causing vascular disease (HCC)  - Patient has currently uncontrolled symptomatic type 2 DM since  52 years of age.   He came with impressive improvement in his glycemic profile, A1c down to 4.6% from 10.8% complete with 33 pounds of weight loss since October 2018.     Recent labs reviewed.  -his diabetes is complicated by coronary artery disease with stent placement on 03/15/2017 and DJUAN TALTON remains at a  high risk for more acute and chronic complications which include CAD, CVA, CKD, retinopathy, and neuropathy. These are all discussed in detail with the patient.  - I have counseled him on diet management and weight loss, by adopting a carbohydrate restricted/protein rich diet.  -  Suggestion is made for him to avoid simple carbohydrates  from his diet including Cakes, Sweet Desserts / Pastries, Ice Cream, Soda (diet and regular), Sweet Tea, Candies, Chips, Cookies, Store Bought Juices, Alcohol in Excess of  1-2 drinks a day, Artificial Sweeteners, and "Sugar-free" Products. This will help patient to have stable blood glucose profile and potentially avoid unintended weight gain.  - I encouraged him to switch to  unprocessed or minimally processed complex starch and increased protein intake (animal or plant source), fruits, and vegetables.  - he is advised to stick to a routine mealtimes to eat 3 meals  a day and avoid unnecessary snacks ( to snack only to correct hypoglycemia).   - he will be scheduled with Norm SaltPenny Crumpton, RDN, CDE for individualized DM education- consult pending.  - I have approached him with the following individualized plan to manage diabetes and patient agrees:   -He came with an impressive results with A1c of 4.6%, improving from 10.8%.  - I will continue  Janumet 50/1000 mg ER once  a day after breakfast.   - Patient specific target  A1c;  LDL, HDL, Triglycerides, and  Waist Circumference were discussed in detail.  2) BP/HTN: His blood pressure is controlled to target. I advised him to continue his current medications including ACEI/ARB. 3) Lipids/HPL:   Controlled LDL 82.   Patient is advised to continue statins. 4)  Weight/Diet: CDE Consult has been initiated , exercise, and detailed carbohydrates information provided.  5) Chronic Care/Health Maintenance:  -he  is on ACEI/ARB and Statin medications and  is encouraged to continue to follow up with Ophthalmology, Dentist,  Podiatrist at least yearly or according to recommendations, and advised to  stay away from smoking. I have recommended yearly flu vaccine and pneumonia vaccination at least every 5 years; moderate intensity exercise for up to 150 minutes weekly; and  sleep for at least 7 hours a day.  - Time spent with the patient: 25 min, of which >50% was spent in reviewing his blood glucose logs , discussing his hypo- and hyper-glycemic episodes, reviewing his current and  previous labs and insulin doses and developing a plan to avoid hypo- and hyper-glycemia. Please refer to Patient Instructions for Blood Glucose Monitoring and Insulin/Medications Dosing Guide"  in media tab for additional information.   - I advised patient to maintain close follow up with Joel Ward, [place], MD for primary care needs.  Follow up plan: - Return in about 6 months (around 01/05/2018) for follow up with pre-visit labs.  Marquis LunchGebre Marilla Boddy, MD Phone: [phone]  Fax: [phone]   07/08/2017, 9:30 AM This note was partially dictated with voice recognition software. Similar sounding words can be transcribed inadequately or may not  be corrected upon review.

## 2017-07-08 NOTE — Patient Instructions (Signed)

## 2017-08-05 ENCOUNTER — Encounter: Payer: BLUE CROSS/BLUE SHIELD | Attending: Internal Medicine | Admitting: Nutrition

## 2017-08-05 ENCOUNTER — Encounter: Payer: Self-pay | Admitting: Nutrition

## 2017-08-05 VITALS — Ht 71.0 in | Wt 267.0 lb

## 2017-08-05 DIAGNOSIS — E119 Type 2 diabetes mellitus without complications: Secondary | ICD-10-CM

## 2017-08-05 DIAGNOSIS — E1159 Type 2 diabetes mellitus with other circulatory complications: Secondary | ICD-10-CM | POA: Diagnosis not present

## 2017-08-05 DIAGNOSIS — Z713 Dietary counseling and surveillance: Secondary | ICD-10-CM | POA: Diagnosis not present

## 2017-08-05 DIAGNOSIS — E669 Obesity, unspecified: Secondary | ICD-10-CM

## 2017-08-05 NOTE — Patient Instructions (Signed)
Goals 1. Follow MY Plate Method 2. Eat 45-60 grams of carbs per meal 3. 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 Keep drinking gallon of water a day 5. Keep working out. Lose 1-2 lbs per week Keep A1C 5.7% or below.

## 2017-08-05 NOTE — Progress Notes (Signed)
  Medical Nutrition Therapy:  Appt start time: 0930 end time:  1030.   Assessment:  Primary concerns today:  Follow up Weight and DM Type 2. He saw dietitian in [place] on his initial visit.. Sees Dr. Fransico HimNIda for Endocrinology. LIves with his wife. He does most cooking and shopping. Eats 3 meals and 2 snacks a day. Janument 50/500 mg a day. Works at [employer] 7 pm to 7 am. 12 hr shifts. Lost 30+ in 3-4 months. BMI down from 29 to 37.  A1C dropped from 11% 3-4 month ago.down to 4.6%.  Changes made with eating habits:  cut out sweet desserts and junk food and snacks. Says he is hungry often and tired a lot.  Current diet is too restrictive of carbs at meal times which may contribute to lower blood sugars and fatigue.   Lab Results  Component Value Date   HGBA1C 4.6 07/06/2017   Preferred Learning Style:   No preference indicated   Learning Readiness:   Change in progress   MEDICATIONS: See list   DIETARY INTAKE:   24-hr recall:  B ( AM): ! Cup yogurt or [place]key sausage and cheese,  water Snk ( AM):  Peanuts or boiled eggs or popcorn,  L ( PM):  Toss salad ham or [place]key chef salad with Ranch and basamilic, water Snk ( PM): popcorn or yogurt or nuts D ( PM):  Stir fry vegetables, water Snk ( PM):  Beverages: water  Usual physical activity:  Walks or goes to [employer].  Estimated energy needs: 2000-2200  calories 225-248 g carbohydrates 150- g protein 56 g fat  Progress Towards Goal(s):  In progress.   Nutritional Diagnosis:  NB-1.1 Food and nutrition-related knowledge deficit As related to Diabetes Type 2 and overweight .  As evidenced by A1C was 9% and BMI was 39.    Intervention:  Nutrition and Diabetes education provided on My Plate, CHO counting, meal planning, portion sizes, timing of meals, avoiding snacks between meals unless having a low blood sugar, target ranges for A1C and blood sugars, signs/symptoms and treatment of hyper/hypoglycemia, monitoring blood sugars, taking  medications as prescribed, benefits of exercising 30 minutes per day and prevention of complications of DM.  Goals 1. Follow MY Plate Method 2. Eat 45-60 grams of carbs per meal 3. 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 Keep drinking gallon of water a day 5. Keep working out. Lose 1-2 lbs per week Keep A1C 5.7% or below.   Teaching Method Utilized:  Visual Auditory Hands on  Handouts given during visit include:  The Plate Method   Meal Plan Card     Barriers to learning/adherence to lifestyle change:  none  Demonstrated degree of understanding via:  Teach Back   Monitoring/Evaluation:  Dietary intake, exercise,  Meal planning, and body weight in 6 month(s).

## 2017-08-19 DIAGNOSIS — H524 Presbyopia: Secondary | ICD-10-CM | POA: Diagnosis not present

## 2017-08-19 DIAGNOSIS — Z7984 Long term (current) use of oral hypoglycemic drugs: Secondary | ICD-10-CM | POA: Diagnosis not present

## 2017-08-19 DIAGNOSIS — E119 Type 2 diabetes mellitus without complications: Secondary | ICD-10-CM | POA: Diagnosis not present

## 2017-09-04 ENCOUNTER — Other Ambulatory Visit: Payer: Self-pay | Admitting: Cardiology

## 2017-11-12 ENCOUNTER — Ambulatory Visit (HOSPITAL_COMMUNITY)
Admission: RE | Admit: 2017-11-12 | Discharge: 2017-11-12 | Disposition: A | Discharge status: Home / Self Care | Payer: BLUE CROSS/BLUE SHIELD | Source: Ambulatory Visit | Attending: "Endocrinology | Admitting: "Endocrinology

## 2017-11-12 DIAGNOSIS — E049 Nontoxic goiter, unspecified: Secondary | ICD-10-CM | POA: Diagnosis not present

## 2017-11-12 DIAGNOSIS — E042 Nontoxic multinodular goiter: Secondary | ICD-10-CM

## 2017-11-12 DIAGNOSIS — E010 Iodine-deficiency related diffuse (endemic) goiter: Secondary | ICD-10-CM | POA: Insufficient documentation

## 2017-12-06 ENCOUNTER — Ambulatory Visit: Payer: BLUE CROSS/BLUE SHIELD | Admitting: Nutrition

## 2017-12-06 ENCOUNTER — Ambulatory Visit: Payer: BLUE CROSS/BLUE SHIELD | Admitting: "Endocrinology

## 2017-12-20 DIAGNOSIS — E1159 Type 2 diabetes mellitus with other circulatory complications: Secondary | ICD-10-CM | POA: Diagnosis not present

## 2017-12-21 LAB — HEMOGLOBIN A1C
Hgb A1c MFr Bld: 6.8 % of total Hgb — ABNORMAL HIGH (ref <5.7)
MEAN PLASMA GLUCOSE: 148 (calc)
eAG (mmol/L): 8.2 (calc)

## 2017-12-21 LAB — COMPLETE METABOLIC PANEL WITH GFR
AG Ratio: 1.3 (calc) (ref 1.0–2.5)
ALBUMIN MSPROF: 4.3 g/dL (ref 3.6–5.1)
ALKALINE PHOSPHATASE (APISO): 77 U/L (ref 40–115)
ALT: 28 U/L (ref 9–46)
AST: 18 U/L (ref 10–40)
BILIRUBIN TOTAL: 0.5 mg/dL (ref 0.2–1.2)
BUN: 13 mg/dL (ref 7–25)
CHLORIDE: 101 mmol/L (ref 98–110)
CO2: 29 mmol/L (ref 20–32)
Calcium: 9.6 mg/dL (ref 8.6–10.3)
Creat: 1.04 mg/dL (ref 0.60–1.35)
GFR, Est African American: 97 mL/min/{1.73_m2} (ref >=60)
GFR, Est Non African American: 84 mL/min/{1.73_m2} (ref >=60)
GLUCOSE: 181 mg/dL — AB (ref 65–139)
Globulin: 3.2 g/dL (calc) (ref 1.9–3.7)
Potassium: 4 mmol/L (ref 3.5–5.3)
SODIUM: 137 mmol/L (ref 135–146)
Total Protein: 7.5 g/dL (ref 6.1–8.1)

## 2017-12-24 ENCOUNTER — Ambulatory Visit (INDEPENDENT_AMBULATORY_CARE_PROVIDER_SITE_OTHER): Payer: BLUE CROSS/BLUE SHIELD | Admitting: "Endocrinology

## 2017-12-24 ENCOUNTER — Encounter: Payer: Self-pay | Admitting: "Endocrinology

## 2017-12-24 VITALS — BP 137/89 | HR 80 | Ht 71.0 in | Wt 291.0 lb

## 2017-12-24 DIAGNOSIS — E042 Nontoxic multinodular goiter: Secondary | ICD-10-CM

## 2017-12-24 DIAGNOSIS — E1159 Type 2 diabetes mellitus with other circulatory complications: Secondary | ICD-10-CM | POA: Diagnosis not present

## 2017-12-24 DIAGNOSIS — I10 Essential (primary) hypertension: Secondary | ICD-10-CM | POA: Diagnosis not present

## 2017-12-24 DIAGNOSIS — E782 Mixed hyperlipidemia: Secondary | ICD-10-CM

## 2017-12-24 MED ORDER — ATORVASTATIN CALCIUM 40 MG PO TABS: ORAL_TABLET | ORAL | 6 refills | Status: DC

## 2017-12-24 NOTE — Patient Instructions (Signed)

## 2017-12-24 NOTE — Progress Notes (Signed)
Subjective:    [name], male    DOB: [birth year].  he is being seen in follow up  for management of currently uncontrolled symptomatic diabetes requested by  Joel Nevins, Joel Ward.   Past Medical History:  Diagnosis Date  . CAD (coronary artery disease), native coronary artery    10/18 PCI/DES mLAD, normal EF on LV gram  . Diabetes mellitus    Type 2 diagnosed 2 weeks ago  . Hiatal hernia with gastroesophageal reflux   . Hypertension   . Mixed hyperlipidemia   . Morbid obesity (HCC)   . OSA on CPAP   . Urinary frequency    Past Surgical History:  Procedure Laterality Date  . CIRCUMCISION     1988  . CORONARY STENT INTERVENTION N/A 03/16/2017   Procedure: CORONARY STENT INTERVENTION;  Surgeon: Lyn Records, Joel Ward;  Location: [facility] INVASIVE CV LAB;  Service: Cardiovascular;  Laterality: N/A;  . ESOPHAGEAL DILATION N/A 02/08/2015   Procedure: ESOPHAGEAL DILATION;  Surgeon: Malissa Hippo, Joel Ward;  Location: [facility] ENDO SUITE;  Service: Endoscopy;  Laterality: N/A;  . ESOPHAGOGASTRODUODENOSCOPY N/A 02/08/2015   Procedure: ESOPHAGOGASTRODUODENOSCOPY (EGD);  Surgeon: Malissa Hippo, Joel Ward;  Location: [facility] ENDO SUITE;  Service: Endoscopy;  Laterality: N/A;  1055  . ESOPHAGOGASTRODUODENOSCOPY (EGD) WITH ESOPHAGEAL DILATION  03/29/2012   Procedure: ESOPHAGOGASTRODUODENOSCOPY (EGD) WITH ESOPHAGEAL DILATION;  Surgeon: Malissa Hippo, Joel Ward;  Location: [facility] ENDO SUITE;  Service: Endoscopy;  Laterality: N/A;  730  . LEFT HEART CATH AND CORONARY ANGIOGRAPHY N/A 03/16/2017   Procedure: LEFT HEART CATH AND CORONARY ANGIOGRAPHY;  Surgeon: Lyn Records, Joel Ward;  Location: [facility] INVASIVE CV LAB;  Service: Cardiovascular;  Laterality: N/A;  . ULTRASOUND GUIDANCE FOR VASCULAR ACCESS  03/16/2017   Procedure: Ultrasound Guidance For Vascular Access;  Surgeon: Lyn Records, Joel Ward;  Location: [facility] INVASIVE CV LAB;  Service: Cardiovascular;;   Social History   Socioeconomic History  . Marital status: Married     Spouse name: Not on file  . Number of children: Not on file  . Years of education: Not on file  . Highest education level: Not on file  Occupational History  . Not on file  Social Needs  . Financial resource strain: Not on file  . Food insecurity:    Worry: Not on file    Inability: Not on file  . Transportation needs:    Medical: Not on file    Non-medical: Not on file  Tobacco Use  . Smoking status: Never Smoker  . Smokeless tobacco: Never Used  Substance and Sexual Activity  . Alcohol use: No  . Drug use: No  . Sexual activity: Not on file  Lifestyle  . Physical activity:    Days per week: Not on file    Minutes per session: Not on file  . Stress: Not on file  Relationships  . Social connections:    Talks on phone: Not on file    Gets together: Not on file    Attends religious service: Not on file    Active member of club or organization: Not on file    Attends meetings of clubs or organizations: Not on file    Relationship status: Not on file  Other Topics Concern  . Not on file  Social History Narrative   Drinks caffeine 4 times a week.      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   Outpatient Encounter Medications as of 12/24/2017  Medication Sig  . aspirin 81 MG  chewable tablet Chew 1 tablet (81 mg total) by mouth daily.  [name] atorvastatin (LIPITOR) 80 MG tablet TAKE 1 TABLET BY MOUTH ONCE DAILY AT  6  PM  . cyclobenzaprine (FLEXERIL) 10 MG tablet Take 10 mg by mouth 3 (three) times daily as needed. for muscle spams  . esomeprazole (NEXIUM) 20 MG capsule Take 20 mg by mouth daily as needed (acid reflux).   . Homeopathic Products (SIMILASAN PINK EYE RELIEF) SOLN Apply 1 drop to eye daily as needed (redness).  [name] ketotifen (ZADITOR) 0.025 % ophthalmic solution Place 2 drops into both eyes daily as needed (allergies).  [name] lisinopril (PRINIVIL,ZESTRIL) 10 MG tablet Take 1 tablet (10 mg total) by mouth daily.  . Melatonin 2.5 MG CAPS Take 2.5 mg by mouth at bedtime as needed (sleep).   . nitroGLYCERIN (NITROSTAT) 0.4 MG SL tablet Place 1 tablet (0.4 mg total) under the tongue every 5 (five) minutes as needed. (Patient not taking: Reported on 08/05/2017)  . sildenafil (VIAGRA) 100 MG tablet Take 100 mg by mouth as needed.  . SitaGLIPtin-MetFORMIN HCl (JANUMET XR) 50-1000 MG TB24 Take 1 tablet by mouth daily after breakfast.  . ticagrelor (BRILINTA) 90 MG TABS tablet Take 1 tablet (90 mg total) by mouth 2 (two) times daily.   No facility-administered encounter medications on file as of 12/24/2017.     ALLERGIES: Allergies  Allergen Reactions  . Shellfish Allergy Swelling    Turns red  . Strawberry Extract Rash    VACCINATION STATUS:  There is no immunization history on file for this patient.  Diabetes  He presents for his follow-up diabetic visit. He has type 2 diabetes mellitus. Onset time: He reports that he was diagnosed at approximate age of 52 years. His disease course has been worsening. There are no hypoglycemic associated symptoms. Pertinent negatives for hypoglycemia include no confusion, headaches, pallor, seizures or tremors. Pertinent negatives for diabetes include no blurred vision, no chest pain, no fatigue, no polydipsia, no polyphagia, no polyuria and no weakness. There are no hypoglycemic complications. Symptoms are worsening. Diabetic complications include heart disease. Risk factors for coronary artery disease include dyslipidemia, diabetes mellitus, family history, hypertension, male sex, obesity and sedentary lifestyle. Current diabetic treatment includes oral agent (dual therapy) (She is taking Janumet 50/1000 mg once a day, metformin 1000 mg once a day.). His weight is increasing steadily. He is following a diabetic diet. When asked about meal planning, he reported none. He has not had a previous visit with a dietitian. He participates in exercise intermittently. An ACE inhibitor/angiotensin II receptor blocker is being taken. He does not see a  podiatrist.Eye exam is not current.  Hyperlipidemia  This is a chronic problem. The current episode started more than 1 year ago. The problem is controlled. Exacerbating diseases include diabetes and obesity. Pertinent negatives include no chest pain, myalgias or shortness of breath. Current antihyperlipidemic treatment includes statins. Risk factors for coronary artery disease include dyslipidemia, diabetes mellitus, hypertension, male sex, obesity, a sedentary lifestyle and family history.  Hypertension  This is a chronic problem. The problem is controlled. Pertinent negatives include no blurred vision, chest pain, headaches, neck pain, palpitations or shortness of breath. Risk factors for coronary artery disease include diabetes mellitus, dyslipidemia, obesity, male gender, sedentary lifestyle and family history. Past treatments include ACE inhibitors. Hypertensive end-organ damage includes CAD/MI.     Review of Systems  Constitutional: Negative for chills, fatigue, fever and unexpected weight change.  HENT: Negative for dental problem, mouth sores and  trouble swallowing.   Eyes: Negative for blurred vision and visual disturbance.  Respiratory: Negative for cough, choking, chest tightness, shortness of breath and wheezing.   Cardiovascular: Negative for chest pain, palpitations and leg swelling.       No Shortness of breath  Gastrointestinal: Negative for abdominal distention, abdominal pain, constipation, diarrhea, nausea and vomiting.  Endocrine: Negative for polydipsia, polyphagia and polyuria.  Genitourinary: Negative for dysuria, flank pain, frequency, hematuria and urgency.  Musculoskeletal: Negative for back pain, gait problem, myalgias and neck pain.  Skin: Negative for pallor, rash and wound.  Neurological: Negative for tremors, seizures, syncope, weakness, numbness and headaches.  Hematological: Does not bruise/bleed easily.  Psychiatric/Behavioral: Negative for confusion,  dysphoric mood, hallucinations and suicidal ideas.    Objective:    BP 137/89   Pulse 80   Ht 5\' 11"  (1.803 m)   Wt 291 lb (132 kg)   BMI 40.59 kg/m   Wt Readings from Last 3 Encounters:  12/24/17 291 lb (132 kg)  08/05/17 267 lb (121.1 kg)  07/08/17 260 lb (117.9 kg)     Physical Exam  Constitutional: He is oriented to person, place, and time. He appears well-developed. He is cooperative. No distress.  HENT:  Head: Normocephalic and atraumatic.  Eyes: EOM are normal.  Neck: Normal range of motion. Neck supple. No tracheal deviation present. No thyromegaly present.  Cardiovascular: Normal rate, S1 normal, S2 normal and normal heart sounds. Exam reveals no gallop.  No murmur heard. Pulses:      Dorsalis pedis pulses are 1+ on the right side, and 1+ on the left side.       Posterior tibial pulses are 1+ on the right side, and 1+ on the left side.  Pulmonary/Chest: Effort normal. No respiratory distress. He has no wheezes.  Abdominal: He exhibits no distension. There is no tenderness. There is no guarding and no CVA tenderness.  Musculoskeletal: He exhibits no edema.       Right shoulder: He exhibits no swelling and no deformity.  Neurological: He is alert and oriented to person, place, and time. He has normal strength. No cranial nerve deficit or sensory deficit. Gait normal.  Skin: Skin is warm and dry. No rash noted. No cyanosis. Nails show no clubbing.  Psychiatric: He has a normal mood and affect. His speech is normal. Thought content normal. Cognition and memory are normal.    CMP ( most recent) CMP     Component Value Date/Time   NA 137 12/20/2017 0739   NA 140 03/22/2017 1549   K 4.0 12/20/2017 0739   CL 101 12/20/2017 0739   CO2 29 12/20/2017 0739   GLUCOSE 181 (H) 12/20/2017 0739   BUN 13 12/20/2017 0739   BUN 14 03/22/2017 1549   CREATININE 1.04 12/20/2017 0739   CALCIUM 9.6 12/20/2017 0739   PROT 7.5 12/20/2017 0739   PROT 8.3 03/22/2017 1549   ALBUMIN  4.2 06/29/2017 0807   ALBUMIN 4.6 03/22/2017 1549   AST 18 12/20/2017 0739   ALT 28 12/20/2017 0739   ALKPHOS 60 06/29/2017 0807   BILITOT 0.5 12/20/2017 0739   BILITOT 0.4 03/22/2017 1549   GFRNONAA 84 12/20/2017 0739   GFRAA 97 12/20/2017 0739     Diabetic Labs (most recent): Lab Results  Component Value Date   HGBA1C 6.8 (H) 12/20/2017   HGBA1C 4.6 07/06/2017   HGBA1C 10.8 02/23/2017     Lipid Panel ( most recent) Lipid Panel     Component Value  Date/Time   CHOL 120 06/29/2017 0807   CHOL 151 03/22/2017 1549   TRIG 103 06/29/2017 0807   HDL 45 06/29/2017 0807   HDL 47 03/22/2017 1549   CHOLHDL 2.7 06/29/2017 0807   VLDL 21 06/29/2017 0807   LDLCALC 54 06/29/2017 0807   LDLCALC 82 03/22/2017 1549     Assessment & Plan:   1. DM type 2 causing vascular disease (HCC) - Patient has currently uncontrolled symptomatic type 2 DM since  52 years of age.  He came with loss of control of diabetes with A1c of 6.8% from 4.6%.  He has regained 24 pounds after he lost 30 pounds prior to his last visit.    Recent labs reviewed.  -his diabetes is complicated by coronary artery disease with stent placement on 03/15/2017 and Joel Ward remains at a high risk for more acute and chronic complications which include CAD, CVA, CKD, retinopathy, and neuropathy. These are all discussed in detail with the patient.  - I have counseled him on diet management and weight loss, by adopting a carbohydrate restricted/protein rich diet. -He admits to dietary indiscretions. -  Suggestion is made for him to avoid simple carbohydrates  from his diet including Cakes, Sweet Desserts / Pastries, Ice Cream, Soda (diet and regular), Sweet Tea, Candies, Chips, Cookies, Store Bought Juices, Alcohol in Excess of  1-2 drinks a day, Artificial Sweeteners, and "Sugar-free" Products. This will help patient to have stable blood glucose profile and potentially avoid unintended weight gain.   - I encouraged  him to switch to  unprocessed or minimally processed complex starch and increased protein intake (animal or plant source), fruits, and vegetables.  - he is advised to stick to a routine mealtimes to eat 3 meals  a day and avoid unnecessary snacks ( to snack only to correct hypoglycemia).   - he will be scheduled with Norm Salt, RDN, CDE for individualized DM education- consult pending.  - I have approached him with the following individualized plan to manage diabetes and patient agrees:   -If he continues to lose control he may require additional treatment in his next visit.  However, for now I will continue only on Janumet 50/1000 mg ER once a day after breakfast. -He came with an impressive results with A1c of 4.6%, improving from 10.8%. - Patient specific target  A1c;  LDL, HDL, Triglycerides, and  Waist Circumference were discussed in detail.  2) BP/HTN: His blood pressure is controlled to target.  He will continue his blood pressure medications including lisinopril 10 mg p.o. daily.   3) Lipids/HPL: His recent lipid panel showed controlled LDL at 54.  He is advised to continue atorvastatin, at a lower dose of 40 mg p.o. nightly.    4)  Weight/Diet: CDE Consult has been initiated , exercise, and detailed carbohydrates information provided.  5) Chronic Care/Health Maintenance:  -he  is on ACEI/ARB and Statin medications and  is encouraged to continue to follow up with Ophthalmology, Dentist,  Podiatrist at least yearly or according to recommendations, and advised to  stay away from smoking. I have recommended yearly flu vaccine and pneumonia vaccination at least every 5 years; moderate intensity exercise for up to 150 minutes weekly; and  sleep for at least 7 hours a day.  - Time spent with the patient: 25 min, of which >50% was spent in reviewing his  current and  previous labs, previous treatments, and medications doses and developing a plan for long-term care.  Joel Ward  participated in the discussions, expressed understanding, and voiced agreement with the above plans.  All questions were answered to his satisfaction. he is encouraged to contact clinic should he have any questions or concerns prior to his return visit.  - I advised patient to maintain close follow up with Joel Ward, Lawrence, Joel Ward for primary care needs.  Follow up plan: - Return in about 3 months (around 03/26/2018) for follow up with pre-visit labs, meter, and logs.  Marquis LunchGebre Alaysia Lightle, Joel Ward Phone: [phone]  Fax: [phone]   12/24/2017, 2:17 PM This note was partially dictated with voice recognition software. Similar sounding words can be transcribed inadequately or may not  be corrected upon review.

## 2018-02-01 DIAGNOSIS — Z6841 Body Mass Index (BMI) 40.0 and over, adult: Secondary | ICD-10-CM | POA: Diagnosis not present

## 2018-02-01 DIAGNOSIS — E119 Type 2 diabetes mellitus without complications: Secondary | ICD-10-CM | POA: Diagnosis not present

## 2018-02-01 DIAGNOSIS — I10 Essential (primary) hypertension: Secondary | ICD-10-CM | POA: Diagnosis not present

## 2018-02-04 ENCOUNTER — Telehealth: Payer: Self-pay | Admitting: "Endocrinology

## 2018-02-04 NOTE — Telephone Encounter (Signed)
Joel Ward is calling stating that he is completely out of SitaGLIPtin-MetFORMIN HCl (JANUMET XR) 50-1000 MG TB24  Please advise?

## 2018-02-07 MED ORDER — SITAGLIP PHOS-METFORMIN HCL ER 50-1000 MG PO TB24: 1.0000 | ORAL_TABLET | Freq: Every day | ORAL | 3 refills | Status: DC

## 2018-03-01 DIAGNOSIS — E782 Mixed hyperlipidemia: Secondary | ICD-10-CM | POA: Diagnosis not present

## 2018-03-01 DIAGNOSIS — E1159 Type 2 diabetes mellitus with other circulatory complications: Secondary | ICD-10-CM | POA: Diagnosis not present

## 2018-03-02 LAB — COMPLETE METABOLIC PANEL WITH GFR
AG RATIO: 1.5 (calc) (ref 1.0–2.5)
ALBUMIN MSPROF: 4.1 g/dL (ref 3.6–5.1)
ALT: 23 U/L (ref 9–46)
AST: 17 U/L (ref 10–35)
Alkaline phosphatase (APISO): 77 U/L (ref 40–115)
BUN: 18 mg/dL (ref 7–25)
CHLORIDE: 102 mmol/L (ref 98–110)
CO2: 26 mmol/L (ref 20–32)
Calcium: 9.1 mg/dL (ref 8.6–10.3)
Creat: 1.05 mg/dL (ref 0.70–1.33)
GFR, EST AFRICAN AMERICAN: 95 mL/min/{1.73_m2} (ref >=60)
GFR, Est Non African American: 82 mL/min/{1.73_m2} (ref >=60)
GLOBULIN: 2.7 g/dL (ref 1.9–3.7)
Glucose, Bld: 195 mg/dL — ABNORMAL HIGH (ref 65–99)
POTASSIUM: 4.2 mmol/L (ref 3.5–5.3)
SODIUM: 135 mmol/L (ref 135–146)
TOTAL PROTEIN: 6.8 g/dL (ref 6.1–8.1)
Total Bilirubin: 0.6 mg/dL (ref 0.2–1.2)

## 2018-03-02 LAB — LIPID PANEL
Cholesterol: 126 mg/dL (ref <200)
HDL: 38 mg/dL — ABNORMAL LOW (ref >40)
LDL Cholesterol (Calc): 66 mg/dL (calc)
Non-HDL Cholesterol (Calc): 88 mg/dL (calc) (ref <130)
Total CHOL/HDL Ratio: 3.3 (calc) (ref <5.0)
Triglycerides: 141 mg/dL (ref <150)

## 2018-03-02 LAB — MICROALBUMIN / CREATININE URINE RATIO
CREATININE, URINE: 104 mg/dL (ref 20–320)
MICROALB/CREAT RATIO: 5 ug/mg{creat} (ref <30)
Microalb, Ur: 0.5 mg/dL

## 2018-03-02 LAB — HEMOGLOBIN A1C
Hgb A1c MFr Bld: 8.4 % of total Hgb — ABNORMAL HIGH (ref <5.7)
Mean Plasma Glucose: 194 (calc)
eAG (mmol/L): 10.8 (calc)

## 2018-03-02 LAB — VITAMIN D 25 HYDROXY (VIT D DEFICIENCY, FRACTURES): Vit D, 25-Hydroxy: 11 ng/mL — ABNORMAL LOW (ref 30–100)

## 2018-03-03 ENCOUNTER — Encounter: Payer: Self-pay | Admitting: "Endocrinology

## 2018-03-03 ENCOUNTER — Ambulatory Visit (INDEPENDENT_AMBULATORY_CARE_PROVIDER_SITE_OTHER): Payer: BLUE CROSS/BLUE SHIELD | Admitting: "Endocrinology

## 2018-03-03 VITALS — BP 137/84 | HR 89 | Ht 71.0 in | Wt 292.0 lb

## 2018-03-03 DIAGNOSIS — E559 Vitamin D deficiency, unspecified: Secondary | ICD-10-CM | POA: Insufficient documentation

## 2018-03-03 DIAGNOSIS — Z6836 Body mass index (BMI) 36.0-36.9, adult: Secondary | ICD-10-CM

## 2018-03-03 DIAGNOSIS — I10 Essential (primary) hypertension: Secondary | ICD-10-CM | POA: Diagnosis not present

## 2018-03-03 DIAGNOSIS — E782 Mixed hyperlipidemia: Secondary | ICD-10-CM

## 2018-03-03 DIAGNOSIS — E1159 Type 2 diabetes mellitus with other circulatory complications: Secondary | ICD-10-CM | POA: Diagnosis not present

## 2018-03-03 HISTORY — DX: Vitamin D deficiency, unspecified: E55.9

## 2018-03-03 MED ORDER — VITAMIN D (ERGOCALCIFEROL) 1.25 MG (50000 UNIT) PO CAPS: 50000.0000 [IU] | ORAL_CAPSULE | ORAL | 0 refills | Status: DC

## 2018-03-03 NOTE — Patient Instructions (Signed)

## 2018-03-03 NOTE — Progress Notes (Signed)
Endocrinology follow-up note  Subjective:    [name], male    DOB: Aug 21, 1965.  he is being seen in follow up  for management of currently uncontrolled symptomatic uncontrolled type 2 diabetes, hyperlipidemia, hypertension. PMD:   Elfredia Nevins, MD.   Past Medical History:  Diagnosis Date  . CAD (coronary artery disease), native coronary artery    10/18 PCI/DES mLAD, normal EF on LV gram  . Diabetes mellitus    Type 2 diagnosed 2 weeks ago  . Hiatal hernia with gastroesophageal reflux   . Hypertension   . Mixed hyperlipidemia   . Morbid obesity (HCC)   . OSA on CPAP   . Urinary frequency    Past Surgical History:  Procedure Laterality Date  . CIRCUMCISION     1988  . CORONARY STENT INTERVENTION N/A 03/16/2017   Procedure: CORONARY STENT INTERVENTION;  Surgeon: Lyn Records, MD;  Location: [facility] INVASIVE CV LAB;  Service: Cardiovascular;  Laterality: N/A;  . ESOPHAGEAL DILATION N/A 02/08/2015   Procedure: ESOPHAGEAL DILATION;  Surgeon: Malissa Hippo, MD;  Location: [facility]O [facility];  Service: Endoscopy;  Laterality: N/A;  . ESOPHAGOGASTRODUODENOSCOPY N/A 02/08/2015   Procedure: ESOPHAGOGASTRODUODENOSCOPY (EGD);  Surgeon: Malissa Hippo, MD;  Location: [facility];  Service: Endoscopy;  Laterality: N/A;  1055  . ESOPHAGOGASTRODUODENOSCOPY (EGD) WITH ESOPHAGEAL DILATION  03/29/2012   Procedure: ESOPHAGOGASTRODUODENOSCOPY (EGD) WITH ESOPHAGEAL DILATION;  Surgeon: Malissa Hippo, MD;  Location: [facility];  Service: Endoscopy;  Laterality: N/A;  730  . LEFT HEART CATH AND CORONARY ANGIOGRAPHY N/A 03/16/2017   Procedure: LEFT HEART CATH AND CORONARY ANGIOGRAPHY;  Surgeon: Lyn Records, MD;  Location: [facility] INVASIVE CV LAB;  Service: Cardiovascular;  Laterality: N/A;  . ULTRASOUND GUIDANCE FOR VASCULAR ACCESS  03/16/2017   Procedure: Ultrasound Guidance For Vascular Access;  Surgeon: Lyn Records, MD;  Location: [facility] INVASIVE CV LAB;  Service:  Cardiovascular;;   Social History   Socioeconomic History  . Marital status: Married    Spouse name: Not on file  . Number of children: Not on file  . Years of education: Not on file  . Highest education level: Not on file  Occupational History  . Not on file  Social Needs  . Financial resource strain: Not on file  . Food insecurity:    Worry: Not on file    Inability: Not on file  . Transportation needs:    Medical: Not on file    Non-medical: Not on file  Tobacco Use  . Smoking status: Never Smoker  . Smokeless tobacco: Never Used  Substance and Sexual Activity  . Alcohol use: No  . Drug use: No  . Sexual activity: Not on file  Lifestyle  . Physical activity:    Days per week: Not on file    Minutes per session: Not on file  . Stress: Not on file  Relationships  . Social connections:    Talks on phone: Not on file    Gets together: Not on file    Attends religious service: Not on file    Active member of club or organization: Not on file    Attends meetings of clubs or organizations: Not on file    Relationship status: Not on file  Other Topics Concern  . Not on file  Social History Narrative   Drinks caffeine 4 times a week.      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   Outpatient Encounter Medications  as of 03/03/2018  Medication Sig  . aspirin 81 MG chewable tablet Chew 1 tablet (81 mg total) by mouth daily.  [name] atorvastatin (LIPITOR) 40 MG tablet TAKE 1 TABLET BY MOUTH ONCE DAILY AT  6  PM  . cyclobenzaprine (FLEXERIL) 10 MG tablet Take 10 mg by mouth 3 (three) times daily as needed. for muscle spams  . esomeprazole (NEXIUM) 20 MG capsule Take 20 mg by mouth daily as needed (acid reflux).   . Homeopathic Products (SIMILASAN PINK EYE RELIEF) SOLN Apply 1 drop to eye daily as needed (redness).  [name] ketotifen (ZADITOR) 0.025 % ophthalmic solution Place 2 drops into both eyes daily as needed (allergies).  [name] lisinopril (PRINIVIL,ZESTRIL) 10 MG tablet Take 1 tablet (10 mg total)  by mouth daily.  . Melatonin 2.5 MG CAPS Take 2.5 mg by mouth at bedtime as needed (sleep).  . nitroGLYCERIN (NITROSTAT) 0.4 MG SL tablet Place 1 tablet (0.4 mg total) under the tongue every 5 (five) minutes as needed. (Patient not taking: Reported on 08/05/2017)  . sildenafil (VIAGRA) 100 MG tablet Take 100 mg by mouth as needed.  . SitaGLIPtin-MetFORMIN HCl (JANUMET XR) 50-1000 MG TB24 Take 1 tablet by mouth daily after breakfast.  . Vitamin D, Ergocalciferol, (DRISDOL) 50000 units CAPS capsule Take 1 capsule (50,000 Units total) by mouth every 7 (seven) days.  . [DISCONTINUED] ticagrelor (BRILINTA) 90 MG TABS tablet Take 1 tablet (90 mg total) by mouth 2 (two) times daily.   No facility-administered encounter medications on file as of 03/03/2018.     ALLERGIES: Allergies  Allergen Reactions  . Shellfish Allergy Swelling    Turns red  . Strawberry Extract Rash    VACCINATION STATUS:  There is no immunization history on file for this patient.  Diabetes  He presents for his follow-up diabetic visit. He has type 2 diabetes mellitus. Onset time: He reports that he was diagnosed at approximate age of 85 years. His disease course has been worsening. There are no hypoglycemic associated symptoms. Pertinent negatives for hypoglycemia include no confusion, headaches, pallor, seizures or tremors. Pertinent negatives for diabetes include no blurred vision, no chest pain, no fatigue, no polydipsia, no polyphagia, no polyuria and no weakness. There are no hypoglycemic complications. Symptoms are worsening. Diabetic complications include heart disease. Risk factors for coronary artery disease include dyslipidemia, diabetes mellitus, family history, hypertension, male sex, obesity and sedentary lifestyle. Current diabetic treatment includes oral agent (dual therapy) (She is taking Janumet 50/1000 mg once a day, metformin 1000 mg once a day.). His weight is fluctuating minimally. He is following a diabetic  diet. When asked about meal planning, he reported none. He has not had a previous visit with a dietitian. He participates in exercise intermittently. (He does not monitor blood glucose .  He returns with higher A1c of 8.4%.) An ACE inhibitor/angiotensin II receptor blocker is being taken. He does not see a podiatrist.Eye exam is not current.  Hyperlipidemia  This is a chronic problem. The current episode started more than 1 year ago. The problem is controlled. Exacerbating diseases include diabetes and obesity. Pertinent negatives include no chest pain, myalgias or shortness of breath. Current antihyperlipidemic treatment includes statins. Risk factors for coronary artery disease include dyslipidemia, diabetes mellitus, hypertension, male sex, obesity, a sedentary lifestyle and family history.  Hypertension  This is a chronic problem. The problem is controlled. Pertinent negatives include no blurred vision, chest pain, headaches, neck pain, palpitations or shortness of breath. Risk factors for coronary artery disease  include diabetes mellitus, dyslipidemia, obesity, male gender, sedentary lifestyle and family history. Past treatments include ACE inhibitors. Hypertensive end-organ damage includes CAD/MI.     Review of Systems  Constitutional: Negative for chills, fatigue, fever and unexpected weight change.  HENT: Negative for dental problem, mouth sores and trouble swallowing.   Eyes: Negative for blurred vision and visual disturbance.  Respiratory: Negative for cough, choking, chest tightness, shortness of breath and wheezing.   Cardiovascular: Negative for chest pain, palpitations and leg swelling.       No Shortness of breath  Gastrointestinal: Negative for abdominal distention, abdominal pain, constipation, diarrhea, nausea and vomiting.  Endocrine: Negative for polydipsia, polyphagia and polyuria.  Genitourinary: Negative for dysuria, flank pain, frequency, hematuria and urgency.   Musculoskeletal: Negative for back pain, gait problem, myalgias and neck pain.  Skin: Negative for pallor, rash and wound.  Neurological: Negative for tremors, seizures, syncope, weakness, numbness and headaches.  Hematological: Does not bruise/bleed easily.  Psychiatric/Behavioral: Negative for confusion, dysphoric mood, hallucinations and suicidal ideas.    Objective:    BP 137/84   Pulse 89   Ht 5\' 11"  (1.803 m)   Wt 292 lb (132.5 kg)   BMI 40.73 kg/m   Wt Readings from Last 3 Encounters:  03/03/18 292 lb (132.5 kg)  12/24/17 291 lb (132 kg)  08/05/17 267 lb (121.1 kg)     Physical Exam  Constitutional: He is oriented to person, place, and time. He appears well-developed. He is cooperative. No distress.  HENT:  Head: Normocephalic and atraumatic.  Eyes: EOM are normal.  Neck: Normal range of motion. Neck supple. No tracheal deviation present. No thyromegaly present.  Cardiovascular: Normal rate, S1 normal, S2 normal and normal heart sounds. Exam reveals no gallop.  No murmur heard. Pulses:      Dorsalis pedis pulses are 1+ on the right side, and 1+ on the left side.       Posterior tibial pulses are 1+ on the right side, and 1+ on the left side.  Pulmonary/Chest: Effort normal. No respiratory distress. He has no wheezes.  Abdominal: He exhibits no distension. There is no tenderness. There is no guarding and no CVA tenderness.  Musculoskeletal: He exhibits no edema.       Right shoulder: He exhibits no swelling and no deformity.  Neurological: He is alert and oriented to person, place, and time. He has normal strength. No cranial nerve deficit or sensory deficit. Gait normal.  Skin: Skin is warm and dry. No rash noted. No cyanosis. Nails show no clubbing.  Psychiatric: He has a normal mood and affect. His speech is normal. Thought content normal. Cognition and memory are normal.    CMP ( most recent) CMP     Component Value Date/Time   NA 135 03/01/2018 0846   NA 140  03/22/2017 1549   K 4.2 03/01/2018 0846   CL 102 03/01/2018 0846   CO2 26 03/01/2018 0846   GLUCOSE 195 (H) 03/01/2018 0846   BUN 18 03/01/2018 0846   BUN 14 03/22/2017 1549   CREATININE 1.05 03/01/2018 0846   CALCIUM 9.1 03/01/2018 0846   PROT 6.8 03/01/2018 0846   PROT 8.3 03/22/2017 1549   ALBUMIN 4.2 06/29/2017 0807   ALBUMIN 4.6 03/22/2017 1549   AST 17 03/01/2018 0846   ALT 23 03/01/2018 0846   ALKPHOS 60 06/29/2017 0807   BILITOT 0.6 03/01/2018 0846   BILITOT 0.4 03/22/2017 1549   GFRNONAA 82 03/01/2018 0846   GFRAA 95  03/01/2018 0846     Diabetic Labs (most recent): Lab Results  Component Value Date   HGBA1C 8.4 (H) 03/01/2018   HGBA1C 6.8 (H) 12/20/2017   HGBA1C 4.6 07/06/2017     Lipid Panel ( most recent) Lipid Panel     Component Value Date/Time   CHOL 126 03/01/2018 0846   CHOL 151 03/22/2017 1549   TRIG 141 03/01/2018 0846   HDL 38 (L) 03/01/2018 0846   HDL 47 03/22/2017 1549   CHOLHDL 3.3 03/01/2018 0846   VLDL 21 06/29/2017 0807   LDLCALC 66 03/01/2018 0846     Assessment & Plan:   1. DM type 2 causing vascular disease (HCC) - Patient has currently uncontrolled symptomatic type 2 DM since  52 years of age.  He came with loss of control of diabetes with A1c of 0.4% increasing progressively from 4.6%.   -He has regained 26 pounds after he lost 30 pounds.      Recent labs reviewed with him showing normal renal function.  Is found to have vitamin D deficiency.  -his diabetes is complicated by coronary artery disease with stent placement on 03/15/2017 and MING MCMANNIS remains at a high risk for more acute and chronic complications which include CAD, CVA, CKD, retinopathy, and neuropathy. These are all discussed in detail with the patient.  - I have counseled him on diet management and weight loss, by adopting a carbohydrate restricted/protein rich diet. -He did admits to dietary indiscretions including consumption of sweets and sweetened  beverages.    -  Suggestion is made for him to avoid simple carbohydrates  from his diet including Cakes, Sweet Desserts / Pastries, Ice Cream, Soda (diet and regular), Sweet Tea, Candies, Chips, Cookies, Store Bought Juices, Alcohol in Excess of  1-2 drinks a day, Artificial Sweeteners, and "Sugar-free" Products. This will help patient to have stable blood glucose profile and potentially avoid unintended weight gain.   - I encouraged him to switch to  unprocessed or minimally processed complex starch and increased protein intake (animal or plant source), fruits, and vegetables.  - he is advised to stick to a routine mealtimes to eat 3 meals  a day and avoid unnecessary snacks ( to snack only to correct hypoglycemia).   - he will be scheduled with Norm Salt, RDN, CDE for individualized DM education- consult pending.  - I have approached him with the following individualized plan to manage diabetes and patient agrees:   -He explains that he did not have his Janumet for 3 weeks in the interim which might have abated to his loss of control.  He wishes to avoid insulin treatment for now. -I approached him for re-engagement with monitoring of blood glucose on fasting and report if readings are above 200 mg/dL.   -He is advised to continue Janumet 50/1000 mg ER once a day after breakfast. -He will be considered for basal insulin if his A1c is greater than 9% on subsequent visits.  - Patient specific target  A1c;  LDL, HDL, Triglycerides, and  Waist Circumference were discussed in detail.  2) BP/HTN: His blood pressure is controlled to target.    He will continue his blood pressure medications including lisinopril 10 mg p.o. daily.   3) Lipids/HPL: His recent lipid panel showed controlled LDL at 54.  He is advised to continue atorvastatin 40 mg p.o. nightly.     4)  Weight/Diet: Obese with a BMI of 40-clearly complicating his diabetes care.   I discussed with  him the fact that loss of 5 -  10% of his current body weight will have the most impact on hisdiabetes management.   CDE Consult has been initiated , exercise, and detailed carbohydrates information provided.  5) vitamin D deficiency: New diagnosis  Discussed and initiated vitamin D supplement with ergocalciferol 50,000 units weekly for the next 4 weeks.  6) Chronic Care/Health Maintenance:  -he  is on ACEI/ARB and Statin medications and  is encouraged to continue to follow up with Ophthalmology, Dentist,  Podiatrist at least yearly or according to recommendations, and advised to  stay away from smoking. I have recommended yearly flu vaccine and pneumonia vaccination at least every 5 years; moderate intensity exercise for up to 150 minutes weekly; and  sleep for at least 7 hours a day.  - I advised patient to maintain close follow up with Elfredia Nevins, MD for primary care needs.  - Time spent with the patient: 25 min, of which >50% was spent in reviewing his  current and  previous labs, previous treatments, and medications doses and developing a plan for long-term care.  Army Fossa Hollerbach participated in the discussions, expressed understanding, and voiced agreement with the above plans.  All questions were answered to his satisfaction. he is encouraged to contact clinic should he have any questions or concerns prior to his return visit.   Follow up plan: - Return in about 3 months (around 06/03/2018) for Follow up with Meter and Logs Only - no Labs.  Marquis Lunch, MD Phone: [phone]  Fax: [phone]   03/03/2018, 10:17 AM This note was partially dictated with voice recognition software. Similar sounding words can be transcribed inadequately or may not  be corrected upon review.

## 2018-03-17 ENCOUNTER — Emergency Department (HOSPITAL_COMMUNITY): Payer: BLUE CROSS/BLUE SHIELD

## 2018-03-17 ENCOUNTER — Other Ambulatory Visit: Payer: Self-pay

## 2018-03-17 ENCOUNTER — Encounter (HOSPITAL_COMMUNITY): Payer: Self-pay | Admitting: Emergency Medicine

## 2018-03-17 ENCOUNTER — Emergency Department (HOSPITAL_COMMUNITY)
Admission: EM | Admit: 2018-03-17 | Discharge: 2018-03-17 | Disposition: A | Discharge status: Home / Self Care | Payer: BLUE CROSS/BLUE SHIELD | Attending: Emergency Medicine | Admitting: Emergency Medicine

## 2018-03-17 DIAGNOSIS — I10 Essential (primary) hypertension: Secondary | ICD-10-CM | POA: Diagnosis not present

## 2018-03-17 DIAGNOSIS — E119 Type 2 diabetes mellitus without complications: Secondary | ICD-10-CM | POA: Insufficient documentation

## 2018-03-17 DIAGNOSIS — Y92410 Unspecified street and highway as the place of occurrence of the external cause: Secondary | ICD-10-CM | POA: Insufficient documentation

## 2018-03-17 DIAGNOSIS — Y9389 Activity, other specified: Secondary | ICD-10-CM | POA: Diagnosis not present

## 2018-03-17 DIAGNOSIS — S2232XA Fracture of one rib, left side, initial encounter for closed fracture: Secondary | ICD-10-CM | POA: Insufficient documentation

## 2018-03-17 DIAGNOSIS — Y999 Unspecified external cause status: Secondary | ICD-10-CM | POA: Diagnosis not present

## 2018-03-17 DIAGNOSIS — I2510 Atherosclerotic heart disease of native coronary artery without angina pectoris: Secondary | ICD-10-CM | POA: Diagnosis not present

## 2018-03-17 DIAGNOSIS — S299XXA Unspecified injury of thorax, initial encounter: Secondary | ICD-10-CM | POA: Diagnosis not present

## 2018-03-17 MED ORDER — OXYCODONE-ACETAMINOPHEN 5-325 MG PO TABS
1.0000 | ORAL_TABLET | Freq: Once | ORAL | Status: AC
  Administered 2018-03-17: 1 via ORAL
  Filled 2018-03-17: qty 1

## 2018-03-17 MED ORDER — OXYCODONE-ACETAMINOPHEN 5-325 MG PO TABS: 1.0000 | ORAL_TABLET | Freq: Four times a day (QID) | ORAL | 0 refills | Status: DC | PRN

## 2018-03-17 MED ORDER — IBUPROFEN 800 MG PO TABS
800.0000 mg | ORAL_TABLET | Freq: Once | ORAL | Status: AC
  Administered 2018-03-17: 800 mg via ORAL
  Filled 2018-03-17: qty 1

## 2018-03-17 MED ORDER — IBUPROFEN 800 MG PO TABS: 800.0000 mg | ORAL_TABLET | Freq: Three times a day (TID) | ORAL | 0 refills | Status: DC | PRN
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
## 2018-03-17 NOTE — Discharge Instructions (Signed)
Your workup today showed that you have a fracture to one or more ribs.  Unfortunately this type of injury hurts but there is no way to fix it immediately; it must heal over time.  Be sure to take plenty of deep breaths so that you get rid of the "bad air" in your lungs.  If you are given a device called an incentive spirometer, please use it as recommended.  Unless you have been told by your doctor not to do so, we recommend you take ibuprofen 600 mg 3 times daily with meals for no more than 5 days.  You can also take Tylenol 1000 mg every 6 hours for pain.  Follow-up at the clinics or with the doctors described in this paperwork.  Return to the emergency department if he develop new or worsening symptoms that concern you.   Rib Fracture A rib fracture is a break or crack in one of the bones of the ribs. The ribs are a group of Florrie Ramires, curved bones that wrap around your chest and attach to your spine. They protect your lungs and other organs in the chest cavity. A broken or cracked rib is often painful, but most do not cause other problems. Most rib fractures heal on their own over time. However, rib fractures can be more serious if multiple ribs are broken or if broken ribs move out of place and push against other structures. CAUSES  A direct blow to the chest. For example, this could happen during contact sports, a car accident, or a fall against a hard object. Repetitive movements with high force, such as pitching a baseball or having severe coughing spells. SYMPTOMS  Pain when you breathe in or cough. Pain when someone presses on the injured area. DIAGNOSIS  Your caregiver will perform a physical exam. Various imaging tests may be ordered to confirm the diagnosis and to look for related injuries. These tests may include a chest X-ray, computed tomography (CT), magnetic resonance imaging (MRI), or a bone scan. TREATMENT  Rib fractures usually heal on their own in 1-3 months. The longer healing  period is often associated with a continued cough or other aggravating activities. During the healing period, pain control is very important. Medication is usually given to control pain. Hospitalization or surgery may be needed for more severe injuries, such as those in which multiple ribs are broken or the ribs have moved out of place.  HOME CARE INSTRUCTIONS  Avoid strenuous activity and any activities or movements that cause pain. Be careful during activities and avoid bumping the injured rib. Gradually increase activity as directed by your caregiver. Only take over-the-counter or prescription medications as directed by your caregiver. Do not take other medications without asking your caregiver first. Apply ice to the injured area for the first 1-2 days after you have been treated or as directed by your caregiver. Applying ice helps to reduce inflammation and pain. Put ice in a plastic bag. Place a towel between your skin and the bag.   Leave the ice on for 15-20 minutes at a time, every 2 hours while you are awake. Perform deep breathing as directed by your caregiver. This will help prevent pneumonia, which is a common complication of a broken rib. Your caregiver may instruct you to: Take deep breaths several times a day. Try to cough several times a day, holding a pillow against the injured area. Use a device called an incentive spirometer to practice deep breathing several times a day. Drink   enough fluids to keep your urine clear or pale yellow. This will help you avoid constipation.   Do not wear a rib belt or binder. These restrict breathing, which can lead to pneumonia.   SEEK IMMEDIATE MEDICAL CARE IF:  You have a fever.   You have difficulty breathing or shortness of breath.   You develop a continual cough, or you cough up thick or bloody sputum. You feel sick to your stomach (nausea), throw up (vomit), or have abdominal pain.   You have worsening pain not controlled with medications.    MAKE SURE YOU: Understand these instructions. Will watch your condition. Will get help right away if you are not doing well or get worse. Document Released: 05/18/2005 Document Revised: 01/18/2013 Document Reviewed: 07/20/2012 ExitCare Patient Information 2015 [employer]. This information is not intended to replace advice given to you by your health care provider. Make sure you discuss any questions you have with your health care provider.   

## 2018-03-22 DIAGNOSIS — I10 Essential (primary) hypertension: Secondary | ICD-10-CM | POA: Diagnosis not present

## 2018-03-22 DIAGNOSIS — S2232XD Fracture of one rib, left side, subsequent encounter for fracture with routine healing: Secondary | ICD-10-CM | POA: Diagnosis not present

## 2018-03-22 DIAGNOSIS — R0781 Pleurodynia: Secondary | ICD-10-CM | POA: Diagnosis not present

## 2018-03-22 DIAGNOSIS — Z6841 Body Mass Index (BMI) 40.0 and over, adult: Secondary | ICD-10-CM | POA: Diagnosis not present

## 2018-06-09 ENCOUNTER — Ambulatory Visit: Payer: BLUE CROSS/BLUE SHIELD | Admitting: "Endocrinology

## 2018-06-28 ENCOUNTER — Other Ambulatory Visit: Payer: Self-pay | Admitting: "Endocrinology

## 2018-06-29 DIAGNOSIS — E559 Vitamin D deficiency, unspecified: Secondary | ICD-10-CM | POA: Diagnosis not present

## 2018-06-29 DIAGNOSIS — Z6836 Body mass index (BMI) 36.0-36.9, adult: Secondary | ICD-10-CM | POA: Diagnosis not present

## 2018-06-29 DIAGNOSIS — E1159 Type 2 diabetes mellitus with other circulatory complications: Secondary | ICD-10-CM | POA: Diagnosis not present

## 2018-06-30 LAB — COMPLETE METABOLIC PANEL WITH GFR
AG Ratio: 1.5 (calc) (ref 1.0–2.5)
ALBUMIN MSPROF: 4.4 g/dL (ref 3.6–5.1)
ALKALINE PHOSPHATASE (APISO): 91 U/L (ref 40–115)
ALT: 28 U/L (ref 9–46)
AST: 17 U/L (ref 10–35)
BILIRUBIN TOTAL: 0.5 mg/dL (ref 0.2–1.2)
BUN: 14 mg/dL (ref 7–25)
CHLORIDE: 101 mmol/L (ref 98–110)
CO2: 27 mmol/L (ref 20–32)
CREATININE: 0.97 mg/dL (ref 0.70–1.33)
Calcium: 9.7 mg/dL (ref 8.6–10.3)
GFR, Est African American: 105 mL/min/{1.73_m2} (ref >=60)
GFR, Est Non African American: 91 mL/min/{1.73_m2} (ref >=60)
GLUCOSE: 266 mg/dL — AB (ref 65–99)
Globulin: 3 g/dL (calc) (ref 1.9–3.7)
Potassium: 4.2 mmol/L (ref 3.5–5.3)
Sodium: 137 mmol/L (ref 135–146)
Total Protein: 7.4 g/dL (ref 6.1–8.1)

## 2018-06-30 LAB — HEMOGLOBIN A1C
HEMOGLOBIN A1C: 9.4 %{Hb} — AB (ref <5.7)
Mean Plasma Glucose: 223 (calc)
eAG (mmol/L): 12.4 (calc)

## 2018-06-30 LAB — T4, FREE: FREE T4: 1.1 ng/dL (ref 0.8–1.8)

## 2018-06-30 LAB — TSH: TSH: 1.48 m[IU]/L (ref 0.40–4.50)

## 2018-06-30 LAB — VITAMIN D 25 HYDROXY (VIT D DEFICIENCY, FRACTURES): VIT D 25 HYDROXY: 25 ng/mL — AB (ref 30–100)

## 2018-07-01 ENCOUNTER — Ambulatory Visit (INDEPENDENT_AMBULATORY_CARE_PROVIDER_SITE_OTHER): Payer: BLUE CROSS/BLUE SHIELD | Admitting: "Endocrinology

## 2018-07-01 ENCOUNTER — Other Ambulatory Visit: Payer: Self-pay

## 2018-07-01 ENCOUNTER — Encounter: Payer: Self-pay | Admitting: "Endocrinology

## 2018-07-01 VITALS — BP 133/87 | HR 74 | Ht 71.0 in | Wt 296.0 lb

## 2018-07-01 DIAGNOSIS — E1159 Type 2 diabetes mellitus with other circulatory complications: Secondary | ICD-10-CM

## 2018-07-01 DIAGNOSIS — Z6836 Body mass index (BMI) 36.0-36.9, adult: Secondary | ICD-10-CM

## 2018-07-01 DIAGNOSIS — I10 Essential (primary) hypertension: Secondary | ICD-10-CM

## 2018-07-01 DIAGNOSIS — E782 Mixed hyperlipidemia: Secondary | ICD-10-CM | POA: Diagnosis not present

## 2018-07-01 MED ORDER — SITAGLIP PHOS-METFORMIN HCL ER 50-1000 MG PO TB24: ORAL_TABLET | ORAL | 2 refills | Status: DC

## 2018-07-01 MED ORDER — INSULIN GLARGINE (1 UNIT DIAL) 300 UNIT/ML ~~LOC~~ SOPN: 20.0000 [IU] | PEN_INJECTOR | Freq: Every day | SUBCUTANEOUS | 2 refills | Status: DC

## 2018-07-01 MED ORDER — INSULIN DEGLUDEC 100 UNIT/ML ~~LOC~~ SOPN: 20.0000 [IU] | PEN_INJECTOR | Freq: Every day | SUBCUTANEOUS | 2 refills | Status: DC

## 2018-07-01 MED ORDER — INSULIN PEN NEEDLE 31G X 8 MM MISC: 1.0000 | 3 refills | Status: DC

## 2018-07-01 NOTE — Patient Instructions (Signed)

## 2018-07-01 NOTE — Progress Notes (Signed)
Endocrinology follow-up note  Subjective:    [name], male    DOB: [birth year].  he is being seen in follow up  for management of currently uncontrolled symptomatic uncontrolled type 2 diabetes, hyperlipidemia, hypertension.  PMD:   Elfredia Nevins, MD.   Past Medical History:  Diagnosis Date  . CAD (coronary artery disease), native coronary artery    10/18 PCI/DES mLAD, normal EF on LV gram  . Diabetes mellitus    Type 2 diagnosed 2 weeks ago  . Hiatal hernia with gastroesophageal reflux   . Hypertension   . Mixed hyperlipidemia   . Morbid obesity (HCC)   . OSA on CPAP   . Urinary frequency    Past Surgical History:  Procedure Laterality Date  . CIRCUMCISION     1988  . CORONARY STENT INTERVENTION N/A 03/16/2017   Procedure: CORONARY STENT INTERVENTION;  Surgeon: Lyn Records, MD;  Location: [facility] INVASIVE CV LAB;  Service: Cardiovascular;  Laterality: N/A;  . ESOPHAGEAL DILATION N/A 02/08/2015   Procedure: ESOPHAGEAL DILATION;  Surgeon: Malissa Hippo, MD;  Location: [facility] ENDO SUITE;  Service: Endoscopy;  Laterality: N/A;  . ESOPHAGOGASTRODUODENOSCOPY N/A 02/08/2015   Procedure: ESOPHAGOGASTRODUODENOSCOPY (EGD);  Surgeon: Malissa Hippo, MD;  Location: [facility] ENDO SUITE;  Service: Endoscopy;  Laterality: N/A;  1055  . ESOPHAGOGASTRODUODENOSCOPY (EGD) WITH ESOPHAGEAL DILATION  03/29/2012   Procedure: ESOPHAGOGASTRODUODENOSCOPY (EGD) WITH ESOPHAGEAL DILATION;  Surgeon: Malissa Hippo, MD;  Location: [facility] ENDO SUITE;  Service: Endoscopy;  Laterality: N/A;  730  . LEFT HEART CATH AND CORONARY ANGIOGRAPHY N/A 03/16/2017   Procedure: LEFT HEART CATH AND CORONARY ANGIOGRAPHY;  Surgeon: Lyn Records, MD;  Location: [facility] INVASIVE CV LAB;  Service: Cardiovascular;  Laterality: N/A;  . ULTRASOUND GUIDANCE FOR VASCULAR ACCESS  03/16/2017   Procedure: Ultrasound Guidance For Vascular Access;  Surgeon: Lyn Records, MD;  Location: [facility] INVASIVE CV LAB;  Service:  Cardiovascular;;   Social History   Socioeconomic History  . Marital status: Married    Spouse name: Not on file  . Number of children: Not on file  . Years of education: Not on file  . Highest education level: Not on file  Occupational History  . Not on file  Social Needs  . Financial resource strain: Not on file  . Food insecurity:    Worry: Not on file    Inability: Not on file  . Transportation needs:    Medical: Not on file    Non-medical: Not on file  Tobacco Use  . Smoking status: Never Smoker  . Smokeless tobacco: Never Used  Substance and Sexual Activity  . Alcohol use: No  . Drug use: No  . Sexual activity: Not on file  Lifestyle  . Physical activity:    Days per week: Not on file    Minutes per session: Not on file  . Stress: Not on file  Relationships  . Social connections:    Talks on phone: Not on file    Gets together: Not on file    Attends religious service: Not on file    Active member of club or organization: Not on file    Attends meetings of clubs or organizations: Not on file    Relationship status: Not on file  Other Topics Concern  . Not on file  Social History Narrative   Drinks caffeine 4 times a week.      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   Outpatient Encounter  Medications as of 07/01/2018  Medication Sig  . aspirin 81 MG chewable tablet Chew 1 tablet (81 mg total) by mouth daily.  [name] atorvastatin (LIPITOR) 40 MG tablet TAKE 1 TABLET BY MOUTH ONCE DAILY AT  6  PM  . insulin degludec (TRESIBA FLEXTOUCH) 100 UNIT/ML SOPN FlexTouch Pen Inject 0.2 mLs (20 Units total) into the skin daily.  . Insulin Pen Needle (B-D ULTRAFINE III SHORT PEN) 31G X 8 MM MISC 1 each by Does not apply route as directed.  [name] lisinopril (PRINIVIL,ZESTRIL) 10 MG tablet Take 1 tablet (10 mg total) by mouth daily.  . Melatonin 2.5 MG CAPS Take 2.5 mg by mouth at bedtime as needed (sleep).  . nitroGLYCERIN (NITROSTAT) 0.4 MG SL tablet Place 1 tablet (0.4 mg total) under the  tongue every 5 (five) minutes as needed. (Patient not taking: Reported on 08/05/2017)  . sildenafil (VIAGRA) 100 MG tablet Take 100 mg by mouth as needed.  . SitaGLIPtin-MetFORMIN HCl (JANUMET XR) 50-1000 MG TB24 TAKE 1 TABLET BY MOUTH ONCE AFTER BREAKFAST  . Vitamin D, Ergocalciferol, (DRISDOL) 50000 units CAPS capsule Take 1 capsule (50,000 Units total) by mouth every 7 (seven) days. (Patient not taking: Reported on 07/01/2018)  . [DISCONTINUED] cyclobenzaprine (FLEXERIL) 10 MG tablet Take 10 mg by mouth 3 (three) times daily as needed. for muscle spams  . [DISCONTINUED] esomeprazole (NEXIUM) 20 MG capsule Take 20 mg by mouth daily as needed (acid reflux).   . [DISCONTINUED] Homeopathic Products (SIMILASAN PINK EYE RELIEF) SOLN Apply 1 drop to eye daily as needed (redness).  . [DISCONTINUED] ibuprofen (ADVIL,MOTRIN) 800 MG tablet Take 1 tablet (800 mg total) by mouth every 8 (eight) hours as needed.  . [DISCONTINUED] JANUMET XR 50-1000 MG TB24 TAKE 1 TABLET BY MOUTH ONCE AFTER BREAKFAST  . [DISCONTINUED] ketotifen (ZADITOR) 0.025 % ophthalmic solution Place 2 drops into both eyes daily as needed (allergies).  . [DISCONTINUED] oxyCODONE-acetaminophen (PERCOCET/ROXICET) 5-325 MG tablet Take 1 tablet by mouth every 6 (six) hours as needed for severe pain.   No facility-administered encounter medications on file as of 07/01/2018.     ALLERGIES: Allergies  Allergen Reactions  . Shellfish Allergy Swelling    Turns red  . Strawberry Extract Rash    VACCINATION STATUS:  There is no immunization history on file for this patient.  Diabetes  He presents for his follow-up diabetic visit. He has type 2 diabetes mellitus. Onset time: He reports that he was diagnosed at approximate age of 60 years. His disease course has been worsening. There are no hypoglycemic associated symptoms. Pertinent negatives for hypoglycemia include no confusion, headaches, pallor, seizures or tremors. Pertinent negatives for  diabetes include no blurred vision, no chest pain, no fatigue, no polydipsia, no polyphagia, no polyuria and no weakness. There are no hypoglycemic complications. Symptoms are worsening. Diabetic complications include heart disease. Risk factors for coronary artery disease include dyslipidemia, diabetes mellitus, family history, hypertension, male sex, obesity and sedentary lifestyle. Current diabetic treatment includes oral agent (dual therapy) (She is taking Janumet 50/1000 mg once a day, metformin 1000 mg once a day.). His weight is increasing steadily. He is following a diabetic diet. When asked about meal planning, he reported none. He has not had a previous visit with a dietitian. He participates in exercise intermittently. (He does not monitor blood glucose .  He returns with worsening course of his diabetes with A1c of 9.4%, slowly increasing from 6.8% ) An ACE inhibitor/angiotensin II receptor blocker is being taken. He does  not see a podiatrist.Eye exam is not current.  Hyperlipidemia  This is a chronic problem. The current episode started more than 1 year ago. The problem is controlled. Exacerbating diseases include diabetes and obesity. Pertinent negatives include no chest pain, myalgias or shortness of breath. Current antihyperlipidemic treatment includes statins. Risk factors for coronary artery disease include dyslipidemia, diabetes mellitus, hypertension, male sex, obesity, a sedentary lifestyle and family history.  Hypertension  This is a chronic problem. The problem is controlled. Pertinent negatives include no blurred vision, chest pain, headaches, neck pain, palpitations or shortness of breath. Risk factors for coronary artery disease include diabetes mellitus, dyslipidemia, obesity, male gender, sedentary lifestyle and family history. Past treatments include ACE inhibitors. Hypertensive end-organ damage includes CAD/MI.     Review of Systems  Constitutional: Negative for chills,  fatigue, fever and unexpected weight change.  HENT: Negative for dental problem, mouth sores and trouble swallowing.   Eyes: Negative for blurred vision and visual disturbance.  Respiratory: Negative for cough, choking, chest tightness, shortness of breath and wheezing.   Cardiovascular: Negative for chest pain, palpitations and leg swelling.       No Shortness of breath  Gastrointestinal: Negative for abdominal distention, abdominal pain, constipation, diarrhea, nausea and vomiting.  Endocrine: Negative for polydipsia, polyphagia and polyuria.  Genitourinary: Negative for dysuria, flank pain, frequency, hematuria and urgency.  Musculoskeletal: Negative for back pain, gait problem, myalgias and neck pain.  Skin: Negative for pallor, rash and wound.  Neurological: Negative for tremors, seizures, syncope, weakness, numbness and headaches.  Hematological: Does not bruise/bleed easily.  Psychiatric/Behavioral: Negative for confusion, dysphoric mood, hallucinations and suicidal ideas.    Objective:    BP 133/87   Pulse 74   Ht 5\' 11"  (1.803 m)   Wt 296 lb (134.3 kg)   BMI 41.28 kg/m   Wt Readings from Last 3 Encounters:  07/01/18 296 lb (134.3 kg)  03/17/18 292 lb (132.5 kg)  03/03/18 292 lb (132.5 kg)     Physical Exam  Constitutional: He is oriented to person, place, and time. He appears well-developed. He is cooperative. No distress.  HENT:  Head: Normocephalic and atraumatic.  Eyes: EOM are normal.  Neck: Normal range of motion. Neck supple. No tracheal deviation present. No thyromegaly present.  Cardiovascular: Normal rate, S1 normal, S2 normal and normal heart sounds. Exam reveals no gallop.  No murmur heard. Pulses:      Dorsalis pedis pulses are 1+ on the right side and 1+ on the left side.       Posterior tibial pulses are 1+ on the right side and 1+ on the left side.  Pulmonary/Chest: Effort normal. No respiratory distress. He has no wheezes.  Abdominal: He exhibits no  distension. There is no abdominal tenderness. There is no guarding and no CVA tenderness.  Musculoskeletal:        General: No edema.     Right shoulder: He exhibits no swelling and no deformity.  Neurological: He is alert and oriented to person, place, and time. He has normal strength. No cranial nerve deficit or sensory deficit. Gait normal.  Skin: Skin is warm and dry. No rash noted. No cyanosis. Nails show no clubbing.  Psychiatric: He has a normal mood and affect. His speech is normal. Thought content normal. Cognition and memory are normal.    CMP ( most recent) CMP     Component Value Date/Time   NA 137 06/29/2018 0920   NA 140 03/22/2017 1549   K  4.2 06/29/2018 0920   CL 101 06/29/2018 0920   CO2 27 06/29/2018 0920   GLUCOSE 266 (H) 06/29/2018 0920   BUN 14 06/29/2018 0920   BUN 14 03/22/2017 1549   CREATININE 0.97 06/29/2018 0920   CALCIUM 9.7 06/29/2018 0920   PROT 7.4 06/29/2018 0920   PROT 8.3 03/22/2017 1549   ALBUMIN 4.2 06/29/2017 0807   ALBUMIN 4.6 03/22/2017 1549   AST 17 06/29/2018 0920   ALT 28 06/29/2018 0920   ALKPHOS 60 06/29/2017 0807   BILITOT 0.5 06/29/2018 0920   BILITOT 0.4 03/22/2017 1549   GFRNONAA 91 06/29/2018 0920   GFRAA 105 06/29/2018 0920     Diabetic Labs (most recent): Lab Results  Component Value Date   HGBA1C 9.4 (H) 06/29/2018   HGBA1C 8.4 (H) 03/01/2018   HGBA1C 6.8 (H) 12/20/2017     Lipid Panel ( most recent) Lipid Panel     Component Value Date/Time   CHOL 126 03/01/2018 0846   CHOL 151 03/22/2017 1549   TRIG 141 03/01/2018 0846   HDL 38 (L) 03/01/2018 0846   HDL 47 03/22/2017 1549   CHOLHDL 3.3 03/01/2018 0846   VLDL 21 06/29/2017 0807   LDLCALC 66 03/01/2018 0846     Assessment & Plan:   1. DM type 2 causing vascular disease (HCC) - Patient has currently uncontrolled symptomatic type 2 DM since  53 years of age.  He came with loss of control of diabetes with A1c of 9.4% increasing progressively from 6.8% .   Unfortunately, this is expected when he refused insulin treatment offered last visit. -He has regained 30 pounds after he lost 30 pounds.      Recent labs reviewed with him showing normal renal function.  Is found to have vitamin D deficiency.  -his diabetes is complicated by coronary artery disease with stent placement on 03/15/2017 and Joel Ward remains at extremely  high risk for more acute and chronic complications which include CAD, CVA, CKD, retinopathy, and neuropathy. These are all discussed in detail with the patient.  - I have counseled him on diet management and weight loss, by adopting a carbohydrate restricted/protein rich diet. -He did admits to dietary indiscretions including consumption of sweets and sweetened beverages.    - Patient admits there is a room for improvement in his diet and drink choices. -  Suggestion is made for him to avoid simple carbohydrates  from his diet including Cakes, Sweet Desserts / Pastries, Ice Cream, Soda (diet and regular), Sweet Tea, Candies, Chips, Cookies, Store Bought Juices, Alcohol in Excess of  1-2 drinks a day, Artificial Sweeteners, and "Sugar-free" Products. This will help patient to have stable blood glucose profile and potentially avoid unintended weight gain.  - I encouraged him to switch to  unprocessed or minimally processed complex starch and increased protein intake (animal or plant source), fruits, and vegetables.  - he is advised to stick to a routine mealtimes to eat 3 meals  a day and avoid unnecessary snacks ( to snack only to correct hypoglycemia).   - he will be scheduled with Norm Salt, RDN, CDE for individualized DM education- consult pending.  - I have approached him with the following individualized plan to manage diabetes and patient agrees:   -Due to the fact that he is losing control of his diabetes very fast with A1c of 9.4% and is extremely high risk for coronary disease, he is reapproached for insulin  treatment, he reluctantly accepts today.    -  I demonstrated insulin use in the exam room for him.  I gave him a sample and prescribed Tresiba to start 20 units subcutaneously nightly, associated with strict monitoring of blood glucose 4 times a day-before meals and at bedtime and return in 10 days for reevaluation with his meter and logs.    -He is advised to continue Janumet 50/1000 mg ER once a day after breakfast.   - Patient specific target  A1c;  LDL, HDL, Triglycerides, and  Waist Circumference were discussed in detail.  2) BP/HTN: His blood pressure is controlled to target.   He is advised to continue his current blood pressure medications including lisinopril 10 mg p.o. daily at breakfast.    3) Lipids/HPL: His recent lipid panel showed controlled LDL at 54.  He is advised to continue atorvastatin 40 mg p.o. nightly.     4)  Weight/Diet: Obese with a BMI of 40-clearly complicating his diabetes care.   I discussed with him the fact that he would benefit the most from loss of 5 - 10% of his current body weight .  To achieve his he has to stick to the dietary and exercise regimen provided to him and will benefit from bariatric surgery.     CDE Consult has been initiated , exercise, and detailed carbohydrates information provided.  He is given brochure on bariatric surgery.  5) vitamin D deficiency: Discussed and initiated vitamin D supplement with ergocalciferol 50,000 units weekly for the next 4 weeks.  6) Chronic Care/Health Maintenance:  -he  is on ACEI/ARB and Statin medications and  is encouraged to continue to follow up with Ophthalmology, Dentist,  Podiatrist at least yearly or according to recommendations, and advised to  stay away from smoking. I have recommended yearly flu vaccine and pneumonia vaccination at least every 5 years; moderate intensity exercise for up to 150 minutes weekly; and  sleep for at least 7 hours a day.  - I advised patient to maintain close follow up  with Elfredia NevinsFusco, Lawrence, MD for primary care needs.  - Time spent with the patient: 25 min, of which >50% was spent in reviewing his  current and  previous labs/studies, previous treatments, and medications doses and developing a plan for long-term care based on the latest recommendations for standards of care. [employer]erick J Pendelton participated in the discussions, expressed understanding, and voiced agreement with the above plans.  All questions were answered to his satisfaction. he is encouraged to contact clinic should he have any questions or concerns prior to his return visit.   Follow up plan: - Return in about 10 days (around 07/11/2018) for Follow up with Meter and Logs Only - no Labs.  Marquis LunchGebre Dior Stepter, MD Phone: [phone]  Fax: [phone]   07/01/2018, 10:07 AM This note was partially dictated with voice recognition software. Similar sounding words can be transcribed inadequately or may not  be corrected upon review.

## 2018-07-13 ENCOUNTER — Ambulatory Visit (INDEPENDENT_AMBULATORY_CARE_PROVIDER_SITE_OTHER): Payer: BLUE CROSS/BLUE SHIELD | Admitting: "Endocrinology

## 2018-07-13 ENCOUNTER — Ambulatory Visit: Payer: BLUE CROSS/BLUE SHIELD | Admitting: "Endocrinology

## 2018-07-13 ENCOUNTER — Encounter: Payer: Self-pay | Admitting: "Endocrinology

## 2018-07-13 VITALS — BP 132/73 | HR 74 | Ht 71.0 in | Wt 292.0 lb

## 2018-07-13 DIAGNOSIS — E1159 Type 2 diabetes mellitus with other circulatory complications: Secondary | ICD-10-CM

## 2018-07-13 DIAGNOSIS — I10 Essential (primary) hypertension: Secondary | ICD-10-CM

## 2018-07-13 DIAGNOSIS — Z6836 Body mass index (BMI) 36.0-36.9, adult: Secondary | ICD-10-CM

## 2018-07-13 DIAGNOSIS — E782 Mixed hyperlipidemia: Secondary | ICD-10-CM

## 2018-07-13 NOTE — Patient Instructions (Signed)

## 2018-07-13 NOTE — Progress Notes (Signed)
Endocrinology follow-up note  Subjective:    [name], male    DOB: 12-14-1965.  he is being seen in follow up  for management of currently uncontrolled symptomatic uncontrolled type 2 diabetes, hyperlipidemia, hypertension.  PMD:   Joel Nevins, MD.   Past Medical History:  Diagnosis Date  . CAD (coronary artery disease), native coronary artery    10/18 PCI/DES mLAD, normal EF on LV gram  . Diabetes mellitus    Type 2 diagnosed 2 weeks ago  . Hiatal hernia with gastroesophageal reflux   . Hypertension   . Mixed hyperlipidemia   . Morbid obesity (HCC)   . OSA on CPAP   . Urinary frequency    Past Surgical History:  Procedure Laterality Date  . CIRCUMCISION     1988  . CORONARY STENT INTERVENTION N/A 03/16/2017   Procedure: CORONARY STENT INTERVENTION;  Surgeon: Lyn Records, MD;  Location: [facility] INVASIVE CV LAB;  Service: Cardiovascular;  Laterality: N/A;  . ESOPHAGEAL DILATION N/A 02/08/2015   Procedure: ESOPHAGEAL DILATION;  Surgeon: Malissa Hippo, MD;  Location: [facility] ENDO SUITE;  Service: Endoscopy;  Laterality: N/A;  . ESOPHAGOGASTRODUODENOSCOPY N/A 02/08/2015   Procedure: ESOPHAGOGASTRODUODENOSCOPY (EGD);  Surgeon: Malissa Hippo, MD;  Location: [facility] ENDO SUITE;  Service: Endoscopy;  Laterality: N/A;  1055  . ESOPHAGOGASTRODUODENOSCOPY (EGD) WITH ESOPHAGEAL DILATION  03/29/2012   Procedure: ESOPHAGOGASTRODUODENOSCOPY (EGD) WITH ESOPHAGEAL DILATION;  Surgeon: Malissa Hippo, MD;  Location: [facility] ENDO SUITE;  Service: Endoscopy;  Laterality: N/A;  730  . LEFT HEART CATH AND CORONARY ANGIOGRAPHY N/A 03/16/2017   Procedure: LEFT HEART CATH AND CORONARY ANGIOGRAPHY;  Surgeon: Lyn Records, MD;  Location: [facility] INVASIVE CV LAB;  Service: Cardiovascular;  Laterality: N/A;  . ULTRASOUND GUIDANCE FOR VASCULAR ACCESS  03/16/2017   Procedure: Ultrasound Guidance For Vascular Access;  Surgeon: Lyn Records, MD;  Location: [facility] INVASIVE CV LAB;  Service:  Cardiovascular;;   Social History   Socioeconomic History  . Marital status: Married    Spouse name: Not on file  . Number of children: Not on file  . Years of education: Not on file  . Highest education level: Not on file  Occupational History  . Not on file  Social Needs  . Financial resource strain: Not on file  . Food insecurity:    Worry: Not on file    Inability: Not on file  . Transportation needs:    Medical: Not on file    Non-medical: Not on file  Tobacco Use  . Smoking status: Never Smoker  . Smokeless tobacco: Never Used  Substance and Sexual Activity  . Alcohol use: No  . Drug use: No  . Sexual activity: Not on file  Lifestyle  . Physical activity:    Days per week: Not on file    Minutes per session: Not on file  . Stress: Not on file  Relationships  . Social connections:    Talks on phone: Not on file    Gets together: Not on file    Attends religious service: Not on file    Active member of club or organization: Not on file    Attends meetings of clubs or organizations: Not on file    Relationship status: Not on file  Other Topics Concern  . Not on file  Social History Narrative   Drinks caffeine 4 times a week.      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   Outpatient Encounter  Medications as of 07/13/2018  Medication Sig  . aspirin 81 MG chewable tablet Chew 1 tablet (81 mg total) by mouth daily.  [name]. atorvastatin (LIPITOR) 40 MG tablet TAKE 1 TABLET BY MOUTH ONCE DAILY AT  6  PM  . Insulin Glargine, 1 Unit Dial, 300 UNIT/ML SOPN Inject 20 Units into the skin at bedtime.  . Insulin Pen Needle (B-D ULTRAFINE III SHORT PEN) 31G X 8 MM MISC 1 each by Does not apply route as directed.  [name]. lisinopril (PRINIVIL,ZESTRIL) 10 MG tablet Take 1 tablet (10 mg total) by mouth daily.  . Melatonin 2.5 MG CAPS Take 2.5 mg by mouth at bedtime as needed (sleep).  . nitroGLYCERIN (NITROSTAT) 0.4 MG SL tablet Place 1 tablet (0.4 mg total) under the tongue every 5 (five) minutes as  needed. (Patient not taking: Reported on 08/05/2017)  . sildenafil (VIAGRA) 100 MG tablet Take 100 mg by mouth as needed.  . SitaGLIPtin-MetFORMIN HCl (JANUMET XR) 50-1000 MG TB24 TAKE 1 TABLET BY MOUTH ONCE AFTER BREAKFAST  . Vitamin D, Ergocalciferol, (DRISDOL) 50000 units CAPS capsule Take 1 capsule (50,000 Units total) by mouth every 7 (seven) days. (Patient not taking: Reported on 07/01/2018)  . [DISCONTINUED] insulin degludec (TRESIBA FLEXTOUCH) 100 UNIT/ML SOPN FlexTouch Pen Inject 0.2 mLs (20 Units total) into the skin daily.   No facility-administered encounter medications on file as of 07/13/2018.     ALLERGIES: Allergies  Allergen Reactions  . Shellfish Allergy Swelling    Turns red  . Strawberry Extract Rash    VACCINATION STATUS:  There is no immunization history on file for this patient.  Diabetes  He presents for his follow-up diabetic visit. He has type 2 diabetes mellitus. Onset time: He reports that he was diagnosed at approximate age of 53 years. His disease course has been worsening. There are no hypoglycemic associated symptoms. Pertinent negatives for hypoglycemia include no confusion, headaches, pallor, seizures or tremors. Pertinent negatives for diabetes include no blurred vision, no chest pain, no fatigue, no polydipsia, no polyphagia, no polyuria and no weakness. There are no hypoglycemic complications. Symptoms are improving. Diabetic complications include heart disease. Risk factors for coronary artery disease include dyslipidemia, diabetes mellitus, family history, hypertension, male sex, obesity and sedentary lifestyle. Current diabetic treatment includes oral agent (dual therapy) (She is taking Janumet 50/1000 mg once a day, metformin 1000 mg once a day.). His weight is fluctuating minimally. He is following a diabetic diet. When asked about meal planning, he reported none. He has not had a previous visit with a dietitian. He participates in exercise intermittently.  His breakfast blood glucose range is generally 140-180 mg/dl. His lunch blood glucose range is generally 140-180 mg/dl. His dinner blood glucose range is generally 140-180 mg/dl. His bedtime blood glucose range is generally 140-180 mg/dl. His overall blood glucose range is 140-180 mg/dl. (He returns with improved glycemic profile after he was initiated on basal insulin based on his recent A1c of 9.4%. ) An ACE inhibitor/angiotensin II receptor blocker is being taken. He does not see a podiatrist.Eye exam is not current.  Hyperlipidemia  This is a chronic problem. The current episode started more than 1 year ago. The problem is controlled. Exacerbating diseases include diabetes and obesity. Pertinent negatives include no chest pain, myalgias or shortness of breath. Current antihyperlipidemic treatment includes statins. Risk factors for coronary artery disease include dyslipidemia, diabetes mellitus, hypertension, male sex, obesity, a sedentary lifestyle and family history.  Hypertension  This is a chronic problem. The  problem is controlled. Pertinent negatives include no blurred vision, chest pain, headaches, neck pain, palpitations or shortness of breath. Risk factors for coronary artery disease include diabetes mellitus, dyslipidemia, obesity, male gender, sedentary lifestyle and family history. Past treatments include ACE inhibitors. Hypertensive end-organ damage includes CAD/MI.     Review of Systems  Constitutional: Negative for chills, fatigue, fever and unexpected weight change.  HENT: Negative for dental problem, mouth sores and trouble swallowing.   Eyes: Negative for blurred vision and visual disturbance.  Respiratory: Negative for cough, choking, chest tightness, shortness of breath and wheezing.   Cardiovascular: Negative for chest pain, palpitations and leg swelling.       No Shortness of breath  Gastrointestinal: Negative for abdominal distention, abdominal pain, constipation, diarrhea,  nausea and vomiting.  Endocrine: Negative for polydipsia, polyphagia and polyuria.  Genitourinary: Negative for dysuria, flank pain, frequency, hematuria and urgency.  Musculoskeletal: Negative for back pain, gait problem, myalgias and neck pain.  Skin: Negative for pallor, rash and wound.  Neurological: Negative for tremors, seizures, syncope, weakness, numbness and headaches.  Hematological: Does not bruise/bleed easily.  Psychiatric/Behavioral: Negative for confusion, dysphoric mood, hallucinations and suicidal ideas.    Objective:    BP 132/73   Pulse 74   Ht 5\' 11"  (1.803 m)   Wt 292 lb (132.5 kg)   BMI 40.73 kg/m   Wt Readings from Last 3 Encounters:  07/13/18 292 lb (132.5 kg)  07/01/18 296 lb (134.3 kg)  03/17/18 292 lb (132.5 kg)     Physical Exam  Constitutional: He is oriented to person, place, and time. He appears well-developed. He is cooperative. No distress.  HENT:  Head: Normocephalic and atraumatic.  Eyes: EOM are normal.  Neck: Normal range of motion. Neck supple. No tracheal deviation present. No thyromegaly present.  Cardiovascular: Normal rate, S1 normal, S2 normal and normal heart sounds. Exam reveals no gallop.  No murmur heard. Pulses:      Dorsalis pedis pulses are 1+ on the right side and 1+ on the left side.       Posterior tibial pulses are 1+ on the right side and 1+ on the left side.  Pulmonary/Chest: Effort normal. No respiratory distress. He has no wheezes.  Abdominal: He exhibits no distension. There is no abdominal tenderness. There is no guarding and no CVA tenderness.  Musculoskeletal:        General: No edema.     Right shoulder: He exhibits no swelling and no deformity.  Neurological: He is alert and oriented to person, place, and time. He has normal strength. No cranial nerve deficit or sensory deficit. Gait normal.  Skin: Skin is warm and dry. No rash noted. No cyanosis. Nails show no clubbing.  Psychiatric: He has a normal mood and  affect. His speech is normal. Thought content normal. Cognition and memory are normal.    CMP ( most recent) CMP     Component Value Date/Time   NA 137 06/29/2018 0920   NA 140 03/22/2017 1549   K 4.2 06/29/2018 0920   CL 101 06/29/2018 0920   CO2 27 06/29/2018 0920   GLUCOSE 266 (H) 06/29/2018 0920   BUN 14 06/29/2018 0920   BUN 14 03/22/2017 1549   CREATININE 0.97 06/29/2018 0920   CALCIUM 9.7 06/29/2018 0920   PROT 7.4 06/29/2018 0920   PROT 8.3 03/22/2017 1549   ALBUMIN 4.2 06/29/2017 0807   ALBUMIN 4.6 03/22/2017 1549   AST 17 06/29/2018 0920   ALT 28  06/29/2018 0920   ALKPHOS 60 06/29/2017 0807   BILITOT 0.5 06/29/2018 0920   BILITOT 0.4 03/22/2017 1549   GFRNONAA 91 06/29/2018 0920   GFRAA 105 06/29/2018 0920     Diabetic Labs (most recent): Lab Results  Component Value Date   HGBA1C 9.4 (H) 06/29/2018   HGBA1C 8.4 (H) 03/01/2018   HGBA1C 6.8 (H) 12/20/2017     Lipid Panel ( most recent) Lipid Panel     Component Value Date/Time   CHOL 126 03/01/2018 0846   CHOL 151 03/22/2017 1549   TRIG 141 03/01/2018 0846   HDL 38 (L) 03/01/2018 0846   HDL 47 03/22/2017 1549   CHOLHDL 3.3 03/01/2018 0846   VLDL 21 06/29/2017 0807   LDLCALC 66 03/01/2018 0846     Assessment & Plan:   1. DM type 2 causing vascular disease (HCC) - Patient has currently uncontrolled symptomatic type 2 DM since  53 years of age.  He recently came with loss of control of diabetes with A1c of 9.4% increasing progressively from 6.8% .  That necessitated initiation of basal insulin which helped him control glycemia to target both fasting and postprandial.     Recent labs reviewed with him showing normal renal function.  Is found to have vitamin D deficiency.  -his diabetes is complicated by coronary artery disease with stent placement on 03/15/2017 and Joel Ward remains at extremely  high risk for more acute and chronic complications which include CAD, CVA, CKD, retinopathy, and  neuropathy. These are all discussed in detail with the patient.  - I have counseled him on diet management and weight loss, by adopting a carbohydrate restricted/protein rich diet. -He did admits to dietary indiscretions including consumption of sweets and sweetened beverages.   - Patient admits there is a room for improvement in his diet and drink choices. -  Suggestion is made for him to avoid simple carbohydrates  from his diet including Cakes, Sweet Desserts / Pastries, Ice Cream, Soda (diet and regular), Sweet Tea, Candies, Chips, Cookies, Store Bought Juices, Alcohol in Excess of  1-2 drinks a day, Artificial Sweeteners, and "Sugar-free" Products. This will help patient to have stable blood glucose profile and potentially avoid unintended weight gain.   - I encouraged him to switch to  unprocessed or minimally processed complex starch and increased protein intake (animal or plant source), fruits, and vegetables.  - he is advised to stick to a routine mealtimes to eat 3 meals  a day and avoid unnecessary snacks ( to snack only to correct hypoglycemia).   - he will be scheduled with Norm SaltPenny Crumpton, RDN, CDE for individualized DM education- consult pending.  - I have approached him with the following individualized plan to manage diabetes and patient agrees:   -Based on his presentation with near target glycemic profile both fasting and postprandial, he will not require prandial insulin for now.   -He is advised to continue Tresiba 20 units subcutaneously nightly, associated with strict monitoring of blood glucose 2 times a day-daily before breakfast and at bedtime.   -He is advised to continue Janumet 50/1000 mg ER once a day after breakfast. -He is encouraged to call clinic for hypoglycemia below 70 and hyperglycemia above 300 mg/dL.  - Patient specific target  A1c;  LDL, HDL, Triglycerides, and  Waist Circumference were discussed in detail.  2) BP/HTN: His blood pressure is controlled to  target.   He is advised to continue his current blood pressure medications including lisinopril  10 mg p.o. daily at breakfast.    3) Lipids/HPL: His recent lipid panel showed controlled LDL at 54.  He is advised to continue atorvastatin 40 mg p.o. nightly.    4)  Weight/Diet: Obese with a BMI of 40-clearly complicating his diabetes care.   I discussed with him the fact that he would benefit the most from loss of 5 - 10% of his current body weight .  To achieve his he has to stick to the dietary and exercise regimen provided to him and will benefit from bariatric surgery.     CDE Consult has been initiated , exercise, and detailed carbohydrates information provided.  He is given brochure on bariatric surgery.  5) vitamin D deficiency: Discussed and initiated vitamin D supplement with ergocalciferol 50,000 units weekly for the next 4 weeks.  6) Chronic Care/Health Maintenance:  -he  is on ACEI/ARB and Statin medications and  is encouraged to continue to follow up with Ophthalmology, Dentist,  Podiatrist at least yearly or according to recommendations, and advised to  stay away from smoking. I have recommended yearly flu vaccine and pneumonia vaccination at least every 5 years; moderate intensity exercise for up to 150 minutes weekly; and  sleep for at least 7 hours a day.  - I advised patient to maintain close follow up with Joel Nevins, MD for primary care needs. - Time spent with the patient: 25 min, of which >50% was spent in reviewing his blood glucose logs , discussing his hypoglycemia and hyperglycemia episodes, reviewing his current and  previous labs / studies and medications  doses and developing a plan to avoid hypoglycemia and hyperglycemia. Please refer to Patient Instructions for Blood Glucose Monitoring and Insulin/Medications Dosing Guide"  in media tab for additional information. [employer] participated in the discussions, expressed understanding, and voiced agreement with  the above plans.  All questions were answered to his satisfaction. he is encouraged to contact clinic should he have any questions or concerns prior to his return visit.  Follow up plan: - Return in about 3 months (around 10/11/2018) for Meter, and Logs.  Marquis Lunch, MD Phone: [phone]  Fax: [phone]   07/13/2018, 1:25 PM This note was partially dictated with voice recognition software. Similar sounding words can be transcribed inadequately or may not  be corrected upon review.

## 2018-08-24 DIAGNOSIS — G4730 Sleep apnea, unspecified: Secondary | ICD-10-CM | POA: Diagnosis not present

## 2018-08-24 DIAGNOSIS — G4733 Obstructive sleep apnea (adult) (pediatric): Secondary | ICD-10-CM | POA: Diagnosis not present

## 2018-09-12 ENCOUNTER — Other Ambulatory Visit: Payer: Self-pay | Admitting: Cardiovascular Disease

## 2018-09-24 DIAGNOSIS — G4730 Sleep apnea, unspecified: Secondary | ICD-10-CM | POA: Diagnosis not present

## 2018-09-24 DIAGNOSIS — G4733 Obstructive sleep apnea (adult) (pediatric): Secondary | ICD-10-CM | POA: Diagnosis not present

## 2018-09-30 ENCOUNTER — Telehealth: Payer: Self-pay | Admitting: Cardiovascular Disease

## 2018-09-30 DIAGNOSIS — E1159 Type 2 diabetes mellitus with other circulatory complications: Secondary | ICD-10-CM | POA: Diagnosis not present

## 2018-09-30 MED ORDER — ATORVASTATIN CALCIUM 40 MG PO TABS: ORAL_TABLET | ORAL | 6 refills | Status: DC

## 2018-09-30 NOTE — Telephone Encounter (Signed)
Spoke with pt, he needs refill on atorvastatin. Sent to [facility]

## 2018-09-30 NOTE — Telephone Encounter (Signed)
Returned pt call. No answer, left message for pt to call back.  

## 2018-09-30 NOTE — Telephone Encounter (Signed)
Please call patient in regards to dosage change on Atorvastatin. / tg

## 2018-10-01 LAB — COMPLETE METABOLIC PANEL WITH GFR
AG Ratio: 1.4 (calc) (ref 1.0–2.5)
ALBUMIN MSPROF: 4.3 g/dL (ref 3.6–5.1)
ALT: 30 U/L (ref 9–46)
AST: 16 U/L (ref 10–35)
Alkaline phosphatase (APISO): 73 U/L (ref 35–144)
BILIRUBIN TOTAL: 0.5 mg/dL (ref 0.2–1.2)
BUN: 14 mg/dL (ref 7–25)
CALCIUM: 9.2 mg/dL (ref 8.6–10.3)
CO2: 31 mmol/L (ref 20–32)
Chloride: 101 mmol/L (ref 98–110)
Creat: 1.22 mg/dL (ref 0.70–1.33)
GFR, EST AFRICAN AMERICAN: 80 mL/min/{1.73_m2} (ref >=60)
GFR, EST NON AFRICAN AMERICAN: 69 mL/min/{1.73_m2} (ref >=60)
Globulin: 3.1 g/dL (calc) (ref 1.9–3.7)
Glucose, Bld: 216 mg/dL — ABNORMAL HIGH (ref 65–99)
Potassium: 3.8 mmol/L (ref 3.5–5.3)
Sodium: 138 mmol/L (ref 135–146)
TOTAL PROTEIN: 7.4 g/dL (ref 6.1–8.1)

## 2018-10-01 LAB — HEMOGLOBIN A1C
EAG (MMOL/L): 11.6 (calc)
Hgb A1c MFr Bld: 8.9 % of total Hgb — ABNORMAL HIGH (ref <5.7)
Mean Plasma Glucose: 209 (calc)

## 2018-10-04 DIAGNOSIS — E119 Type 2 diabetes mellitus without complications: Secondary | ICD-10-CM | POA: Diagnosis not present

## 2018-10-04 DIAGNOSIS — Z794 Long term (current) use of insulin: Secondary | ICD-10-CM | POA: Diagnosis not present

## 2018-10-04 DIAGNOSIS — H524 Presbyopia: Secondary | ICD-10-CM | POA: Diagnosis not present

## 2018-10-11 ENCOUNTER — Encounter: Payer: Self-pay | Admitting: "Endocrinology

## 2018-10-11 ENCOUNTER — Ambulatory Visit: Payer: BLUE CROSS/BLUE SHIELD | Admitting: "Endocrinology

## 2018-10-11 ENCOUNTER — Other Ambulatory Visit: Payer: Self-pay

## 2018-10-11 ENCOUNTER — Ambulatory Visit (INDEPENDENT_AMBULATORY_CARE_PROVIDER_SITE_OTHER): Payer: BLUE CROSS/BLUE SHIELD | Admitting: "Endocrinology

## 2018-10-11 DIAGNOSIS — I10 Essential (primary) hypertension: Secondary | ICD-10-CM

## 2018-10-11 DIAGNOSIS — E1159 Type 2 diabetes mellitus with other circulatory complications: Secondary | ICD-10-CM | POA: Diagnosis not present

## 2018-10-11 DIAGNOSIS — E782 Mixed hyperlipidemia: Secondary | ICD-10-CM

## 2018-10-11 MED ORDER — INSULIN GLARGINE (1 UNIT DIAL) 300 UNIT/ML ~~LOC~~ SOPN: 30.0000 [IU] | PEN_INJECTOR | Freq: Every day | SUBCUTANEOUS | 2 refills | Status: DC

## 2018-10-11 NOTE — Progress Notes (Signed)
10/11/2018                                                     Endocrinology Telehealth Visit Follow up Note -During COVID -19 Pandemic  This visit type was conducted due to national recommendations for restrictions regarding the COVID-19 Pandemic  in an effort to limit this patient's exposure and mitigate transmission of the corona virus.  Due to his co-morbid illnesses, Joel Ward is at  moderate to high risk for complications without adequate follow up.  This format is felt to be most appropriate for him at this time.  I connected with this patient on 10/11/2018   by telephone and verified that I am speaking with the correct person using two identifiers. Joel Ward, [birth year]. he has verbally consented to this visit. All issues noted in this document were discussed and addressed. The format was not optimal for physical exam.    Subjective:    [name], male    DOB: 06-03-1965.  he is being engaged in telehealth in follow up  for management of currently uncontrolled symptomatic uncontrolled type 2 diabetes, hyperlipidemia, hypertension.  PMD:   Joel Nevins, MD.   Past Medical History:  Diagnosis Date  . CAD (coronary artery disease), native coronary artery    10/18 PCI/DES mLAD, normal EF on LV gram  . Diabetes mellitus    Type 2 diagnosed 2 weeks ago  . Hiatal hernia with gastroesophageal reflux   . Hypertension   . Mixed hyperlipidemia   . Morbid obesity (HCC)   . OSA on CPAP   . Urinary frequency    Past Surgical History:  Procedure Laterality Date  . CIRCUMCISION     1988  . CORONARY STENT INTERVENTION N/A 03/16/2017   Procedure: CORONARY STENT INTERVENTION;  Surgeon: Lyn Records, MD;  Location: [facility] INVASIVE CV LAB;  Service: Cardiovascular;  Laterality: N/A;  . ESOPHAGEAL DILATION N/A 02/08/2015   Procedure: ESOPHAGEAL DILATION;  Surgeon: Malissa Hippo, MD;  Location: [facility] ENDO SUITE;  Service: Endoscopy;  Laterality: N/A;  .  ESOPHAGOGASTRODUODENOSCOPY N/A 02/08/2015   Procedure: ESOPHAGOGASTRODUODENOSCOPY (EGD);  Surgeon: Malissa Hippo, MD;  Location: [facility] ENDO SUITE;  Service: Endoscopy;  Laterality: N/A;  1055  . ESOPHAGOGASTRODUODENOSCOPY (EGD) WITH ESOPHAGEAL DILATION  03/29/2012   Procedure: ESOPHAGOGASTRODUODENOSCOPY (EGD) WITH ESOPHAGEAL DILATION;  Surgeon: Malissa Hippo, MD;  Location: [facility] ENDO SUITE;  Service: Endoscopy;  Laterality: N/A;  730  . LEFT HEART CATH AND CORONARY ANGIOGRAPHY N/A 03/16/2017   Procedure: LEFT HEART CATH AND CORONARY ANGIOGRAPHY;  Surgeon: Lyn Records, MD;  Location: [facility] INVASIVE CV LAB;  Service: Cardiovascular;  Laterality: N/A;  . ULTRASOUND GUIDANCE FOR VASCULAR ACCESS  03/16/2017   Procedure: Ultrasound Guidance For Vascular Access;  Surgeon: Lyn Records, MD;  Location: [facility] INVASIVE CV LAB;  Service: Cardiovascular;;   Social History   Socioeconomic History  . Marital status: Married    Spouse name: Not on file  . Number of children: Not on file  . Years of education: Not on file  . Highest education level: Not on file  Occupational History  . Not on file  Social Needs  . Financial resource strain: Not on file  . Food insecurity:    Worry: Not on file    Inability: Not  on file  . Transportation needs:    Medical: Not on file    Non-medical: Not on file  Tobacco Use  . Smoking status: Never Smoker  . Smokeless tobacco: Never Used  Substance and Sexual Activity  . Alcohol use: No  . Drug use: No  . Sexual activity: Not on file  Lifestyle  . Physical activity:    Days per week: Not on file    Minutes per session: Not on file  . Stress: Not on file  Relationships  . Social connections:    Talks on phone: Not on file    Gets together: Not on file    Attends religious service: Not on file    Active member of club or organization: Not on file    Attends meetings of clubs or organizations: Not on file    Relationship status: Not on file  Other Topics  Concern  . Not on file  Social History Narrative   Drinks caffeine 4 times a week.      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   Outpatient Encounter Medications as of 10/11/2018  Medication Sig  . aspirin 81 MG chewable tablet Chew 1 tablet (81 mg total) by mouth daily.  [name]. atorvastatin (LIPITOR) 40 MG tablet TAKE 1 TABLET BY MOUTH ONCE DAILY AT  6  PM  . Insulin Glargine, 1 Unit Dial, 300 UNIT/ML SOPN Inject 30 Units into the skin at bedtime.  . Insulin Pen Needle (B-D ULTRAFINE III SHORT PEN) 31G X 8 MM MISC 1 each by Does not apply route as directed.  [name]. lisinopril (PRINIVIL,ZESTRIL) 10 MG tablet Take 1 tablet (10 mg total) by mouth daily.  . Melatonin 2.5 MG CAPS Take 2.5 mg by mouth at bedtime as needed (sleep).  . nitroGLYCERIN (NITROSTAT) 0.4 MG SL tablet Place 1 tablet (0.4 mg total) under the tongue every 5 (five) minutes as needed. (Patient not taking: Reported on 08/05/2017)  . sildenafil (VIAGRA) 100 MG tablet Take 100 mg by mouth as needed.  . SitaGLIPtin-MetFORMIN HCl (JANUMET XR) 50-1000 MG TB24 TAKE 1 TABLET BY MOUTH ONCE AFTER BREAKFAST  . Vitamin D, Ergocalciferol, (DRISDOL) 50000 units CAPS capsule Take 1 capsule (50,000 Units total) by mouth every 7 (seven) days. (Patient not taking: Reported on 07/01/2018)  . [DISCONTINUED] Insulin Glargine, 1 Unit Dial, 300 UNIT/ML SOPN Inject 20 Units into the skin at bedtime.   No facility-administered encounter medications on file as of 10/11/2018.     ALLERGIES: Allergies  Allergen Reactions  . Shellfish Allergy Swelling    Turns red  . Strawberry Extract Rash    VACCINATION STATUS:  There is no immunization history on file for this patient.  Diabetes  He presents for his follow-up diabetic visit. He has type 2 diabetes mellitus. Onset time: He reports that he was diagnosed at approximate age of 53 years. His disease course has been improving. There are no hypoglycemic associated symptoms. Pertinent negatives for hypoglycemia include no  confusion, headaches, pallor, seizures or tremors. Pertinent negatives for diabetes include no blurred vision, no chest pain, no fatigue, no polydipsia, no polyphagia, no polyuria and no weakness. There are no hypoglycemic complications. Symptoms are improving. Diabetic complications include heart disease. Risk factors for coronary artery disease include dyslipidemia, diabetes mellitus, family history, hypertension, male sex, obesity and sedentary lifestyle. Current diabetic treatment includes oral agent (dual therapy) (She is taking Janumet 50/1000 mg once a day, metformin 1000 mg once a day.). His weight is fluctuating minimally. He is following  a diabetic diet. When asked about meal planning, he reported none. He has not had a previous visit with a dietitian. He participates in exercise intermittently. His breakfast blood glucose range is generally 140-180 mg/dl. His bedtime blood glucose range is generally 180-200 mg/dl. His overall blood glucose range is 180-200 mg/dl. (He returns with improved glycemic profile after he was initiated on basal insulin based on his recent A1c of 9.4%. ) An ACE inhibitor/angiotensin II receptor blocker is being taken. He does not see a podiatrist.Eye exam is not current.  Hyperlipidemia  This is a chronic problem. The current episode started more than 1 year ago. The problem is controlled. Exacerbating diseases include diabetes and obesity. Pertinent negatives include no chest pain, myalgias or shortness of breath. Current antihyperlipidemic treatment includes statins. Risk factors for coronary artery disease include dyslipidemia, diabetes mellitus, hypertension, male sex, obesity, a sedentary lifestyle and family history.  Hypertension  This is a chronic problem. The problem is controlled. Pertinent negatives include no blurred vision, chest pain, headaches, neck pain, palpitations or shortness of breath. Risk factors for coronary artery disease include diabetes mellitus,  dyslipidemia, obesity, male gender, sedentary lifestyle and family history. Past treatments include ACE inhibitors. Hypertensive end-organ damage includes CAD/MI.   ROS: Limited as above   Objective:    There were no vitals taken for this visit.  Wt Readings from Last 3 Encounters:  07/13/18 292 lb (132.5 kg)  07/01/18 296 lb (134.3 kg)  03/17/18 292 lb (132.5 kg)     CMP ( most recent) CMP     Component Value Date/Time   NA 138 09/30/2018 0804   NA 140 03/22/2017 1549   K 3.8 09/30/2018 0804   CL 101 09/30/2018 0804   CO2 31 09/30/2018 0804   GLUCOSE 216 (H) 09/30/2018 0804   BUN 14 09/30/2018 0804   BUN 14 03/22/2017 1549   CREATININE 1.22 09/30/2018 0804   CALCIUM 9.2 09/30/2018 0804   PROT 7.4 09/30/2018 0804   PROT 8.3 03/22/2017 1549   ALBUMIN 4.2 06/29/2017 0807   ALBUMIN 4.6 03/22/2017 1549   AST 16 09/30/2018 0804   ALT 30 09/30/2018 0804   ALKPHOS 60 06/29/2017 0807   BILITOT 0.5 09/30/2018 0804   BILITOT 0.4 03/22/2017 1549   GFRNONAA 69 09/30/2018 0804   GFRAA 80 09/30/2018 0804     Diabetic Labs (most recent): Lab Results  Component Value Date   HGBA1C 8.9 (H) 09/30/2018   HGBA1C 9.4 (H) 06/29/2018   HGBA1C 8.4 (H) 03/01/2018     Lipid Panel ( most recent) Lipid Panel     Component Value Date/Time   CHOL 126 03/01/2018 0846   CHOL 151 03/22/2017 1549   TRIG 141 03/01/2018 0846   HDL 38 (L) 03/01/2018 0846   HDL 47 03/22/2017 1549   CHOLHDL 3.3 03/01/2018 0846   VLDL 21 06/29/2017 0807   LDLCALC 66 03/01/2018 0846     Assessment & Plan:   1. DM type 2 causing vascular disease (HCC) - Patient has currently uncontrolled symptomatic type 2 DM since  53 years of age.  His pre visit labs show slight improvement in his a1c to 8.9% from 9.4% .     Recent labs reviewed with him showing normal renal function.  Is found to have vitamin D deficiency.  -his diabetes is complicated by coronary artery disease with stent placement on 03/15/2017  and Joel Ward remains at extremely  high risk for more acute and chronic complications which  include CAD, CVA, CKD, retinopathy, and neuropathy. These are all discussed in detail with the patient.  - I have counseled him on diet management and weight loss, by adopting a carbohydrate restricted/protein rich diet. -He did admits to dietary indiscretions including consumption of sweets and sweetened beverages.    - Patient admits there is a room for improvement in his diet and drink choices. -  Suggestion is made for him to avoid simple carbohydrates  from his diet including Cakes, Sweet Desserts / Pastries, Ice Cream, Soda (diet and regular), Sweet Tea, Candies, Chips, Cookies, Store Bought Juices, Alcohol in Excess of  1-2 drinks a day, Artificial Sweeteners, and "Sugar-free" Products. This will help patient to have stable blood glucose profile and potentially avoid unintended weight gain.  - I encouraged him to switch to  unprocessed or minimally processed complex starch and increased protein intake (animal or plant source), fruits, and vegetables.  - he is advised to stick to a routine mealtimes to eat 3 meals  a day and avoid unnecessary snacks ( to snack only to correct hypoglycemia).   - he has been  scheduled with Norm Salt, RDN, CDE for individualized DM education- consult pending.  - I have approached him with the following individualized plan to manage diabetes and patient agrees:   -Based on his  Reported BG profile 130-225 at fasting, he will tolerate higher dose of basal insulin. -He is advised to increase  Tresiba to 30 units subcutaneously nightly, associated with strict monitoring of blood glucose 2 times a day-daily before breakfast and at bedtime.   -He is advised to continue Janumet 50/1000 mg ER once a day after breakfast. -He is encouraged to call clinic for hypoglycemia below 70 and hyperglycemia above 300 mg/dL.  2) BP/HTN: he is advised to home monitor blood  pressure and report if > 140/90 on 2 separate readings.  He is advised to continue his current blood pressure medications including lisinopril 10 mg p.o. daily at breakfast.    3) Lipids/HPL: His recent lipid panel showed controlled LDL at 54.  He is advised to continue atorvastatin 40 mg p.o. nightly.    4)  Weight/Diet: Obese with a BMI of 40-clearly complicating his diabetes care.   I discussed with him the fact that he would benefit the most from loss of 5 - 10% of his current body weight .  To achieve his he has to stick to the dietary and exercise regimen provided to him and will benefit from bariatric surgery.     CDE Consult has been initiated , exercise, and detailed carbohydrates information provided.  He is given brochure on bariatric surgery.  5) vitamin D deficiency: He is on ongoing rx with  vitamin D supplement with ergocalciferol 50,000 units weekly .  6) Chronic Care/Health Maintenance:  -he  is on ACEI/ARB and Statin medications and  is encouraged to continue to follow up with Ophthalmology, Dentist,  Podiatrist at least yearly or according to recommendations, and advised to  stay away from smoking. I have recommended yearly flu vaccine and pneumonia vaccination at least every 5 years; moderate intensity exercise for up to 150 minutes weekly; and  sleep for at least 7 hours a day.  - I advised patient to maintain close follow up with Joel Nevins, MD for primary care needs.  - Time spent with the patient: 25 min, of which >50% was spent in reviewing his blood glucose logs , discussing his hypoglycemia and hyperglycemia episodes, reviewing his current  and  previous labs / studies and medications  doses and developing a plan to avoid hypoglycemia and hyperglycemia. Please refer to Patient Instructions for Blood Glucose Monitoring and Insulin/Medications Dosing Guide"  in media tab for additional information. Please  also refer to " Patient Self Inventory" in the Media  tab for  reviewed elements of pertinent patient history.  Joel Ward President participated in the discussions, expressed understanding, and voiced agreement with the above plans.  All questions were answered to his satisfaction. he is encouraged to contact clinic should he have any questions or concerns prior to his return visit.   Follow up plan: - Return in about 4 months (around 02/11/2019) for Follow up with Pre-visit Labs, Meter, and Logs.  Marquis Lunch, MD Phone: [phone]  Fax: [phone]   10/11/2018, 3:39 PM This note was partially dictated with voice recognition software. Similar sounding words can be transcribed inadequately or may not  be corrected upon review.

## 2018-10-24 DIAGNOSIS — G4730 Sleep apnea, unspecified: Secondary | ICD-10-CM | POA: Diagnosis not present

## 2018-10-24 DIAGNOSIS — G4733 Obstructive sleep apnea (adult) (pediatric): Secondary | ICD-10-CM | POA: Diagnosis not present

## 2018-11-07 ENCOUNTER — Other Ambulatory Visit: Payer: Self-pay

## 2018-11-07 MED ORDER — LISINOPRIL 10 MG PO TABS: 10.0000 mg | ORAL_TABLET | Freq: Every day | ORAL | 3 refills | Status: DC

## 2018-11-07 NOTE — Telephone Encounter (Signed)
Refilled lisinopril  Mg qd

## 2018-11-24 DIAGNOSIS — G4733 Obstructive sleep apnea (adult) (pediatric): Secondary | ICD-10-CM | POA: Diagnosis not present

## 2018-11-24 DIAGNOSIS — G4730 Sleep apnea, unspecified: Secondary | ICD-10-CM | POA: Diagnosis not present

## 2018-12-04 IMAGING — DX DG RIBS W/ CHEST 3+V*L*
5 series · 5 of 5 positions shown · non-contrast
Comparison: Chest radiograph 03/11/2017

CLINICAL DATA: Left-sided lower rib pain.

EXAM:
LEFT RIBS AND CHEST - 3+ VIEW

[chest pa]
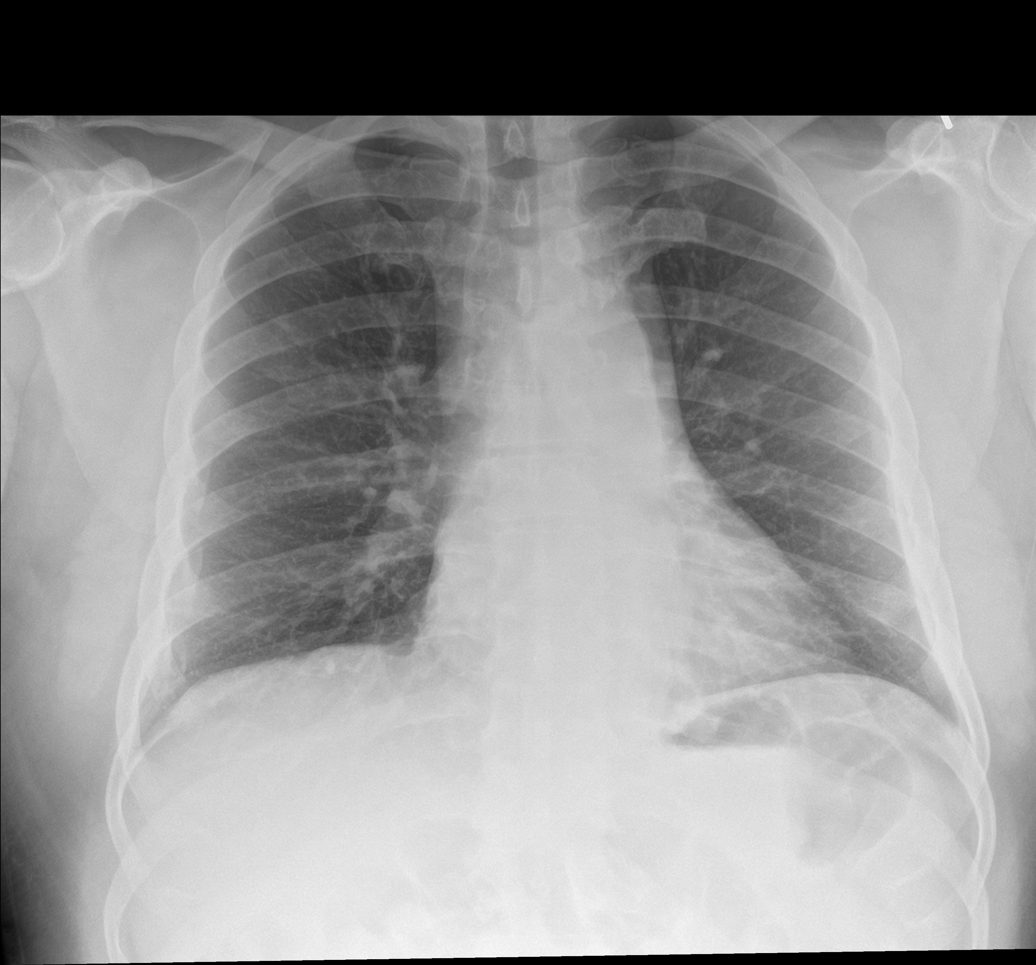

[rib pa obl (1 of 2)]
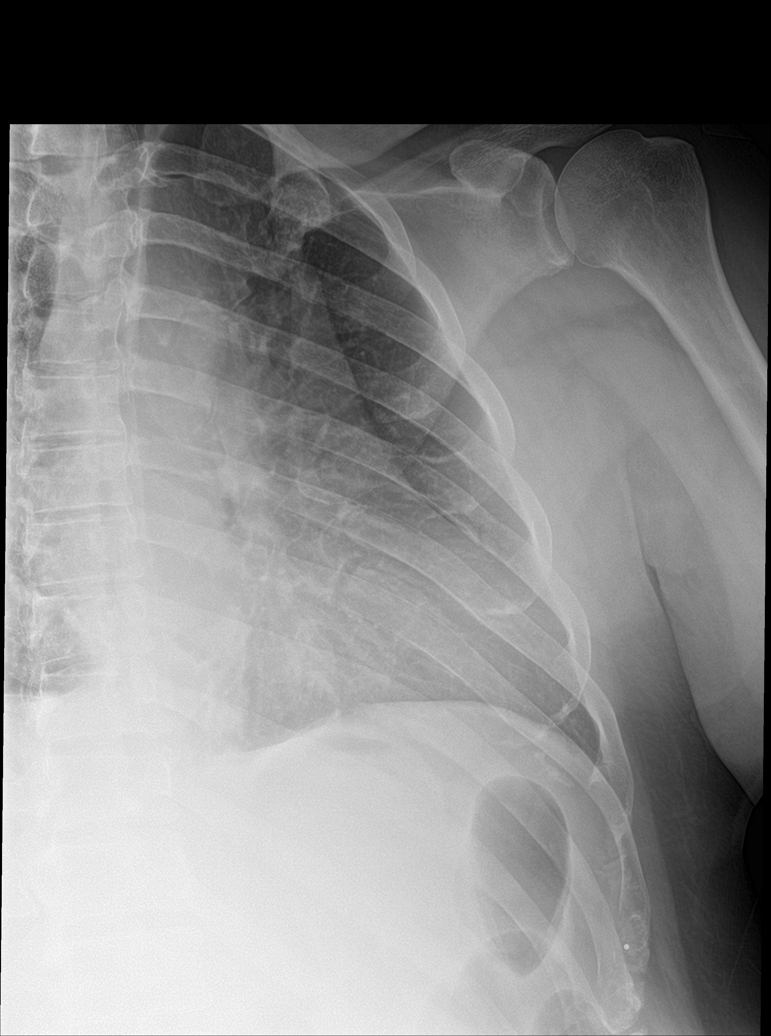

[rib pa obl (2 of 2)]
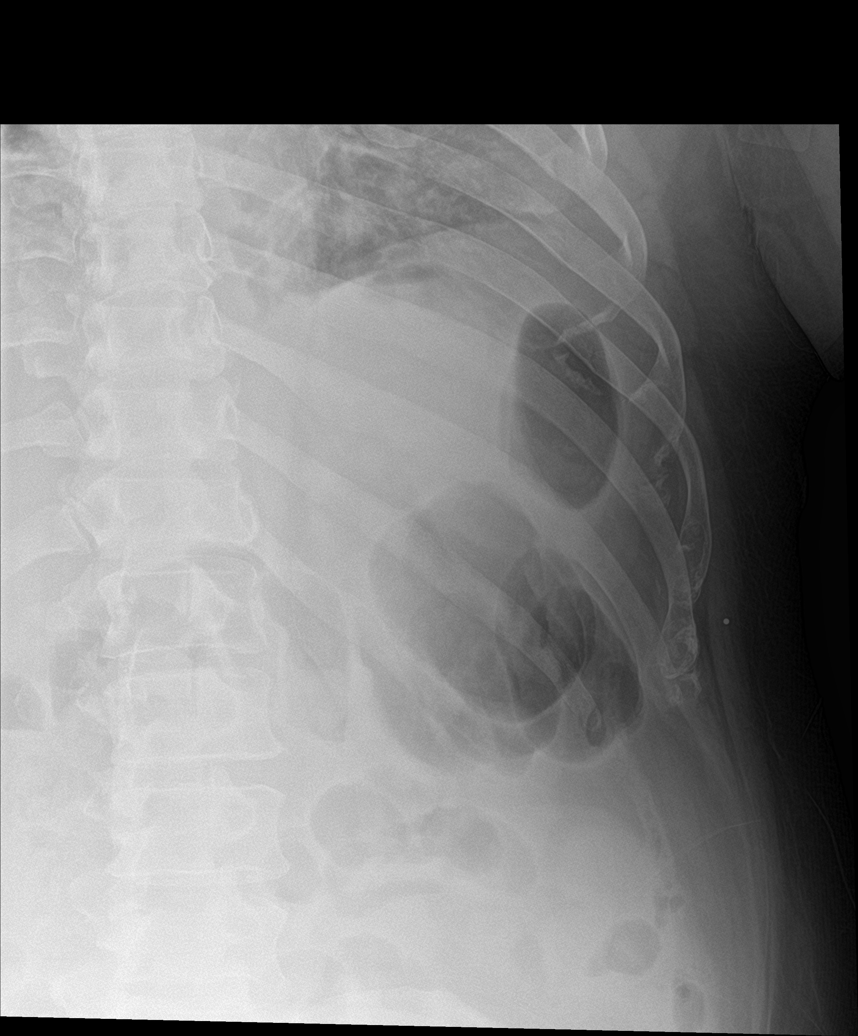

[rib pa (1 of 2)]
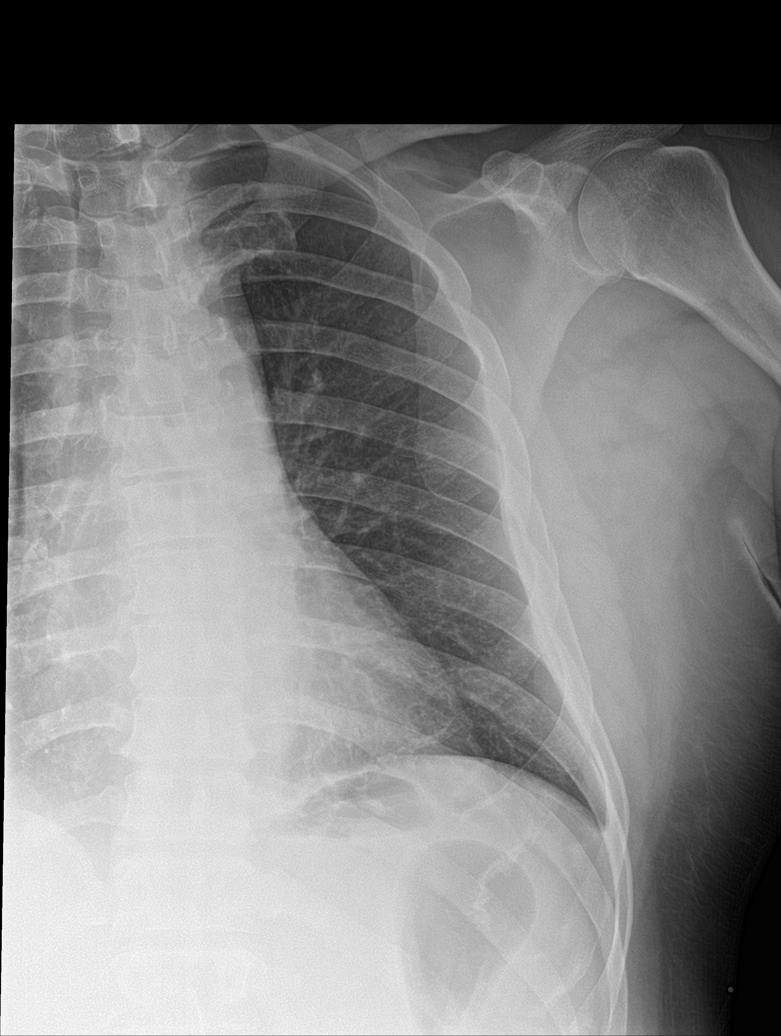

[rib pa (2 of 2)]
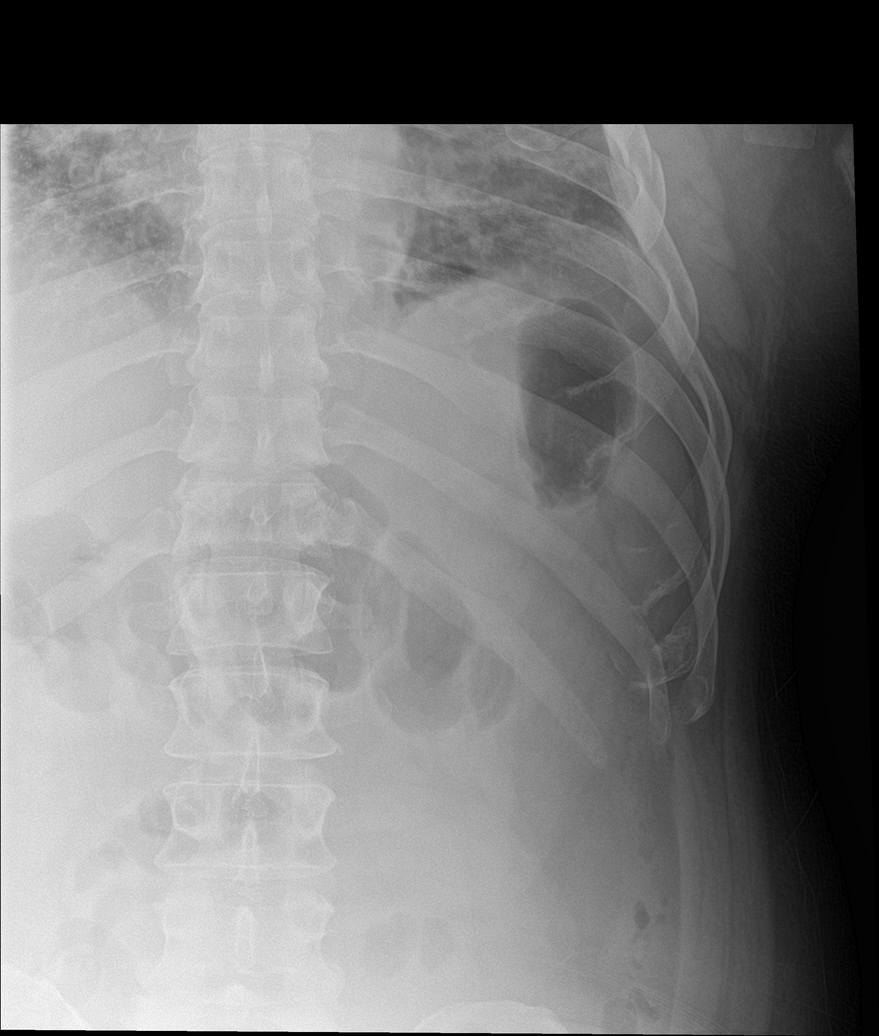

[5 of 5 positions shown; findings below may reference images not displayed]

FINDINGS: Left sixth lateral minimally displaced rib fracture.

No evidence of pneumothorax.  Possible tiny left pleural effusion.
IMPRESSION: Minimally displaced left lateral sixth rib fracture.

## 2018-12-24 DIAGNOSIS — G4733 Obstructive sleep apnea (adult) (pediatric): Secondary | ICD-10-CM | POA: Diagnosis not present

## 2018-12-24 DIAGNOSIS — G4730 Sleep apnea, unspecified: Secondary | ICD-10-CM | POA: Diagnosis not present

## 2019-01-01 ENCOUNTER — Other Ambulatory Visit: Payer: Self-pay | Admitting: "Endocrinology

## 2019-01-24 DIAGNOSIS — G4730 Sleep apnea, unspecified: Secondary | ICD-10-CM | POA: Diagnosis not present

## 2019-01-24 DIAGNOSIS — G4733 Obstructive sleep apnea (adult) (pediatric): Secondary | ICD-10-CM | POA: Diagnosis not present

## 2019-01-25 ENCOUNTER — Telehealth: Payer: Self-pay | Admitting: "Endocrinology

## 2019-01-25 NOTE — Telephone Encounter (Signed)
Patient needs refill on JANUMET XR 50-1000 MG TB24. [facility] [place]

## 2019-01-26 MED ORDER — JANUMET XR 50-1000 MG PO TB24: ORAL_TABLET | ORAL | 3 refills | Status: DC

## 2019-01-26 NOTE — Telephone Encounter (Signed)
RX sent

## 2019-01-31 DIAGNOSIS — E559 Vitamin D deficiency, unspecified: Secondary | ICD-10-CM | POA: Diagnosis not present

## 2019-01-31 DIAGNOSIS — R5383 Other fatigue: Secondary | ICD-10-CM | POA: Diagnosis not present

## 2019-01-31 DIAGNOSIS — E1159 Type 2 diabetes mellitus with other circulatory complications: Secondary | ICD-10-CM | POA: Diagnosis not present

## 2019-02-01 LAB — COMPLETE METABOLIC PANEL WITH GFR
AG Ratio: 1.4 (calc) (ref 1.0–2.5)
ALT: 24 U/L (ref 9–46)
AST: 21 U/L (ref 10–35)
Albumin: 4.1 g/dL (ref 3.6–5.1)
Alkaline phosphatase (APISO): 67 U/L (ref 35–144)
BUN: 12 mg/dL (ref 7–25)
CO2: 32 mmol/L (ref 20–32)
Calcium: 9.2 mg/dL (ref 8.6–10.3)
Chloride: 104 mmol/L (ref 98–110)
Creat: 1.06 mg/dL (ref 0.70–1.33)
GFR, Est African American: 94 mL/min/{1.73_m2} (ref >=60)
GFR, Est Non African American: 81 mL/min/{1.73_m2} (ref >=60)
Globulin: 3 g/dL (calc) (ref 1.9–3.7)
Glucose, Bld: 138 mg/dL — ABNORMAL HIGH (ref 65–99)
Potassium: 3.9 mmol/L (ref 3.5–5.3)
Sodium: 140 mmol/L (ref 135–146)
Total Bilirubin: 0.6 mg/dL (ref 0.2–1.2)
Total Protein: 7.1 g/dL (ref 6.1–8.1)

## 2019-02-01 LAB — HEMOGLOBIN A1C
Hgb A1c MFr Bld: 8.2 % of total Hgb — ABNORMAL HIGH (ref <5.7)
Mean Plasma Glucose: 189 (calc)
eAG (mmol/L): 10.4 (calc)

## 2019-02-01 LAB — T4, FREE: Free T4: 1.1 ng/dL (ref 0.8–1.8)

## 2019-02-01 LAB — TSH: TSH: 0.82 mIU/L (ref 0.40–4.50)

## 2019-02-01 LAB — VITAMIN D 25 HYDROXY (VIT D DEFICIENCY, FRACTURES): Vit D, 25-Hydroxy: 14 ng/mL — ABNORMAL LOW (ref 30–100)

## 2019-02-14 ENCOUNTER — Other Ambulatory Visit: Payer: Self-pay

## 2019-02-14 ENCOUNTER — Ambulatory Visit (INDEPENDENT_AMBULATORY_CARE_PROVIDER_SITE_OTHER): Payer: BC Managed Care – PPO | Admitting: "Endocrinology

## 2019-02-14 ENCOUNTER — Encounter: Payer: Self-pay | Admitting: "Endocrinology

## 2019-02-14 DIAGNOSIS — I10 Essential (primary) hypertension: Secondary | ICD-10-CM | POA: Diagnosis not present

## 2019-02-14 DIAGNOSIS — E559 Vitamin D deficiency, unspecified: Secondary | ICD-10-CM | POA: Diagnosis not present

## 2019-02-14 DIAGNOSIS — E1159 Type 2 diabetes mellitus with other circulatory complications: Secondary | ICD-10-CM

## 2019-02-14 DIAGNOSIS — E782 Mixed hyperlipidemia: Secondary | ICD-10-CM | POA: Diagnosis not present

## 2019-02-14 MED ORDER — VITAMIN D (ERGOCALCIFEROL) 1.25 MG (50000 UNIT) PO CAPS: 50000.0000 [IU] | ORAL_CAPSULE | ORAL | 0 refills | Status: DC

## 2019-02-14 MED ORDER — INSULIN GLARGINE (1 UNIT DIAL) 300 UNIT/ML ~~LOC~~ SOPN: 40.0000 [IU] | PEN_INJECTOR | Freq: Every day | SUBCUTANEOUS | 2 refills | Status: DC

## 2019-02-14 NOTE — Progress Notes (Signed)
02/14/2019                                                     Endocrinology Telehealth Visit Follow up Note -During COVID -19 Pandemic  This visit type was conducted due to national recommendations for restrictions regarding the COVID-19 Pandemic  in an effort to limit this patient's exposure and mitigate transmission of the corona virus.  Due to his co-morbid illnesses, Joel Ward is at  moderate to high risk for complications without adequate follow up.  This format is felt to be most appropriate for him at this time.  I connected with this patient on 02/14/2019   by telephone and verified that I am speaking with the correct person using two identifiers. Joel Ward, Jun 26, 1965. he has verbally consented to this visit. All issues noted in this document were discussed and addressed. The format was not optimal for physical exam.    Subjective:    [name], male    DOB: Jun 26, 1965.  he is being engaged in telehealth in follow up  for management of currently uncontrolled symptomatic uncontrolled type 2 diabetes, hyperlipidemia, hypertension.  PMD:   Joel Ward, Lawrence, MD.   Past Medical History:  Diagnosis Date  . CAD (coronary artery disease), native coronary artery    10/18 PCI/DES mLAD, normal EF on LV gram  . Diabetes mellitus    Type 2 diagnosed 2 weeks ago  . Hiatal hernia with gastroesophageal reflux   . Hypertension   . Mixed hyperlipidemia   . Morbid obesity (HCC)   . OSA on CPAP   . Urinary frequency    Past Surgical History:  Procedure Laterality Date  . CIRCUMCISION     1988  . CORONARY STENT INTERVENTION N/A 03/16/2017   Procedure: CORONARY STENT INTERVENTION;  Surgeon: Lyn RecordsSmith, Henry W, MD;  [facility];  Service: Cardiovascular;  Laterality: N/A;  . ESOPHAGEAL DILATION N/A 02/08/2015   Procedure: ESOPHAGEAL DILATION;  Surgeon: Malissa HippoNajeeb U [name], MD;  Location: [facility];  Service: Endoscopy;  Laterality: N/A;  .  ESOPHAGOGASTRODUODENOSCOPY N/A 02/08/2015   Procedure: ESOPHAGOGASTRODUODENOSCOPY (EGD);  Surgeon: Malissa HippoNajeeb U [name], MD;  Location: [facility];  Service: Endoscopy;  Laterality: N/A;  1055  . ESOPHAGOGASTRODUODENOSCOPY (EGD) WITH ESOPHAGEAL DILATION  03/29/2012   Procedure: ESOPHAGOGASTRODUODENOSCOPY (EGD) WITH ESOPHAGEAL DILATION;  Surgeon: Malissa HippoNajeeb U [name], MD;  Location: [facility];  Service: Endoscopy;  Laterality: N/A;  730  . LEFT HEART CATH AND CORONARY ANGIOGRAPHY N/A 03/16/2017   Procedure: LEFT HEART CATH AND CORONARY ANGIOGRAPHY;  Surgeon: Lyn RecordsSmith, Henry W, MD;  Location: [facility];  Service: Cardiovascular;  Laterality: N/A;  . ULTRASOUND GUIDANCE FOR VASCULAR ACCESS  03/16/2017   Procedure: Ultrasound Guidance For Vascular Access;  Surgeon: Lyn RecordsSmith, Henry W, MD;  [facility];  Service: Cardiovascular;;   Social History   Socioeconomic History  . Marital status: Married    Spouse name: Not on file  . Number of children: Not on file  . Years of education: Not on file  . Highest education level: Not on file  Occupational History  . Not on file  Social Needs  . Financial resource strain: Not on file  . Food insecurity    Worry: Not on file    Inability: Not  on file  . Transportation needs    Medical: Not on file    Non-medical: Not on file  Tobacco Use  . Smoking status: Never Smoker  . Smokeless tobacco: Never Used  Substance and Sexual Activity  . Alcohol use: No  . Drug use: No  . Sexual activity: Not on file  Lifestyle  . Physical activity    Days per week: Not on file    Minutes per session: Not on file  . Stress: Not on file  Relationships  . Social Musician on phone: Not on file    Gets together: Not on file    Attends religious service: Not on file    Active member of club or organization: Not on file    Attends meetings of clubs or organizations: Not on file    Relationship status: Not on file  Other Topics Concern   . Not on file  Social History Narrative   Drinks caffeine 4 times a week.      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   Outpatient Encounter Medications as of 02/14/2019  Medication Sig  . aspirin 81 MG chewable tablet Chew 1 tablet (81 mg total) by mouth daily.  [name] atorvastatin (LIPITOR) 40 MG tablet TAKE 1 TABLET BY MOUTH ONCE DAILY AT  6  PM  . Insulin Glargine, 1 Unit Dial, 300 UNIT/ML SOPN Inject 40 Units into the skin at bedtime.  . Insulin Pen Needle (B-D ULTRAFINE III SHORT PEN) 31G X 8 MM MISC 1 each by Does not apply route as directed.  [name] lisinopril (ZESTRIL) 10 MG tablet Take 1 tablet (10 mg total) by mouth daily.  . Melatonin 2.5 MG CAPS Take 2.5 mg by mouth at bedtime as needed (sleep).  . nitroGLYCERIN (NITROSTAT) 0.4 MG SL tablet Place 1 tablet (0.4 mg total) under the tongue every 5 (five) minutes as needed. (Patient not taking: Reported on 08/05/2017)  . sildenafil (VIAGRA) 100 MG tablet Take 100 mg by mouth as needed.  . SitaGLIPtin-MetFORMIN HCl (JANUMET XR) 50-1000 MG TB24 TAKE 1 TABLET BY MOUTH AFTER BREAKFAST  . Vitamin D, Ergocalciferol, (DRISDOL) 1.25 MG (50000 UT) CAPS capsule Take 1 capsule (50,000 Units total) by mouth every 7 (seven) days.  . [DISCONTINUED] Insulin Glargine, 1 Unit Dial, 300 UNIT/ML SOPN Inject 30 Units into the skin at bedtime.  . [DISCONTINUED] Vitamin D, Ergocalciferol, (DRISDOL) 50000 units CAPS capsule Take 1 capsule (50,000 Units total) by mouth every 7 (seven) days. (Patient not taking: Reported on 07/01/2018)   No facility-administered encounter medications on file as of 02/14/2019.     ALLERGIES: Allergies  Allergen Reactions  . Shellfish Allergy Swelling    Turns red  . Strawberry Extract Rash    VACCINATION STATUS:  There is no immunization history on file for this patient.  Diabetes He presents for his follow-up diabetic visit. He has type 2 diabetes mellitus. Onset time: He reports that he was diagnosed at approximate age of 10 years. His  disease course has been improving. There are no hypoglycemic associated symptoms. Pertinent negatives for hypoglycemia include no confusion, headaches, pallor, seizures or tremors. Pertinent negatives for diabetes include no blurred vision, no chest pain, no fatigue, no polydipsia, no polyphagia, no polyuria and no weakness. There are no hypoglycemic complications. Symptoms are improving. Diabetic complications include heart disease. Risk factors for coronary artery disease include dyslipidemia, diabetes mellitus, family history, hypertension, male sex, obesity and sedentary lifestyle. Current diabetic treatment includes oral agent (dual therapy) (  She is taking Janumet 50/1000 mg once a day, metformin 1000 mg once a day.). He is following a diabetic diet. When asked about meal planning, he reported none. He has not had a previous visit with a dietitian. He participates in exercise intermittently. His breakfast blood glucose range is generally 140-180 mg/dl. His bedtime blood glucose range is generally 140-180 mg/dl. His overall blood glucose range is 140-180 mg/dl. (He reports near target glycemic profile ranging between 141-187, and previsit A1c of 8.2% progressively improving from 9.4%.   ) An ACE inhibitor/angiotensin II receptor blocker is being taken. He does not see a podiatrist.Eye exam is not current.  Hyperlipidemia This is a chronic problem. The current episode started more than 1 year ago. The problem is controlled. Exacerbating diseases include diabetes and obesity. Pertinent negatives include no chest pain, myalgias or shortness of breath. Current antihyperlipidemic treatment includes statins. Risk factors for coronary artery disease include dyslipidemia, diabetes mellitus, hypertension, male sex, obesity, a sedentary lifestyle and family history.  Hypertension This is a chronic problem. The problem is controlled. Pertinent negatives include no blurred vision, chest pain, headaches, neck pain,  palpitations or shortness of breath. Risk factors for coronary artery disease include diabetes mellitus, dyslipidemia, obesity, male gender, sedentary lifestyle and family history. Past treatments include ACE inhibitors. Hypertensive end-organ damage includes CAD/MI.   ROS: Limited as above   Objective:    There were no vitals taken for this visit.  Wt Readings from Last 3 Encounters:  07/13/18 292 lb (132.5 kg)  07/01/18 296 lb (134.3 kg)  03/17/18 292 lb (132.5 kg)     CMP ( most recent) CMP     Component Value Date/Time   NA 140 01/31/2019 0835   NA 140 03/22/2017 1549   K 3.9 01/31/2019 0835   CL 104 01/31/2019 0835   CO2 32 01/31/2019 0835   GLUCOSE 138 (H) 01/31/2019 0835   BUN 12 01/31/2019 0835   BUN 14 03/22/2017 1549   CREATININE 1.06 01/31/2019 0835   CALCIUM 9.2 01/31/2019 0835   PROT 7.1 01/31/2019 0835   PROT 8.3 03/22/2017 1549   ALBUMIN 4.2 06/29/2017 0807   ALBUMIN 4.6 03/22/2017 1549   AST 21 01/31/2019 0835   ALT 24 01/31/2019 0835   ALKPHOS 60 06/29/2017 0807   BILITOT 0.6 01/31/2019 0835   BILITOT 0.4 03/22/2017 1549   GFRNONAA 81 01/31/2019 0835   GFRAA 94 01/31/2019 0835     Diabetic Labs (most recent): Lab Results  Component Value Date   HGBA1C 8.2 (H) 01/31/2019   HGBA1C 8.9 (H) 09/30/2018   HGBA1C 9.4 (H) 06/29/2018     Lipid Panel ( most recent) Lipid Panel     Component Value Date/Time   CHOL 126 03/01/2018 0846   CHOL 151 03/22/2017 1549   TRIG 141 03/01/2018 0846   HDL 38 (L) 03/01/2018 0846   HDL 47 03/22/2017 1549   CHOLHDL 3.3 03/01/2018 0846   VLDL 21 06/29/2017 0807   LDLCALC 66 03/01/2018 0846     Assessment & Plan:   1. DM type 2 causing vascular disease (HCC) - Patient has currently uncontrolled symptomatic type 2 DM since  53 years of age.  His pre visit labs show continued improvement with A1c of 8.2%, generally improving from 9.4%.       Recent labs reviewed with him showing normal renal function.  Is  found to have vitamin D deficiency.  -his diabetes is complicated by coronary artery disease with stent placement on  03/15/2017 and Joel Ward at extremely  high risk for more acute and chronic complications which include CAD, CVA, CKD, retinopathy, and neuropathy. These are all discussed in detail with the patient.  - I have counseled him on diet management and weight loss, by adopting a carbohydrate restricted/protein rich diet. -He did admits to dietary indiscretions including consumption of sweets and sweetened beverages.    - he  admits there is a room for improvement in his diet and drink choices. -  Suggestion is made for him to avoid simple carbohydrates  from his diet including Cakes, Sweet Desserts / Pastries, Ice Cream, Soda (diet and regular), Sweet Tea, Candies, Chips, Cookies, Sweet Pastries,  Store Bought Juices, Alcohol in Excess of  1-2 drinks a day, Artificial Sweeteners, Coffee Creamer, and "Sugar-free" Products. This will help patient to have stable blood glucose profile and potentially avoid unintended weight gain.   - I encouraged him to switch to  unprocessed or minimally processed complex starch and increased protein intake (animal or plant source), fruits, and vegetables.  - he is advised to stick to a routine mealtimes to eat 3 meals  a day and avoid unnecessary snacks ( to snack only to correct hypoglycemia).   - he has been  scheduled with Jearld Fenton, RDN, CDE for individualized DM education- consult pending.  - I have approached him with the following individualized plan to manage diabetes and patient agrees:   -Based on his  Reported BG profile 141-187, he will tolerate higher dose of basal insulin. -He is advised to increase  Tresiba to 40 units subcutaneously nightly, associated with strict monitoring of blood glucose 2 times a day-daily before breakfast and at bedtime.   -He is advised to continue Janumet 50/1000 mg ER once a day after  breakfast. -He is encouraged to call clinic for hypoglycemia below 70 and hyperglycemia above 300 mg/dL.  2) BP/HTN: he is advised to home monitor blood pressure and report if > 140/90 on 2 separate readings.  He is advised to continue his current blood pressure medications including lisinopril 10 mg p.o. daily at breakfast.    3) Lipids/HPL: His recent lipid panel showed controlled LDL at 54.  He is advised to continue atorvastatin 40 mg p.o. nightly.      4)  Weight/Diet: Obese with a BMI of 86-VEHMCNO complicating his diabetes care.   I discussed with him the fact that he would benefit the most from loss of 5 - 10% of his current body weight .  To achieve his he has to stick to the dietary and exercise regimen provided to him and will benefit from bariatric surgery.     CDE Consult has been initiated , exercise, and detailed carbohydrates information provided.  He is given brochure on bariatric surgery.  5) vitamin D deficiency: His previsit labs show vitamin D deficiency, discussed and prescribed  ergocalciferol 50,000 units weekly x12 weeks.  6) Chronic Care/Health Maintenance:  -he  is on ACEI/ARB and Statin medications and  is encouraged to continue to follow up with Ophthalmology, Dentist,  Podiatrist at least yearly or according to recommendations, and advised to  stay away from smoking. I have recommended yearly flu vaccine and pneumonia vaccination at least every 5 years; moderate intensity exercise for up to 150 minutes weekly; and  sleep for at least 7 hours a day.  - I advised patient to maintain close follow up with Redmond School, MD for primary care needs.  - Patient Care  Time Today:  25 min, of which >50% was spent in  counseling and the rest reviewing his  current and  previous labs/studies, previous treatments, his blood glucose readings, and medications' doses and developing a plan for long-term care based on the latest recommendations for standards of care.   Joel Fossa Strupp participated in the discussions, expressed understanding, and voiced agreement with the above plans.  All questions were answered to his satisfaction. he is encouraged to contact clinic should he have any questions or concerns prior to his return visit.   Follow up plan: - Return in about 4 months (around 06/16/2019) for Bring Meter and Logs- A1c in Office, Include 8 log sheets.  Marquis Lunch, MD Phone: [phone]  Fax: [phone]   02/14/2019, 9:10 AM This note was partially dictated with voice recognition software. Similar sounding words can be transcribed inadequately or may not  be corrected upon review.

## 2019-02-24 DIAGNOSIS — G4733 Obstructive sleep apnea (adult) (pediatric): Secondary | ICD-10-CM | POA: Diagnosis not present

## 2019-02-24 DIAGNOSIS — G4730 Sleep apnea, unspecified: Secondary | ICD-10-CM | POA: Diagnosis not present

## 2019-03-26 DIAGNOSIS — G4733 Obstructive sleep apnea (adult) (pediatric): Secondary | ICD-10-CM | POA: Diagnosis not present

## 2019-03-26 DIAGNOSIS — G4730 Sleep apnea, unspecified: Secondary | ICD-10-CM | POA: Diagnosis not present

## 2019-04-11 ENCOUNTER — Other Ambulatory Visit: Payer: Self-pay

## 2019-04-11 MED ORDER — INSULIN GLARGINE (1 UNIT DIAL) 300 UNIT/ML ~~LOC~~ SOPN: 40.0000 [IU] | PEN_INJECTOR | Freq: Every day | SUBCUTANEOUS | 0 refills | Status: DC

## 2019-04-16 ENCOUNTER — Other Ambulatory Visit: Payer: Self-pay | Admitting: "Endocrinology

## 2019-04-20 DIAGNOSIS — R252 Cramp and spasm: Secondary | ICD-10-CM | POA: Diagnosis not present

## 2019-04-20 DIAGNOSIS — I10 Essential (primary) hypertension: Secondary | ICD-10-CM | POA: Diagnosis not present

## 2019-04-26 DIAGNOSIS — G4730 Sleep apnea, unspecified: Secondary | ICD-10-CM | POA: Diagnosis not present

## 2019-04-26 DIAGNOSIS — G4733 Obstructive sleep apnea (adult) (pediatric): Secondary | ICD-10-CM | POA: Diagnosis not present

## 2019-05-26 DIAGNOSIS — G4730 Sleep apnea, unspecified: Secondary | ICD-10-CM | POA: Diagnosis not present

## 2019-05-26 DIAGNOSIS — G4733 Obstructive sleep apnea (adult) (pediatric): Secondary | ICD-10-CM | POA: Diagnosis not present

## 2019-06-16 ENCOUNTER — Other Ambulatory Visit: Payer: Self-pay

## 2019-06-16 ENCOUNTER — Telehealth: Payer: Self-pay | Admitting: "Endocrinology

## 2019-06-16 MED ORDER — JANUMET XR 50-1000 MG PO TB24: ORAL_TABLET | ORAL | 1 refills | Status: DC

## 2019-06-16 NOTE — Telephone Encounter (Signed)
Pt needs refill on SitaGLIPtin-MetFORMIN HCl (JANUMET XR) 50-1000 MG TB24. [facility] [place].

## 2019-06-16 NOTE — Telephone Encounter (Signed)
Rx refill sent.

## 2019-06-19 ENCOUNTER — Ambulatory Visit: Payer: BC Managed Care – PPO | Admitting: "Endocrinology

## 2019-06-21 DIAGNOSIS — U071 COVID-19: Secondary | ICD-10-CM | POA: Diagnosis not present

## 2019-06-26 DIAGNOSIS — G4730 Sleep apnea, unspecified: Secondary | ICD-10-CM | POA: Diagnosis not present

## 2019-06-26 DIAGNOSIS — G4733 Obstructive sleep apnea (adult) (pediatric): Secondary | ICD-10-CM | POA: Diagnosis not present

## 2019-07-07 ENCOUNTER — Other Ambulatory Visit: Payer: Self-pay

## 2019-07-07 ENCOUNTER — Ambulatory Visit: Payer: BC Managed Care – PPO | Admitting: "Endocrinology

## 2019-07-07 ENCOUNTER — Encounter: Payer: Self-pay | Admitting: "Endocrinology

## 2019-07-07 VITALS — BP 147/88 | HR 76 | Ht 71.0 in | Wt 303.6 lb

## 2019-07-07 DIAGNOSIS — E1159 Type 2 diabetes mellitus with other circulatory complications: Secondary | ICD-10-CM

## 2019-07-07 DIAGNOSIS — I10 Essential (primary) hypertension: Secondary | ICD-10-CM | POA: Diagnosis not present

## 2019-07-07 DIAGNOSIS — E782 Mixed hyperlipidemia: Secondary | ICD-10-CM

## 2019-07-07 LAB — POCT GLYCOSYLATED HEMOGLOBIN (HGB A1C): Hemoglobin A1C: 11.8 % — AB (ref 4.0–5.6)

## 2019-07-07 MED ORDER — GLIPIZIDE ER 5 MG PO TB24: 5.0000 mg | ORAL_TABLET | Freq: Every day | ORAL | 3 refills | Status: DC

## 2019-07-07 MED ORDER — TOUJEO SOLOSTAR 300 UNIT/ML ~~LOC~~ SOPN: 50.0000 [IU] | PEN_INJECTOR | SUBCUTANEOUS | 2 refills | Status: DC

## 2019-07-07 NOTE — Progress Notes (Signed)
07/07/2019                                                     Endocrinology Telehealth Visit Follow up Note -During COVID -19 Pandemic  This visit type was conducted due to national recommendations for restrictions regarding the COVID-19 Pandemic  in an effort to limit this patient's exposure and mitigate transmission of the corona virus.  Due to his co-morbid illnesses, Joel Ward is at  moderate to high risk for complications without adequate follow up.  This format is felt to be most appropriate for him at this time.  I connected with this patient on 07/07/2019   by telephone and verified that I am speaking with the correct person using two identifiers. Joel Ward, Feb 01, 1966. he has verbally consented to this visit. All issues noted in this document were discussed and addressed. The format was not optimal for physical exam.    Subjective:    [name], male    DOB: 04/30/1966.  he is being engaged in telehealth in follow up  for management of currently uncontrolled symptomatic uncontrolled type 2 diabetes, hyperlipidemia, hypertension.  PMD:   Redmond School, MD.   Past Medical History:  Diagnosis Date  . CAD (coronary artery disease), native coronary artery    10/18 PCI/DES mLAD, normal EF on LV gram  . Diabetes mellitus    Type 2 diagnosed 2 weeks ago  . Hiatal hernia with gastroesophageal reflux   . Hypertension   . Mixed hyperlipidemia   . Morbid obesity ([place])   . OSA on CPAP   . Urinary frequency    Past Surgical History:  Procedure Laterality Date  . CIRCUMCISION     1988  . CORONARY STENT INTERVENTION N/A 03/16/2017   Procedure: CORONARY STENT INTERVENTION;  Surgeon: Belva Crome, MD;  Location: [facility];  Service: Cardiovascular;  Laterality: N/A;  . ESOPHAGEAL DILATION N/A 02/08/2015   Procedure: ESOPHAGEAL DILATION;  Surgeon: Rogene Houston, MD;  [facility]: [facility];  Service: Endoscopy;  Laterality: N/A;  .  ESOPHAGOGASTRODUODENOSCOPY N/A 02/08/2015   Procedure: ESOPHAGOGASTRODUODENOSCOPY (EGD);  Surgeon: Rogene Houston, MD;  [facility]: [facility];  Service: Endoscopy;  Laterality: N/A;  1055  . ESOPHAGOGASTRODUODENOSCOPY (EGD) WITH ESOPHAGEAL DILATION  03/29/2012   Procedure: ESOPHAGOGASTRODUODENOSCOPY (EGD) WITH ESOPHAGEAL DILATION;  Surgeon: Rogene Houston, MD;  [facility];  Service: Endoscopy;  Laterality: N/A;  730  . LEFT HEART CATH AND CORONARY ANGIOGRAPHY N/A 03/16/2017   Procedure: LEFT HEART CATH AND CORONARY ANGIOGRAPHY;  Surgeon: Belva Crome, MD;  Location: [facility];  Service: Cardiovascular;  Laterality: N/A;  . ULTRASOUND GUIDANCE FOR VASCULAR ACCESS  03/16/2017   Procedure: Ultrasound Guidance For Vascular Access;  Surgeon: Belva Crome, MD;  Location: [facility];  Service: Cardiovascular;;   Social History   Socioeconomic History  . Marital status: Married    Spouse name: Not on file  . Number of children: Not on file  . Years of education: Not on file  . Highest education level: Not on file  Occupational History  . Not on file  Tobacco Use  . Smoking status: Never Smoker  . Smokeless tobacco: Never Used  Substance and Sexual Activity  . Alcohol use: No  .  Drug use: No  . Sexual activity: Not on file  Other Topics Concern  . Not on file  Social History Narrative   Drinks caffeine 4 times a week.      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   Social Determinants of Health   Financial Resource Strain:   . Difficulty of Paying Living Expenses: Not on file  Food Insecurity:   . Worried About Programme researcher, broadcasting/film/video in the Last Year: Not on file  . Ran Out of Food in the Last Year: Not on file  Transportation Needs:   . Lack of Transportation (Medical): Not on file  . Lack of Transportation (Non-Medical): Not on file  Physical Activity:   . Days of Exercise per Week: Not on file  . Minutes of Exercise per Session: Not on file  Stress:   .  Feeling of Stress : Not on file  Social Connections:   . Frequency of Communication with Friends and Family: Not on file  . Frequency of Social Gatherings with Friends and Family: Not on file  . Attends Religious Services: Not on file  . Active Member of Clubs or Organizations: Not on file  . Attends Banker Meetings: Not on file  . Marital Status: Not on file   Outpatient Encounter Medications as of 07/07/2019  Medication Sig  . aspirin 81 MG chewable tablet Chew 1 tablet (81 mg total) by mouth daily.  [name] atorvastatin (LIPITOR) 40 MG tablet TAKE 1 TABLET BY MOUTH ONCE DAILY AT  6  PM  . glipiZIDE (GLUCOTROL XL) 5 MG 24 hr tablet Take 1 tablet (5 mg total) by mouth daily with breakfast.  . Insulin Glargine, 1 Unit Dial, (TOUJEO SOLOSTAR) 300 UNIT/ML SOPN Inject 50 Units into the skin daily.  . Insulin Pen Needle (B-D ULTRAFINE III SHORT PEN) 31G X 8 MM MISC 1 each by Does not apply route as directed.  [name] lisinopril (ZESTRIL) 10 MG tablet Take 1 tablet (10 mg total) by mouth daily.  . Melatonin 2.5 MG CAPS Take 2.5 mg by mouth at bedtime as needed (sleep).  . nitroGLYCERIN (NITROSTAT) 0.4 MG SL tablet Place 1 tablet (0.4 mg total) under the tongue every 5 (five) minutes as needed. (Patient not taking: Reported on 08/05/2017)  . sildenafil (VIAGRA) 100 MG tablet Take 100 mg by mouth as needed.  . SitaGLIPtin-MetFORMIN HCl (JANUMET XR) 50-1000 MG TB24 TAKE 1 TABLET BY MOUTH AFTER BREAKFAST  . Vitamin D, Ergocalciferol, (DRISDOL) 1.25 MG (50000 UT) CAPS capsule Take 1 capsule (50,000 Units total) by mouth every 7 (seven) days.  . [DISCONTINUED] Insulin Glargine, 1 Unit Dial, (TOUJEO SOLOSTAR) 300 UNIT/ML SOPN Inject 50 Units into the skin daily.  . [DISCONTINUED] TOUJEO SOLOSTAR 300 UNIT/ML SOPN INJECT 20 UNITS SUBCUTANEOUSLY AT BEDTIME   No facility-administered encounter medications on file as of 07/07/2019.    ALLERGIES: Allergies  Allergen Reactions  . Bee Venom   . Shellfish  Allergy Swelling    Turns red  . Strawberry Extract Rash    VACCINATION STATUS:  There is no immunization history on file for this patient.  Diabetes He presents for his follow-up diabetic visit. He has type 2 diabetes mellitus. Onset time: He reports that he was diagnosed at approximate age of 57 years. His disease course has been worsening. There are no hypoglycemic associated symptoms. Pertinent negatives for hypoglycemia include no confusion, headaches, pallor, seizures or tremors. Associated symptoms include polydipsia and polyuria. Pertinent negatives for diabetes include no blurred vision,  no chest pain, no fatigue, no polyphagia and no weakness. There are no hypoglycemic complications. Symptoms are worsening. Diabetic complications include heart disease. Risk factors for coronary artery disease include dyslipidemia, diabetes mellitus, family history, hypertension, male sex, obesity and sedentary lifestyle. Current diabetic treatment includes oral agent (dual therapy) (She is taking Janumet 50/1000 mg once a day, metformin 1000 mg once a day.). He is following a diabetic diet. When asked about meal planning, he reported none. He has not had a previous visit with a dietitian. He participates in exercise intermittently. His breakfast blood glucose range is generally 140-180 mg/dl. His bedtime blood glucose range is generally 140-180 mg/dl. His overall blood glucose range is 140-180 mg/dl. (He did not bring any logs nor meter to review with him.  He reports that he has been running hypoglycemia greater than 200 mg per DL persistently.  His point-of-care A1c today is 11.8%, increasing from 8.2%.   ) An ACE inhibitor/angiotensin II receptor blocker is being taken. He does not see a podiatrist.Eye exam is not current.  Hyperlipidemia This is a chronic problem. The current episode started more than 1 year ago. The problem is controlled. Exacerbating diseases include diabetes and obesity. Pertinent  negatives include no chest pain, myalgias or shortness of breath. Current antihyperlipidemic treatment includes statins. Risk factors for coronary artery disease include dyslipidemia, diabetes mellitus, hypertension, male sex, obesity, a sedentary lifestyle and family history.  Hypertension This is a chronic problem. The problem is controlled. Pertinent negatives include no blurred vision, chest pain, headaches, neck pain, palpitations or shortness of breath. Risk factors for coronary artery disease include diabetes mellitus, dyslipidemia, obesity, male gender, sedentary lifestyle and family history. Past treatments include ACE inhibitors. Hypertensive end-organ damage includes CAD/MI.    Review of systems  Constitutional: + Minimally fluctuating body weight,  current  Body mass index is 42.34 kg/m. , no fatigue, no subjective hyperthermia, no subjective hypothermia Eyes: no blurry vision, no xerophthalmia ENT: no sore throat, no nodules palpated in throat, no dysphagia/odynophagia, no hoarseness Cardiovascular: no Chest Pain, no Shortness of Breath, no palpitations, no leg swelling Respiratory: no cough, no shortness of breath Gastrointestinal: no Nausea/Vomiting/Diarhhea Musculoskeletal: no muscle/joint aches Skin: no rashes Neurological: no tremors, no numbness, no tingling, no dizziness Psychiatric: no depression, no anxiety    Objective:    BP (!) 147/88   Pulse 76   Ht 5\' 11"  (1.803 m)   Wt (!) 303 lb 9.6 oz (137.7 kg)   BMI 42.34 kg/m   Wt Readings from Last 3 Encounters:  07/07/19 (!) 303 lb 9.6 oz (137.7 kg)  07/13/18 292 lb (132.5 kg)  07/01/18 296 lb (134.3 kg)     Physical Exam- Limited  Constitutional:  Body mass index is 42.34 kg/m. , not in acute distress, normal state of mind Eyes:  EOMI, no exophthalmos Neck: Supple Respiratory: Adequate breathing efforts Musculoskeletal: no gross deformities, strength intact in all four extremities, no gross restriction of  joint movements Skin:  no rashes, no hyperemia Neurological: no tremor with outstretched hands.  CMP ( most recent) CMP     Component Value Date/Time   NA 140 01/31/2019 0835   NA 140 03/22/2017 1549   K 3.9 01/31/2019 0835   CL 104 01/31/2019 0835   CO2 32 01/31/2019 0835   GLUCOSE 138 (H) 01/31/2019 0835   BUN 12 01/31/2019 0835   BUN 14 03/22/2017 1549   CREATININE 1.06 01/31/2019 0835   CALCIUM 9.2 01/31/2019 0835   PROT 7.1  01/31/2019 0835   PROT 8.3 03/22/2017 1549   ALBUMIN 4.2 06/29/2017 0807   ALBUMIN 4.6 03/22/2017 1549   AST 21 01/31/2019 0835   ALT 24 01/31/2019 0835   ALKPHOS 60 06/29/2017 0807   BILITOT 0.6 01/31/2019 0835   BILITOT 0.4 03/22/2017 1549   GFRNONAA 81 01/31/2019 0835   GFRAA 94 01/31/2019 0835     Diabetic Labs (most recent): Lab Results  Component Value Date   HGBA1C 11.8 (A) 07/07/2019   HGBA1C 8.2 (H) 01/31/2019   HGBA1C 8.9 (H) 09/30/2018     Lipid Panel ( most recent) Lipid Panel     Component Value Date/Time   CHOL 126 03/01/2018 0846   CHOL 151 03/22/2017 1549   TRIG 141 03/01/2018 0846   HDL 38 (L) 03/01/2018 0846   HDL 47 03/22/2017 1549   CHOLHDL 3.3 03/01/2018 0846   VLDL 21 06/29/2017 0807   LDLCALC 66 03/01/2018 0846     Assessment & Plan:   1. DM type 2 causing vascular disease ([facility]) - Patient has currently uncontrolled symptomatic type 2 DM since  54 years of age.  He did not bring any logs nor meter to review with him.  He reports that he has been running hypoglycemia greater than 200 mg per DL persistently.  His point-of-care A1c today is 11.8%, increasing from 8.2%.     Recent labs reviewed with him showing normal renal function.  Is found to have vitamin D deficiency.  -his diabetes is complicated by coronary artery disease with stent placement on 03/15/2017 and Joel Ward remains at extremely  high risk for more acute and chronic complications which include CAD, CVA, CKD, retinopathy, and  neuropathy. These are all discussed in detail with the patient.  - I have counseled him on diet management and weight loss, by adopting a carbohydrate restricted/protein rich diet. -He did admits to dietary indiscretions including consumption of sweets and sweetened beverages.    - he  admits there is a room for improvement in his diet and drink choices. -  Suggestion is made for him to avoid simple carbohydrates  from his diet including Cakes, Sweet Desserts / Pastries, Ice Cream, Soda (diet and regular), Sweet Tea, Candies, Chips, Cookies, Sweet Pastries,  Store Bought Juices, Alcohol in Excess of  1-2 drinks a day, Artificial Sweeteners, Coffee Creamer, and "Sugar-free" Products. This will help patient to have stable blood glucose profile and potentially avoid unintended weight gain.  - I encouraged him to switch to  unprocessed or minimally processed complex starch and increased protein intake (animal or plant source), fruits, and vegetables.  - he is advised to stick to a routine mealtimes to eat 3 meals  a day and avoid unnecessary snacks ( to snack only to correct hypoglycemia).   - he has been  scheduled with Norm Salt, RDN, CDE for individualized DM education- consult pending.  - I have approached him with the following individualized plan to manage diabetes and patient agrees:   -Based on his loss of control with A1c of 11.8%, he will likely require intensive treatment with basal/bolus insulin in order for him to achieve and maintain control of diabetes to target. -In preparation, he is advised to start monitoring blood glucose 4 times daily-daily before meals and at bedtime and return in 10 days with his meter and logs.    -In the meantime, he is advised to increase Toujeo to 50 units subcutaneous every 24 hours.   -He is advised to  continue Janumet 50/1000 mg ER once a day after breakfast. -He is encouraged to call clinic for hypoglycemia below 70 and hyperglycemia above 300  mg/dL. -I discussed and added glipizide 5 mg XL p.o. daily at breakfast.  2) BP/HTN: His blood pressure is not controlled to target this morning.  He works overnight shifts and coming from work.  He is advised to continue his current blood pressure medications including lisinopril 10 mg p.o. daily at breakfast.    3) Lipids/HPL: His recent lipid panel showed controlled LDL at 66.  He is advised to continue atorvastatin 40 mg p.o. nightly.  Side effects and precautions discussed with him.    4)  Weight/Diet: Obese with a BMI of 40-clearly complicating his diabetes care.   I discussed with him the fact that he would benefit the most from loss of 5 - 10% of his current body weight .  To achieve his he has to stick to the dietary and exercise regimen provided to him and will benefit from bariatric surgery.     CDE Consult has been initiated , exercise, and detailed carbohydrates information provided.  He is given brochure on bariatric surgery.  5) vitamin D deficiency: He is on ongoing vitamin D treatment, 50,000 units of ergocalciferol weekly.    6) Chronic Care/Health Maintenance:  -he  is on ACEI/ARB and Statin medications and  is encouraged to continue to follow up with Ophthalmology, Dentist,  Podiatrist at least yearly or according to recommendations, and advised to  stay away from smoking. I have recommended yearly flu vaccine and pneumonia vaccination at least every 5 years; moderate intensity exercise for up to 150 minutes weekly; and  sleep for at least 7 hours a day.  - I advised patient to maintain close follow up with Elfredia Nevins, MD for primary care needs.  - Time spent on this patient care encounter:  35 min, of which > 50% was spent in  counseling and the rest reviewing his blood glucose logs , discussing his hypoglycemia and hyperglycemia episodes, reviewing his current and  previous labs / studies  ( including abstraction from other facilities) and medications  doses and  developing a  long term treatment plan and documenting his care.   Please refer to Patient Instructions for Blood Glucose Monitoring and Insulin/Medications Dosing Guide"  in media tab for additional information. Please  also refer to " Patient Self Inventory" in the Media  tab for reviewed elements of pertinent patient history.  Army Fossa Howry participated in the discussions, expressed understanding, and voiced agreement with the above plans.  All questions were answered to his satisfaction. he is encouraged to contact clinic should he have any questions or concerns prior to his return visit.   Follow up plan: - Return in about 10 days (around 07/17/2019), or office, for Follow up with Meter and Logs Only - no Labs.  Marquis Lunch, MD Phone: [phone]  Fax: [phone]   07/07/2019, 12:51 PM This note was partially dictated with voice recognition software. Similar sounding words can be transcribed inadequately or may not  be corrected upon review.

## 2019-07-07 NOTE — Patient Instructions (Signed)

## 2019-07-18 ENCOUNTER — Encounter: Payer: Self-pay | Admitting: "Endocrinology

## 2019-07-18 ENCOUNTER — Ambulatory Visit (INDEPENDENT_AMBULATORY_CARE_PROVIDER_SITE_OTHER): Payer: BC Managed Care – PPO | Admitting: "Endocrinology

## 2019-07-18 DIAGNOSIS — E1159 Type 2 diabetes mellitus with other circulatory complications: Secondary | ICD-10-CM

## 2019-07-18 DIAGNOSIS — I10 Essential (primary) hypertension: Secondary | ICD-10-CM | POA: Diagnosis not present

## 2019-07-18 DIAGNOSIS — E782 Mixed hyperlipidemia: Secondary | ICD-10-CM | POA: Diagnosis not present

## 2019-07-18 DIAGNOSIS — E559 Vitamin D deficiency, unspecified: Secondary | ICD-10-CM | POA: Diagnosis not present

## 2019-07-18 NOTE — Progress Notes (Signed)
07/18/2019                                                     Endocrinology Telehealth Visit Follow up Note -During COVID -19 Pandemic  This visit type was conducted due to national recommendations for restrictions regarding the COVID-19 Pandemic  in an effort to limit this patient's exposure and mitigate transmission of the corona virus.  Due to his co-morbid illnesses, Joel Ward is at  moderate to high risk for complications without adequate follow up.  This format is felt to be most appropriate for him at this time.  I connected with this patient on 07/18/2019   by telephone and verified that I am speaking with the correct person using two identifiers. Joel Ward, 02-Feb-1966. he has verbally consented to this visit. All issues noted in this document were discussed and addressed. The format was not optimal for physical exam.    Subjective:    [name], male    DOB: 20-Mar-1966.  he is being engaged in telehealth in follow up  for management of currently uncontrolled symptomatic uncontrolled type 2 diabetes, hyperlipidemia, hypertension.  PMD:   Joel Nevins, MD.   Past Medical History:  Diagnosis Date  . CAD (coronary artery disease), native coronary artery    10/18 PCI/DES mLAD, normal EF on LV gram  . Diabetes mellitus    Type 2 diagnosed 2 weeks ago  . Hiatal hernia with gastroesophageal reflux   . Hypertension   . Mixed hyperlipidemia   . Morbid obesity (HCC)   . OSA on CPAP   . Urinary frequency    Past Surgical History:  Procedure Laterality Date  . CIRCUMCISION     1988  . CORONARY STENT INTERVENTION N/A 03/16/2017   Procedure: CORONARY STENT INTERVENTION;  Surgeon: Lyn Records, MD;  Location: [facility] INVASIVE CV LAB;  Service: Cardiovascular;  Laterality: N/A;  . ESOPHAGEAL DILATION N/A 02/08/2015   Procedure: ESOPHAGEAL DILATION;  Surgeon: Malissa Hippo, MD;  Location: [facility] ENDO SUITE;  Service: Endoscopy;  Laterality: N/A;  .  ESOPHAGOGASTRODUODENOSCOPY N/A 02/08/2015   Procedure: ESOPHAGOGASTRODUODENOSCOPY (EGD);  Surgeon: Malissa Hippo, MD;  Location: [facility] ENDO SUITE;  Service: Endoscopy;  Laterality: N/A;  1055  . ESOPHAGOGASTRODUODENOSCOPY (EGD) WITH ESOPHAGEAL DILATION  03/29/2012   Procedure: ESOPHAGOGASTRODUODENOSCOPY (EGD) WITH ESOPHAGEAL DILATION;  Surgeon: Malissa Hippo, MD;  Location: [facility] ENDO SUITE;  Service: Endoscopy;  Laterality: N/A;  730  . LEFT HEART CATH AND CORONARY ANGIOGRAPHY N/A 03/16/2017   Procedure: LEFT HEART CATH AND CORONARY ANGIOGRAPHY;  Surgeon: Lyn Records, MD;  Location: [facility] INVASIVE CV LAB;  Service: Cardiovascular;  Laterality: N/A;  . ULTRASOUND GUIDANCE FOR VASCULAR ACCESS  03/16/2017   Procedure: Ultrasound Guidance For Vascular Access;  Surgeon: Lyn Records, MD;  Location: [facility] INVASIVE CV LAB;  Service: Cardiovascular;;   Social History   Socioeconomic History  . Marital status: Married    Spouse name: Not on file  . Number of children: Not on file  . Years of education: Not on file  . Highest education level: Not on file  Occupational History  . Not on file  Tobacco Use  . Smoking status: Never Smoker  . Smokeless tobacco: Never Used  Substance and Sexual Activity  . Alcohol use: No  .  Drug use: No  . Sexual activity: Not on file  Other Topics Concern  . Not on file  Social History Narrative   Drinks caffeine 4 times a week.      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   Social Determinants of Health   Financial Resource Strain:   . Difficulty of Paying Living Expenses: Not on file  Food Insecurity:   . Worried About Programme researcher, broadcasting/film/video in the Last Year: Not on file  . Ran Out of Food in the Last Year: Not on file  Transportation Needs:   . Lack of Transportation (Medical): Not on file  . Lack of Transportation (Non-Medical): Not on file  Physical Activity:   . Days of Exercise per Week: Not on file  . Minutes of Exercise per Session: Not on file  Stress:   .  Feeling of Stress : Not on file  Social Connections:   . Frequency of Communication with Friends and Family: Not on file  . Frequency of Social Gatherings with Friends and Family: Not on file  . Attends Religious Services: Not on file  . Active Member of Clubs or Organizations: Not on file  . Attends Banker Meetings: Not on file  . Marital Status: Not on file   Outpatient Encounter Medications as of 07/18/2019  Medication Sig  . aspirin 81 MG chewable tablet Chew 1 tablet (81 mg total) by mouth daily.  [name] atorvastatin (LIPITOR) 40 MG tablet TAKE 1 TABLET BY MOUTH ONCE DAILY AT  6  PM  . glipiZIDE (GLUCOTROL XL) 5 MG 24 hr tablet Take 1 tablet (5 mg total) by mouth daily with breakfast.  . Insulin Glargine, 1 Unit Dial, (TOUJEO SOLOSTAR) 300 UNIT/ML SOPN Inject 50 Units into the skin daily.  . Insulin Pen Needle (B-D ULTRAFINE III SHORT PEN) 31G X 8 MM MISC 1 each by Does not apply route as directed.  [name] lisinopril (ZESTRIL) 10 MG tablet Take 1 tablet (10 mg total) by mouth daily.  . Melatonin 2.5 MG CAPS Take 2.5 mg by mouth at bedtime as needed (sleep).  . nitroGLYCERIN (NITROSTAT) 0.4 MG SL tablet Place 1 tablet (0.4 mg total) under the tongue every 5 (five) minutes as needed. (Patient not taking: Reported on 08/05/2017)  . sildenafil (VIAGRA) 100 MG tablet Take 100 mg by mouth as needed.  . SitaGLIPtin-MetFORMIN HCl (JANUMET XR) 50-1000 MG TB24 TAKE 1 TABLET BY MOUTH AFTER BREAKFAST  . Vitamin D, Ergocalciferol, (DRISDOL) 1.25 MG (50000 UT) CAPS capsule Take 1 capsule (50,000 Units total) by mouth every 7 (seven) days.   No facility-administered encounter medications on file as of 07/18/2019.    ALLERGIES: Allergies  Allergen Reactions  . Bee Venom   . Shellfish Allergy Swelling    Turns red  . Strawberry Extract Rash    VACCINATION STATUS:  There is no immunization history on file for this patient.  Diabetes He presents for his follow-up diabetic visit. He has type  2 diabetes mellitus. Onset time: He reports that he was diagnosed at approximate age of 72 years. His disease course has been improving. There are no hypoglycemic associated symptoms. Pertinent negatives for hypoglycemia include no confusion, headaches, pallor, seizures or tremors. Associated symptoms include polydipsia and polyuria. Pertinent negatives for diabetes include no blurred vision, no chest pain, no fatigue, no polyphagia and no weakness. There are no hypoglycemic complications. Symptoms are improving. Diabetic complications include heart disease. Risk factors for coronary artery disease include dyslipidemia, diabetes mellitus, family  history, hypertension, male sex, obesity and sedentary lifestyle. Current diabetic treatment includes oral agent (dual therapy). He is following a diabetic diet. When asked about meal planning, he reported none. He has not had a previous visit with a dietitian. He participates in exercise intermittently. His breakfast blood glucose range is generally 140-180 mg/dl. His lunch blood glucose range is generally 140-180 mg/dl. His dinner blood glucose range is generally 140-180 mg/dl. His bedtime blood glucose range is generally 140-180 mg/dl. His overall blood glucose range is 140-180 mg/dl. (He reports significant improvement in his glycemic profile since last visit.  Fasting ranging between 95-148, lunchtime between 100-153, suppertime between 158-183, bedtime between 113-151.  This is despite his recent point-of-care A1c was 11.8%  ) An ACE inhibitor/angiotensin II receptor blocker is being taken. He does not see a podiatrist.Eye exam is not current.  Hyperlipidemia This is a chronic problem. The current episode started more than 1 year ago. The problem is controlled. Exacerbating diseases include diabetes and obesity. Pertinent negatives include no chest pain, myalgias or shortness of breath. Current antihyperlipidemic treatment includes statins. Risk factors for  coronary artery disease include dyslipidemia, diabetes mellitus, hypertension, male sex, obesity, a sedentary lifestyle and family history.  Hypertension This is a chronic problem. The problem is controlled. Pertinent negatives include no blurred vision, chest pain, headaches, neck pain, palpitations or shortness of breath. Risk factors for coronary artery disease include diabetes mellitus, dyslipidemia, obesity, male gender, sedentary lifestyle and family history. Past treatments include ACE inhibitors. Hypertensive end-organ damage includes CAD/MI.    Review of systems Limited as above.   Objective:    There were no vitals taken for this visit.  Wt Readings from Last 3 Encounters:  07/07/19 (!) 303 lb 9.6 oz (137.7 kg)  07/13/18 292 lb (132.5 kg)  07/01/18 296 lb (134.3 kg)      CMP ( most recent) CMP     Component Value Date/Time   NA 140 01/31/2019 0835   NA 140 03/22/2017 1549   K 3.9 01/31/2019 0835   CL 104 01/31/2019 0835   CO2 32 01/31/2019 0835   GLUCOSE 138 (H) 01/31/2019 0835   BUN 12 01/31/2019 0835   BUN 14 03/22/2017 1549   CREATININE 1.06 01/31/2019 0835   CALCIUM 9.2 01/31/2019 0835   PROT 7.1 01/31/2019 0835   PROT 8.3 03/22/2017 1549   ALBUMIN 4.2 06/29/2017 0807   ALBUMIN 4.6 03/22/2017 1549   AST 21 01/31/2019 0835   ALT 24 01/31/2019 0835   ALKPHOS 60 06/29/2017 0807   BILITOT 0.6 01/31/2019 0835   BILITOT 0.4 03/22/2017 1549   GFRNONAA 81 01/31/2019 0835   GFRAA 94 01/31/2019 0835     Diabetic Labs (most recent): Lab Results  Component Value Date   HGBA1C 11.8 (A) 07/07/2019   HGBA1C 8.2 (H) 01/31/2019   HGBA1C 8.9 (H) 09/30/2018     Lipid Panel ( most recent) Lipid Panel     Component Value Date/Time   CHOL 126 03/01/2018 0846   CHOL 151 03/22/2017 1549   TRIG 141 03/01/2018 0846   HDL 38 (L) 03/01/2018 0846   HDL 47 03/22/2017 1549   CHOLHDL 3.3 03/01/2018 0846   VLDL 21 06/29/2017 0807   LDLCALC 66 03/01/2018 0846      Assessment & Plan:   1. DM type 2 causing vascular disease (HCC) - Patient has currently uncontrolled symptomatic type 2 DM since  54 years of age.  He reports significant improvement in his glycemic profile since  last visit.  Fasting ranging between 95-148, lunchtime between 100-153, suppertime between 158-183, bedtime between 113-151.  This is despite his recent point-of-care A1c was 11.8%     Recent labs reviewed with him showing normal renal function.  Is found to have vitamin D deficiency.  -his diabetes is complicated by coronary artery disease with stent placement on 03/15/2017 and Joel Ward remains at extremely  high risk for more acute and chronic complications which include CAD, CVA, CKD, retinopathy, and neuropathy. These are all discussed in detail with the patient.  - I have counseled him on diet management and weight loss, by adopting a carbohydrate restricted/protein rich diet. -He did admits to dietary indiscretions including consumption of sweets and sweetened beverages.    - he  admits there is a room for improvement in his diet and drink choices. -  Suggestion is made for him to avoid simple carbohydrates  from his diet including Cakes, Sweet Desserts / Pastries, Ice Cream, Soda (diet and regular), Sweet Tea, Candies, Chips, Cookies, Sweet Pastries,  Store Bought Juices, Alcohol in Excess of  1-2 drinks a day, Artificial Sweeteners, Coffee Creamer, and "Sugar-free" Products. This will help patient to have stable blood glucose profile and potentially avoid unintended weight gain.   - I encouraged him to switch to  unprocessed or minimally processed complex starch and increased protein intake (animal or plant source), fruits, and vegetables.  - he is advised to stick to a routine mealtimes to eat 3 meals  a day and avoid unnecessary snacks ( to snack only to correct hypoglycemia).   - he has been  scheduled with Norm Salt, RDN, CDE for individualized DM  education- consult pending.  - I have approached him with the following individualized plan to manage diabetes and patient agrees:   -He is responding to his new regimen.  Based on his glycemic response, he is advised to continue Toujeo 15 units nightly/every 24 hours.    -He is urged to continue to monitor blood glucose at least twice a day-daily before breakfast and at bedtime.     -He is advised to continue Janumet 50/1000 mg ER once a day after breakfast. -He is encouraged to call clinic for hypoglycemia below 70 and hyperglycemia above 200 mg/dL. -He is benefiting from low-dose glipizide, he is advised to continue glipizide 5 mg XL p.o. daily at breakfast.  2) BP/HTN: he is advised to home monitor blood pressure and report if > 140/90 on 2 separate readings.   He is advised to continue his current blood pressure medications including lisinopril 10 mg p.o. daily at breakfast.    3) Lipids/HPL: His recent lipid panel showed controlled LDL at 66.  He is advised to continue atorvastatin 40 mg p.o. nightly.    Side effects and precautions discussed with him.    4)  Weight/Diet: Obese with a BMI of 40-clearly complicating his diabetes care.   I discussed with him the fact that he would benefit the most from loss of 5 - 10% of his current body weight .  To achieve his he has to stick to the dietary and exercise regimen provided to him and will benefit from bariatric surgery.     CDE Consult has been initiated , exercise, and detailed carbohydrates information provided.  He is given brochure on bariatric surgery.  5) vitamin D deficiency: He is on ongoing vitamin D treatment, 50,000 units of ergocalciferol weekly.    6) Chronic Care/Health Maintenance:  -he  is  on ACEI/ARB and Statin medications and  is encouraged to continue to follow up with Ophthalmology, Dentist,  Podiatrist at least yearly or according to recommendations, and advised to  stay away from smoking. I have recommended yearly  flu vaccine and pneumonia vaccination at least every 5 years; moderate intensity exercise for up to 150 minutes weekly; and  sleep for at least 7 hours a day.  - I advised patient to maintain close follow up with Redmond School, MD for primary care needs.  - Time spent on this patient care encounter:  35 min, of which >50% was spent in  counseling and the rest reviewing his  current and  previous labs/studies ( including abstraction from other facilities),  previous treatments, his blood glucose readings, and medications' doses and developing a plan for long-term care based on the latest recommendations for standards of care; and documenting his care.  Sherre Poot Caspers participated in the discussions, expressed understanding, and voiced agreement with the above plans.  All questions were answered to his satisfaction. he is encouraged to contact clinic should he have any questions or concerns prior to his return visit.  Follow up plan: - Return in about 3 months (around 10/15/2019) for Bring Meter and Logs- A1c in Office, Include 8 log sheets, Follow up with Pre-visit Labs.  Glade Lloyd, MD Phone: [phone]  Fax: [phone]   07/18/2019, 9:06 AM This note was partially dictated with voice recognition software. Similar sounding words can be transcribed inadequately or may not  be corrected upon review.

## 2019-07-18 NOTE — Patient Instructions (Signed)
                                     Advice for Weight Management  -For most of us the best way to lose weight is by diet management. Generally speaking, diet management means consuming less calories intentionally which over time brings about progressive weight loss.  This can be achieved more effectively by restricting carbohydrate consumption to the minimum possible.  So, it is critically important to know your numbers: how much calorie you are consuming and how much calorie you need. More importantly, our carbohydrates sources should be unprocessed or minimally processed complex starch food items.   Sometimes, it is important to balance nutrition by increasing protein intake (animal or plant source), fruits, and vegetables.  -Sticking to a routine mealtime to eat 3 meals a day and avoiding unnecessary snacks is shown to have a big role in weight control. Under normal circumstances, the only time we lose real weight is when we are hungry, so allow hunger to take place- hunger means no food between meal times, only water.  It is not advisable to starve.   -It is better to avoid simple carbohydrates including: Cakes, Sweet Desserts, Ice Cream, Soda (diet and regular), Sweet Tea, Candies, Chips, Cookies, Store Bought Juices, Alcohol in Excess of  1-2 drinks a day, Artificial Sweeteners, Doughnuts, Coffee Creamers, "Sugar-free" Products, etc, etc.  This is not a complete list.....    -Consulting with certified diabetes educators is proven to provide you with the most accurate and current information on diet.  Also, you may be  interested in discussing diet options/exchanges , we can schedule a visit with Joel Ward, RDN, CDE for individualized nutrition education.  -Exercise: If you are able: 30 -60 minutes a day ,4 days a week, or 150 minutes a week.  The longer the better.  Combine stretch, strength, and aerobic activities.  If you were told in the past that you  have high risk for cardiovascular diseases, you may seek evaluation by your heart doctor prior to initiating moderate to intense exercise programs.                                  Additional Care Considerations for Diabetes   -Diabetes  is a chronic disease.  The most important care consideration is regular follow-up with your diabetes care provider with the goal being avoiding or delaying its complications and to take advantage of advances in medications and technology.    -Type 2 diabetes is known to coexist with other important comorbidities such as high blood pressure and high cholesterol.  It is critical to control not only the diabetes but also the high blood pressure and high cholesterol to minimize and delay the risk of complications including coronary artery disease, stroke, amputations, blindness, etc.    - Studies showed that people with diabetes will benefit from a class of medications known as ACE inhibitors and statins.  Unless there are specific reasons not to be on these medications, the standard of care is to consider getting one from these groups of medications at an optimal doses.  These medications are generally considered safe and proven to help protect the heart and the kidneys.    - People with diabetes are encouraged to initiate and maintain regular follow-up with eye doctors, foot doctors, dentists ,   and if necessary heart and kidney doctors.     - It is highly recommended that people with diabetes quit smoking or stay away from smoking, and get yearly  flu vaccine and pneumonia vaccine at least every 5 years.  One other important lifestyle recommendation is to ensure adequate sleep - at least 6-7 hours of uninterrupted sleep at night.  -Exercise: If you are able: 30 -60 minutes a day, 4 days a week, or 150 minutes a week.  The longer the better.  Combine stretch, strength, and aerobic activities.  If you were told in the past that you have high risk for cardiovascular  diseases, you may seek evaluation by your heart doctor prior to initiating moderate to intense exercise programs.          

## 2019-07-26 ENCOUNTER — Other Ambulatory Visit: Payer: Self-pay | Admitting: "Endocrinology

## 2019-07-27 DIAGNOSIS — G4733 Obstructive sleep apnea (adult) (pediatric): Secondary | ICD-10-CM | POA: Diagnosis not present

## 2019-07-27 DIAGNOSIS — G4730 Sleep apnea, unspecified: Secondary | ICD-10-CM | POA: Diagnosis not present

## 2019-08-24 ENCOUNTER — Ambulatory Visit: Payer: Self-pay | Attending: Internal Medicine

## 2019-08-24 DIAGNOSIS — Z23 Encounter for immunization: Secondary | ICD-10-CM

## 2019-08-24 NOTE — Progress Notes (Signed)
   Covid-19 Vaccination Clinic  Name:  Joel Ward    MRN: [id number] DOB: 08-Mar-1966  08/24/2019  Joel Ward was observed post Covid-19 immunization for 15 minutes without incident. He was provided with Vaccine Information Sheet and instruction to access the V-Safe system.   Joel Ward was instructed to call 911 with any severe reactions post vaccine: [name] Difficulty breathing  . Swelling of face and throat  . A fast heartbeat  . A bad rash all over body  . Dizziness and weakness   Immunizations Administered    Name Date Dose VIS Date Route   Moderna COVID-19 Vaccine 08/24/2019  8:56 AM 0.5 mL 05/02/2019 Intramuscular   Manufacturer: Gala Murdoch   Lot: [id number]   NDC: 00349-179-15
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
## 2019-09-01 ENCOUNTER — Encounter: Payer: Self-pay | Admitting: Cardiovascular Disease

## 2019-09-01 ENCOUNTER — Ambulatory Visit: Payer: BC Managed Care – PPO | Admitting: Cardiovascular Disease

## 2019-09-01 VITALS — BP 122/68 | HR 83 | Temp 97.7°F | Ht 71.0 in | Wt 312.0 lb

## 2019-09-01 DIAGNOSIS — I2510 Atherosclerotic heart disease of native coronary artery without angina pectoris: Secondary | ICD-10-CM

## 2019-09-01 NOTE — Patient Instructions (Signed)

## 2019-09-25 ENCOUNTER — Other Ambulatory Visit: Payer: Self-pay | Admitting: "Endocrinology

## 2019-09-27 ENCOUNTER — Ambulatory Visit: Payer: BC Managed Care – PPO | Attending: Internal Medicine

## 2019-09-27 DIAGNOSIS — Z23 Encounter for immunization: Secondary | ICD-10-CM

## 2019-09-27 NOTE — Progress Notes (Signed)
   Covid-19 Vaccination Clinic  Name:  Joel Ward    MRN: [id number] DOB: [birth year]  09/27/2019  [name] was observed post Covid-19 immunization for 30 minutes based on pre-vaccination screening without incident. He was provided with Vaccine Information Sheet and instruction to access the V-Safe system.   [name] was instructed to call 911 with any severe reactions post vaccine: [name] Difficulty breathing  . Swelling of face and throat  . A fast heartbeat  . A bad rash all over body  . Dizziness and weakness   Immunizations Administered    Name Date Dose VIS Date Route   Moderna COVID-19 Vaccine 09/27/2019  8:33 AM 0.5 mL 05/2019 Intramuscular   Manufacturer: Moderna   Lot: [id number]   NDC: 42767-011-00

## 2019-10-04 ENCOUNTER — Other Ambulatory Visit: Payer: Self-pay

## 2019-10-04 MED ORDER — GLIPIZIDE ER 5 MG PO TB24: 5.0000 mg | ORAL_TABLET | Freq: Every day | ORAL | 0 refills | Status: DC

## 2019-10-12 DIAGNOSIS — E559 Vitamin D deficiency, unspecified: Secondary | ICD-10-CM | POA: Diagnosis not present

## 2019-10-12 DIAGNOSIS — E782 Mixed hyperlipidemia: Secondary | ICD-10-CM | POA: Diagnosis not present

## 2019-10-12 DIAGNOSIS — E1159 Type 2 diabetes mellitus with other circulatory complications: Secondary | ICD-10-CM | POA: Diagnosis not present

## 2019-10-13 LAB — COMPLETE METABOLIC PANEL WITH GFR
AG Ratio: 1.5 (calc) (ref 1.0–2.5)
ALT: 25 U/L (ref 9–46)
AST: 19 U/L (ref 10–35)
Albumin: 4.6 g/dL (ref 3.6–5.1)
Alkaline phosphatase (APISO): 79 U/L (ref 35–144)
BUN: 13 mg/dL (ref 7–25)
CO2: 32 mmol/L (ref 20–32)
Calcium: 9.7 mg/dL (ref 8.6–10.3)
Chloride: 101 mmol/L (ref 98–110)
Creat: 0.93 mg/dL (ref 0.70–1.33)
GFR, Est African American: 110 mL/min/{1.73_m2} (ref >=60)
GFR, Est Non African American: 95 mL/min/{1.73_m2} (ref >=60)
Globulin: 3.1 g/dL (calc) (ref 1.9–3.7)
Glucose, Bld: 99 mg/dL (ref 65–99)
Potassium: 3.9 mmol/L (ref 3.5–5.3)
Sodium: 138 mmol/L (ref 135–146)
Total Bilirubin: 0.4 mg/dL (ref 0.2–1.2)
Total Protein: 7.7 g/dL (ref 6.1–8.1)

## 2019-10-13 LAB — VITAMIN D 25 HYDROXY (VIT D DEFICIENCY, FRACTURES): Vit D, 25-Hydroxy: 26 ng/mL — ABNORMAL LOW (ref 30–100)

## 2019-10-13 LAB — LIPID PANEL
Cholesterol: 139 mg/dL (ref <200)
HDL: 47 mg/dL (ref >=40)
LDL Cholesterol (Calc): 64 mg/dL (calc)
Non-HDL Cholesterol (Calc): 92 mg/dL (calc) (ref <130)
Total CHOL/HDL Ratio: 3 (calc) (ref <5.0)
Triglycerides: 225 mg/dL — ABNORMAL HIGH (ref <150)

## 2019-10-13 LAB — MICROALBUMIN / CREATININE URINE RATIO
Creatinine, Urine: 90 mg/dL (ref 20–320)
Microalb Creat Ratio: 20 mcg/mg creat (ref <30)
Microalb, Ur: 1.8 mg/dL

## 2019-10-13 LAB — T4, FREE: Free T4: 1 ng/dL (ref 0.8–1.8)

## 2019-10-13 LAB — TSH: TSH: 3.36 mIU/L (ref 0.40–4.50)

## 2019-10-16 ENCOUNTER — Other Ambulatory Visit: Payer: Self-pay

## 2019-10-16 ENCOUNTER — Ambulatory Visit: Payer: BC Managed Care – PPO | Admitting: "Endocrinology

## 2019-10-16 ENCOUNTER — Encounter: Payer: Self-pay | Admitting: "Endocrinology

## 2019-10-16 VITALS — BP 121/82 | HR 81 | Ht 72.0 in | Wt 311.2 lb

## 2019-10-16 DIAGNOSIS — E782 Mixed hyperlipidemia: Secondary | ICD-10-CM

## 2019-10-16 DIAGNOSIS — E559 Vitamin D deficiency, unspecified: Secondary | ICD-10-CM | POA: Diagnosis not present

## 2019-10-16 DIAGNOSIS — I10 Essential (primary) hypertension: Secondary | ICD-10-CM | POA: Diagnosis not present

## 2019-10-16 DIAGNOSIS — E1159 Type 2 diabetes mellitus with other circulatory complications: Secondary | ICD-10-CM | POA: Diagnosis not present

## 2019-10-16 DIAGNOSIS — E119 Type 2 diabetes mellitus without complications: Secondary | ICD-10-CM | POA: Diagnosis not present

## 2019-10-16 LAB — POCT GLYCOSYLATED HEMOGLOBIN (HGB A1C): Hemoglobin A1C: 7 % — AB (ref 4.0–5.6)

## 2019-10-16 MED ORDER — VITAMIN D3 125 MCG (5000 UT) PO CAPS: 5000.0000 [IU] | ORAL_CAPSULE | Freq: Every day | ORAL | 0 refills | Status: DC

## 2019-10-16 MED ORDER — TOUJEO SOLOSTAR 300 UNIT/ML ~~LOC~~ SOPN: PEN_INJECTOR | SUBCUTANEOUS | 0 refills | Status: DC

## 2019-10-16 NOTE — Progress Notes (Signed)
10/16/2019   Endocrinology follow-up note    Subjective:    [name], male    DOB: [birth year].  he is being seen in follow-up after he was seen in consultation for  management of currently uncontrolled symptomatic uncontrolled type 2 diabetes, hyperlipidemia, hypertension.  PMD:   Redmond School, MD.   Past Medical History:  Diagnosis Date  . CAD (coronary artery disease), native coronary artery    10/18 PCI/DES mLAD, normal EF on LV gram  . Diabetes mellitus    Type 2 diagnosed 2 weeks ago  . Hiatal hernia with gastroesophageal reflux   . Hypertension   . Mixed hyperlipidemia   . Morbid obesity ([place])   . OSA on CPAP   . Urinary frequency    Past Surgical History:  Procedure Laterality Date  . CIRCUMCISION     1988  . CORONARY STENT INTERVENTION N/A 03/16/2017   Procedure: CORONARY STENT INTERVENTION;  Surgeon: Belva Crome, MD;  Location: [facility];  Service: Cardiovascular;  Laterality: N/A;  . ESOPHAGEAL DILATION N/A 02/08/2015   Procedure: ESOPHAGEAL DILATION;  Surgeon: Rogene Houston, MD;  [facility];  Service: Endoscopy;  Laterality: N/A;  . ESOPHAGOGASTRODUODENOSCOPY N/A 02/08/2015   Procedure: ESOPHAGOGASTRODUODENOSCOPY (EGD);  Surgeon: Rogene Houston, MD;  [facility]: [facility];  Service: Endoscopy;  Laterality: N/A;  1055  . ESOPHAGOGASTRODUODENOSCOPY (EGD) WITH ESOPHAGEAL DILATION  03/29/2012   Procedure: ESOPHAGOGASTRODUODENOSCOPY (EGD) WITH ESOPHAGEAL DILATION;  Surgeon: Rogene Houston, MD;  [facility];  Service: Endoscopy;  Laterality: N/A;  730  . LEFT HEART CATH AND CORONARY ANGIOGRAPHY N/A 03/16/2017   Procedure: LEFT HEART CATH AND CORONARY ANGIOGRAPHY;  Surgeon: Belva Crome, MD;  Location: [facility];  Service: Cardiovascular;  Laterality: N/A;  . ULTRASOUND GUIDANCE FOR VASCULAR ACCESS  03/16/2017   Procedure: Ultrasound Guidance For Vascular Access;  Surgeon: Belva Crome, MD;   Location: [facility];  Service: Cardiovascular;;   Social History   Socioeconomic History  . Marital status: Married    Spouse name: Not on file  . Number of children: Not on file  . Years of education: Not on file  . Highest education level: Not on file  Occupational History  . Not on file  Tobacco Use  . Smoking status: Never Smoker  . Smokeless tobacco: Never Used  Substance and Sexual Activity  . Alcohol use: No  . Drug use: No  . Sexual activity: Not on file  Other Topics Concern  . Not on file  Social History Narrative   Drinks caffeine 4 times a week.      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   Social Determinants of Health   Financial Resource Strain:   . Difficulty of Paying Living Expenses:   Food Insecurity:   . Worried About Charity fundraiser in the Last Year:   . Arboriculturist in the Last Year:   Transportation Needs:   . Film/video editor (Medical):   [name] Lack of Transportation (Non-Medical):   Physical Activity:   . Days of Exercise per Week:   . Minutes of Exercise per Session:   Stress:   . Feeling of Stress :   Social Connections:   . Frequency of Communication with Friends and Family:   . Frequency of Social Gatherings with Friends and Family:   . Attends Religious Services:   . Active Member of Clubs or Organizations:   .  Attends Banker Meetings:   [name] Marital Status:    Outpatient Encounter Medications as of 10/16/2019  Medication Sig  . aspirin 81 MG chewable tablet Chew 1 tablet (81 mg total) by mouth daily.  [name] atorvastatin (LIPITOR) 40 MG tablet TAKE 1 TABLET BY MOUTH ONCE DAILY AT  6  PM  . B-D ULTRAFINE III SHORT PEN 31G X 8 MM MISC USE 1 PEN NEEDLE AS DIRECTED  . Cholecalciferol (VITAMIN D3) 125 MCG (5000 UT) CAPS Take 1 capsule (5,000 Units total) by mouth daily.  [name] glipiZIDE (GLUCOTROL XL) 5 MG 24 hr tablet Take 1 tablet (5 mg total) by mouth daily with breakfast.  . insulin glargine, 1 Unit Dial, (TOUJEO SOLOSTAR) 300  UNIT/ML Solostar Pen INJECT 40 UNITS SUBCUTANEOUSLY ONCE DAILY  . lisinopril (ZESTRIL) 10 MG tablet Take 1 tablet (10 mg total) by mouth daily.  . Melatonin 2.5 MG CAPS Take 2.5 mg by mouth at bedtime as needed (sleep).  . nitroGLYCERIN (NITROSTAT) 0.4 MG SL tablet Place 1 tablet (0.4 mg total) under the tongue every 5 (five) minutes as needed.  . sildenafil (VIAGRA) 100 MG tablet Take 100 mg by mouth as needed.  . SitaGLIPtin-MetFORMIN HCl (JANUMET XR) 50-1000 MG TB24 TAKE 1 TABLET BY MOUTH AFTER BREAKFAST  . [DISCONTINUED] TOUJEO SOLOSTAR 300 UNIT/ML Solostar Pen INJECT 50 UNITS SUBCUTANEOUSLY ONCE DAILY   No facility-administered encounter medications on file as of 10/16/2019.    ALLERGIES: Allergies  Allergen Reactions  . Bee Venom   . Shellfish Allergy Swelling    Turns red  . Strawberry Extract Rash    VACCINATION STATUS: Immunization History  Administered Date(s) Administered  . Moderna SARS-COVID-2 Vaccination 08/24/2019, 09/27/2019    Diabetes He presents for his follow-up diabetic visit. He has type 2 diabetes mellitus. Onset time: He reports that he was diagnosed at approximate age of 13 years. His disease course has been improving. There are no hypoglycemic associated symptoms. Pertinent negatives for hypoglycemia include no confusion, headaches, pallor, seizures or tremors. Pertinent negatives for diabetes include no blurred vision, no chest pain, no fatigue, no polydipsia, no polyphagia, no polyuria and no weakness. There are no hypoglycemic complications. Symptoms are improving. Diabetic complications include heart disease. Risk factors for coronary artery disease include dyslipidemia, diabetes mellitus, family history, hypertension, male sex, obesity and sedentary lifestyle. Current diabetic treatment includes oral agent (dual therapy). His weight is increasing steadily. He is following a diabetic diet. When asked about meal planning, he reported none. He has not had a  previous visit with a dietitian. He participates in exercise intermittently. His breakfast blood glucose range is generally 130-140 mg/dl. His lunch blood glucose range is generally 140-180 mg/dl. His dinner blood glucose range is generally 140-180 mg/dl. His bedtime blood glucose range is generally 140-180 mg/dl. His overall blood glucose range is 140-180 mg/dl. (He presents with significant improvement in his glycemic profile to near target levels both fasting and postprandial.  His point-of-care A1c is 7% improving from 11.8%.   ) An ACE inhibitor/angiotensin II receptor blocker is being taken. He does not see a podiatrist.Eye exam is not current.  Hyperlipidemia This is a chronic problem. The current episode started more than 1 year ago. The problem is controlled. Exacerbating diseases include diabetes and obesity. Pertinent negatives include no chest pain, myalgias or shortness of breath. Current antihyperlipidemic treatment includes statins. Risk factors for coronary artery disease include dyslipidemia, diabetes mellitus, hypertension, male sex, obesity, a sedentary lifestyle and family history.  Hypertension  This is a chronic problem. The problem is controlled. Pertinent negatives include no blurred vision, chest pain, headaches, neck pain, palpitations or shortness of breath. Risk factors for coronary artery disease include diabetes mellitus, dyslipidemia, obesity, male gender, sedentary lifestyle and family history. Past treatments include ACE inhibitors. Hypertensive end-organ damage includes CAD/MI.     Review of systems  Constitutional: + Progressive weight gain,  current  Body mass index is 42.21 kg/m. , no fatigue, no subjective hyperthermia, no subjective hypothermia Eyes: no blurry vision, no xerophthalmia ENT: no sore throat, no nodules palpated in throat, no dysphagia/odynophagia, no hoarseness Cardiovascular: no Chest Pain, no Shortness of Breath, no palpitations, no leg  swelling Respiratory: no cough, no shortness of breath Gastrointestinal: no Nausea/Vomiting/Diarhhea Musculoskeletal: no muscle/joint aches Skin: no rashes, no hyperemia Neurological: no tremors, no numbness, no tingling, no dizziness Psychiatric: no depression, no anxiety   Objective:    BP 121/82   Pulse 81   Ht 6' (1.829 m)   Wt (!) 311 lb 3.2 oz (141.2 kg)   BMI 42.21 kg/m   Wt Readings from Last 3 Encounters:  10/16/19 (!) 311 lb 3.2 oz (141.2 kg)  09/01/19 (!) 312 lb (141.5 kg)  07/07/19 (!) 303 lb 9.6 oz (137.7 kg)     Physical Exam- Limited  Constitutional:  Body mass index is 42.21 kg/m. , not in acute distress, normal state of mind Eyes:  EOMI, no exophthalmos Neck: Supple Thyroid: No gross goiter Respiratory: Adequate breathing efforts Musculoskeletal: no gross deformities, strength intact in all four extremities, no gross restriction of joint movements Skin:  no rashes, no hyperemia Neurological: no tremor with outstretched hands,    CMP ( most recent) CMP     Component Value Date/Time   NA 138 10/12/2019 0833   NA 140 03/22/2017 1549   K 3.9 10/12/2019 0833   CL 101 10/12/2019 0833   CO2 32 10/12/2019 0833   GLUCOSE 99 10/12/2019 0833   BUN 13 10/12/2019 0833   BUN 14 03/22/2017 1549   CREATININE 0.93 10/12/2019 0833   CALCIUM 9.7 10/12/2019 0833   PROT 7.7 10/12/2019 0833   PROT 8.3 03/22/2017 1549   ALBUMIN 4.2 06/29/2017 0807   ALBUMIN 4.6 03/22/2017 1549   AST 19 10/12/2019 0833   ALT 25 10/12/2019 0833   ALKPHOS 60 06/29/2017 0807   BILITOT 0.4 10/12/2019 0833   BILITOT 0.4 03/22/2017 1549   GFRNONAA 95 10/12/2019 0833   GFRAA 110 10/12/2019 0833     Diabetic Labs (most recent): Lab Results  Component Value Date   HGBA1C 7.0 (A) 10/16/2019   HGBA1C 11.8 (A) 07/07/2019   HGBA1C 8.2 (H) 01/31/2019     Lipid Panel ( most recent) Lipid Panel     Component Value Date/Time   CHOL 139 10/12/2019 0833   CHOL 151 03/22/2017 1549    TRIG 225 (H) 10/12/2019 0833   HDL 47 10/12/2019 0833   HDL 47 03/22/2017 1549   CHOLHDL 3.0 10/12/2019 0833   VLDL 21 06/29/2017 0807   LDLCALC 64 10/12/2019 0833     Assessment & Plan:   1. DM type 2 causing vascular disease ([facility]) - Patient has currently uncontrolled symptomatic type 2 DM since  54 years of age.  He presents with significant improvement in his glycemic profile to near target levels both fasting and postprandial.  His point-of-care A1c is 7% improving from 11.8%.      Recent labs reviewed with him showing normal renal function.  Is  found to have vitamin D deficiency.  -his diabetes is complicated by coronary artery disease with stent placement on 03/15/2017 and Arline AspFrederick J Durkin remains at extremely  high risk for more acute and chronic complications which include CAD, CVA, CKD, retinopathy, and neuropathy. These are all discussed in detail with the patient.  - I have counseled him on diet management and weight loss, by adopting a carbohydrate restricted/protein rich diet. -He did admits to dietary indiscretions including consumption of sweets and sweetened beverages.    - he  admits there is a room for improvement in his diet and drink choices. -  Suggestion is made for him to avoid simple carbohydrates  from his diet including Cakes, Sweet Desserts / Pastries, Ice Cream, Soda (diet and regular), Sweet Tea, Candies, Chips, Cookies, Sweet Pastries,  Store Bought Juices, Alcohol in Excess of  1-2 drinks a day, Artificial Sweeteners, Coffee Creamer, and "Sugar-free" Products. This will help patient to have stable blood glucose profile and potentially avoid unintended weight gain.  - I encouraged him to switch to  unprocessed or minimally processed complex starch and increased protein intake (animal or plant source), fruits, and vegetables.  - he is advised to stick to a routine mealtimes to eat 3 meals  a day and avoid unnecessary snacks ( to snack only to correct  hypoglycemia).   - he has been  scheduled with Norm SaltPenny Crumpton, RDN, CDE for individualized DM education- consult pending.  - I have approached him with the following individualized plan to manage diabetes and patient agrees:   -He is responding to his current regimen.  Based on his glycemic response, he will not need prandial insulin for now.  He is advised to lower his Toujeo to 40 units nightly, associated with monitoring of blood glucose twice a day-daily before breakfast and at bedtime.   -He is advised to continue Janumet 50/1000 mg ER once a day after breakfast. -He is encouraged to call clinic for hypoglycemia below 70 and hyperglycemia above 200 mg/dL. -He is benefiting from low-dose glipizide, he is advised to continue glipizide 5 mg XL p.o. daily at breakfast.  2) BP/HTN: -His blood pressure is controlled to target.   He is advised to continue his current blood pressure medications including lisinopril 10 mg p.o. daily at breakfast.    3) Lipids/HPL: His recent lipid panel showed controlled LDL at 64.  He is advised to continue atorvastatin 40 mg p.o. nightly.  Side effects and precautions discussed with him.    4)  Weight/Diet: His current BMI is 42.2-clearly complicating his diabetes care.  He is a candidate for modest weight loss.   I discussed with him the fact that he would benefit the most from loss of 5 - 10% of his current body weight .  To achieve his he has to stick to the dietary and exercise regimen provided to him and will benefit from bariatric surgery.     CDE Consult has been initiated , exercise, and detailed carbohydrates information provided.  He is given brochure on bariatric surgery.  5) vitamin D deficiency: He is on ongoing vitamin D treatment, 50,000 units of ergocalciferol weekly.    6) Chronic Care/Health Maintenance:  -he  is on ACEI/ARB and Statin medications and  is encouraged to continue to follow up with Ophthalmology, Dentist,  Podiatrist at least  yearly or according to recommendations, and advised to  stay away from smoking. I have recommended yearly flu vaccine and pneumonia vaccination at least every  5 years; moderate intensity exercise for up to 150 minutes weekly; and  sleep for at least 7 hours a day.  - I advised patient to maintain close follow up with Elfredia Nevins, MD for primary care needs.  - Time spent on this patient care encounter:  35 min, of which > 50% was spent in  counseling and the rest reviewing his blood glucose logs , discussing his hypoglycemia and hyperglycemia episodes, reviewing his current and  previous labs / studies  ( including abstraction from other facilities) and medications  doses and developing a  long term treatment plan and documenting his care.   Please refer to Patient Instructions for Blood Glucose Monitoring and Insulin/Medications Dosing Guide"  in media tab for additional information. Please  also refer to " Patient Self Inventory" in the Media  tab for reviewed elements of pertinent patient history.  Army Fossa Etherington participated in the discussions, expressed understanding, and voiced agreement with the above plans.  All questions were answered to his satisfaction. he is encouraged to contact clinic should he have any questions or concerns prior to his return visit.   Follow up plan: - Return in about 4 months (around 02/16/2020) for Bring Meter and Logs- A1c in Office.  Marquis Lunch, MD Phone: [phone]  Fax: [phone]   10/16/2019, 7:42 PM This note was partially dictated with voice recognition software. Similar sounding words can be transcribed inadequately or may not  be corrected upon review.

## 2019-10-16 NOTE — Patient Instructions (Signed)

## 2019-11-06 DIAGNOSIS — Z794 Long term (current) use of insulin: Secondary | ICD-10-CM | POA: Diagnosis not present

## 2019-11-06 DIAGNOSIS — E119 Type 2 diabetes mellitus without complications: Secondary | ICD-10-CM | POA: Diagnosis not present

## 2019-11-06 LAB — HM DIABETES EYE EXAM

## 2019-11-26 ENCOUNTER — Other Ambulatory Visit: Payer: Self-pay | Admitting: "Endocrinology

## 2020-01-09 ENCOUNTER — Other Ambulatory Visit: Payer: Self-pay | Admitting: "Endocrinology

## 2020-01-12 ENCOUNTER — Other Ambulatory Visit: Payer: Self-pay | Admitting: Student

## 2020-01-20 ENCOUNTER — Emergency Department (HOSPITAL_COMMUNITY)
Admission: EM | Admit: 2020-01-20 | Discharge: 2020-01-20 | Disposition: A | Discharge status: Home / Self Care | Payer: BC Managed Care – PPO | Attending: Emergency Medicine | Admitting: Emergency Medicine

## 2020-01-20 ENCOUNTER — Emergency Department (HOSPITAL_COMMUNITY): Payer: BC Managed Care – PPO

## 2020-01-20 ENCOUNTER — Other Ambulatory Visit: Payer: Self-pay

## 2020-01-20 DIAGNOSIS — R0789 Other chest pain: Secondary | ICD-10-CM | POA: Diagnosis not present

## 2020-01-20 DIAGNOSIS — Z7982 Long term (current) use of aspirin: Secondary | ICD-10-CM | POA: Diagnosis not present

## 2020-01-20 DIAGNOSIS — M25511 Pain in right shoulder: Secondary | ICD-10-CM | POA: Diagnosis not present

## 2020-01-20 DIAGNOSIS — Z79899 Other long term (current) drug therapy: Secondary | ICD-10-CM | POA: Diagnosis not present

## 2020-01-20 DIAGNOSIS — I10 Essential (primary) hypertension: Secondary | ICD-10-CM | POA: Diagnosis not present

## 2020-01-20 DIAGNOSIS — R079 Chest pain, unspecified: Secondary | ICD-10-CM | POA: Diagnosis not present

## 2020-01-20 DIAGNOSIS — E1151 Type 2 diabetes mellitus with diabetic peripheral angiopathy without gangrene: Secondary | ICD-10-CM | POA: Insufficient documentation

## 2020-01-20 DIAGNOSIS — I25110 Atherosclerotic heart disease of native coronary artery with unstable angina pectoris: Secondary | ICD-10-CM | POA: Diagnosis not present

## 2020-01-20 LAB — CBC
HCT: 39.5 % (ref 39.0–52.0)
Hemoglobin: 12.2 g/dL — ABNORMAL LOW (ref 13.0–17.0)
MCH: 26.7 pg (ref 26.0–34.0)
MCHC: 30.9 g/dL (ref 30.0–36.0)
MCV: 86.4 fL (ref 80.0–100.0)
Platelets: 245 10*3/uL (ref 150–400)
RBC: 4.57 MIL/uL (ref 4.22–5.81)
RDW: 14.8 % (ref 11.5–15.5)
WBC: 9.7 10*3/uL (ref 4.0–10.5)
nRBC: 0 % (ref 0.0–0.2)

## 2020-01-20 LAB — BASIC METABOLIC PANEL
Anion gap: 10 (ref 5–15)
BUN: 12 mg/dL (ref 6–20)
CO2: 26 mmol/L (ref 22–32)
Calcium: 9 mg/dL (ref 8.9–10.3)
Chloride: 100 mmol/L (ref 98–111)
Creatinine, Ser: 1.08 mg/dL (ref 0.61–1.24)
GFR calc Af Amer: >60 mL/min (ref >60)
GFR calc non Af Amer: >60 mL/min (ref >60)
Glucose, Bld: 404 mg/dL — ABNORMAL HIGH (ref 70–99)
Potassium: 3.9 mmol/L (ref 3.5–5.1)
Sodium: 136 mmol/L (ref 135–145)

## 2020-01-20 LAB — TROPONIN I (HIGH SENSITIVITY)
Troponin I (High Sensitivity): 4 ng/L (ref <18)
Troponin I (High Sensitivity): 4 ng/L (ref <18)

## 2020-01-20 MED ORDER — CYCLOBENZAPRINE HCL 10 MG PO TABS: 10.0000 mg | ORAL_TABLET | Freq: Two times a day (BID) | ORAL | 0 refills | Status: DC | PRN

## 2020-01-20 NOTE — Discharge Instructions (Signed)
Your blood work to your heart was normal today without signs of heart attack.  Your chest X-ray was normal without pneumonia or heart problems.  Your kidneys and white blood cell count were also normal.  No signs of heart attack on your EKG.  You most likely having issues with your shoulder causing the pain.  However if you develop severe shortness of breath, fever, passing out or pain with activity you need to be re-evaluated.

## 2020-01-20 NOTE — ED Triage Notes (Signed)
Per pt he was at work and started having chest pain that had been happening for 2 days. Pt said he has had a stent placed a few years ago. Pt said the pain is in the right side of his chest and goes through his chest to his back. pT SAID NO sob, NO nv

## 2020-01-20 NOTE — ED Notes (Signed)
Pt discharge instructions reviewed with the patient. The patient verbalized understanding of instructions. Pt discharged. 

## 2020-01-20 NOTE — ED Provider Notes (Signed)
[facility]   CSN: [id number] Arrival date & time: 01/20/20  0532     History Chief Complaint  Patient presents with  . Chest Pain    Joel Ward is a 54 y.o. male.  The history is provided by the patient.  Chest Pain Pain location:  R chest Pain quality: sharp and shooting   Pain radiates to:  R shoulder and neck Pain severity:  Moderate Onset quality:  Gradual Duration:  2 days Timing:  Constant Progression:  Worsening Chronicity:  New Context: breathing, movement, raising an arm and at rest   Relieved by: seems to be better with activity and NSAIDS. Exacerbated by: sitting and being still. Ineffective treatments:  None tried Associated symptoms: no abdominal pain, no anorexia, no cough, no diaphoresis, no dizziness, no fever, no lower extremity edema, no nausea, no near-syncope, no palpitations, no shortness of breath, no vomiting and no weakness   Risk factors: coronary artery disease, diabetes mellitus, high cholesterol, hypertension, male sex and obesity   Risk factors: no immobilization, no prior DVT/PE and no smoking   Risk factors comment:  Stent 2 years ago      Past Medical History:  Diagnosis Date  . CAD (coronary artery disease), native coronary artery    10/18 PCI/DES mLAD, normal EF on LV gram  . Diabetes mellitus    Type 2 diagnosed 2 weeks ago  . Hiatal hernia with gastroesophageal reflux   . Hypertension   . Mixed hyperlipidemia   . Morbid obesity (HCC)   . OSA on CPAP   . Urinary frequency     Patient Active Problem List   Diagnosis Date Noted  . Vitamin D deficiency 03/03/2018  . Multinodular goiter 07/08/2017  . Mixed hyperlipidemia 03/24/2017  . Essential hypertension, benign 03/24/2017  . Coronary artery disease involving native coronary artery of native heart with unstable angina pectoris (HCC) 03/16/2017  . Chest pain   . DOE (dyspnea on exertion) 03/15/2017  . Persistent  circadian rhythm sleep disorder, shift work type 01/22/2015  . Hypersomnia with sleep apnea 01/22/2015  . Snoring 01/22/2015  . Nocturia more than twice per night 01/22/2015  . Class 2 severe obesity due to excess calories with serious comorbidity and body mass index (BMI) of 36.0 to 36.9 in adult [facility](HCC) 01/22/2015  . DM type 2 causing vascular disease (HCC) 06/24/2011  . Dysphagia 06/24/2011    Past Surgical History:  Procedure Laterality Date  . CIRCUMCISION     1988  . CORONARY STENT INTERVENTION N/A 03/16/2017   Procedure: CORONARY STENT INTERVENTION;  Surgeon: Lyn RecordsSmith, Henry W, MD;  [facility];  Service: Cardiovascular;  Laterality: N/A;  . ESOPHAGEAL DILATION N/A 02/08/2015   Procedure: ESOPHAGEAL DILATION;  Surgeon: Malissa HippoNajeeb U [name], MD;  Location: [facility] ENDO SUITE;  Service: Endoscopy;  Laterality: N/A;  . ESOPHAGOGASTRODUODENOSCOPY N/A 02/08/2015   Procedure: ESOPHAGOGASTRODUODENOSCOPY (EGD);  Surgeon: Malissa HippoNajeeb U [name], MD;  Location: [facility] ENDO SUITE;  Service: Endoscopy;  Laterality: N/A;  1055  . ESOPHAGOGASTRODUODENOSCOPY (EGD) WITH ESOPHAGEAL DILATION  03/29/2012   Procedure: ESOPHAGOGASTRODUODENOSCOPY (EGD) WITH ESOPHAGEAL DILATION;  Surgeon: Malissa HippoNajeeb U [name], MD;  Location: [facility] ENDO SUITE;  Service: Endoscopy;  Laterality: N/A;  730  . LEFT HEART CATH AND CORONARY ANGIOGRAPHY N/A 03/16/2017   Procedure: LEFT HEART CATH AND CORONARY ANGIOGRAPHY;  Surgeon: Lyn RecordsSmith, Henry W, MD;  Location: [facility] INVASIVE CV LAB;  Service: Cardiovascular;  Laterality: N/A;  . ULTRASOUND GUIDANCE FOR VASCULAR  ACCESS  03/16/2017   Procedure: Ultrasound Guidance For Vascular Access;  Surgeon: Lyn Records, MD;  Location: [facility] INVASIVE CV LAB;  Service: Cardiovascular;;       Family History  Problem Relation Age of Onset  . Heart disease Mother   . Kidney failure Mother   . Diabetes Mother   . Diabetes Father   . Thyroid disease Neg Hx     Social History   Tobacco Use  . Smoking status:  Never Smoker  . Smokeless tobacco: Never Used  Vaping Use  . Vaping Use: Never used  Substance Use Topics  . Alcohol use: No  . Drug use: No    Home Medications Prior to Admission medications   Medication Sig Start Date End Date Taking? Authorizing Provider  aspirin 81 MG chewable tablet Chew 1 tablet (81 mg total) by mouth daily. 03/17/17   Arty Baumgartner, NP  atorvastatin (LIPITOR) 40 MG tablet TAKE 1 TABLET BY MOUTH ONCE DAILY AT  6  PM 09/30/18   Wendall Stade, MD  B-D ULTRAFINE III SHORT PEN 31G X 8 MM MISC USE 1 PEN NEEDLE AS DIRECTED 07/26/19   Roma Kayser, MD  Cholecalciferol (VITAMIN D3) 125 MCG (5000 UT) CAPS Take 1 capsule (5,000 Units total) by mouth daily. 10/16/19   Roma Kayser, MD  cyclobenzaprine (FLEXERIL) 10 MG tablet Take 1 tablet (10 mg total) by mouth 2 (two) times daily as needed for muscle spasms. 01/20/20   Gwyneth Sprout, MD  glipiZIDE (GLUCOTROL XL) 5 MG 24 hr tablet Take 1 tablet (5 mg total) by mouth daily with breakfast. 10/04/19   Roma Kayser, MD  JANUMET XR 50-1000 MG TB24 TAKE 1 TABLET BY MOUTH AFTER BREAKFAST 01/09/20   Roma Kayser, MD  lisinopril (ZESTRIL) 10 MG tablet Take 1 tablet by mouth once daily 01/12/20   [place] Ouch, Lennart Pall, PA-C  Melatonin 2.5 MG CAPS Take 2.5 mg by mouth at bedtime as needed (sleep).    [provider]  nitroGLYCERIN (NITROSTAT) 0.4 MG SL tablet Place 1 tablet (0.4 mg total) under the tongue every 5 (five) minutes as needed. 03/16/17   Arty Baumgartner, NP  sildenafil (VIAGRA) 100 MG tablet Take 100 mg by mouth as needed. 03/20/17   [provider]  TOUJEO SOLOSTAR 300 UNIT/ML Solostar Pen INJECT 40 UNITS SUBCUTANEOUSLY ONCE DAILY 11/27/19   Roma Kayser, MD    Allergies    Bee venom, Shellfish allergy, and Strawberry extract  Review of Systems   Review of Systems  Constitutional: Negative for diaphoresis and fever.  Respiratory: Negative for cough and  shortness of breath.   Cardiovascular: Positive for chest pain. Negative for palpitations and near-syncope.  Gastrointestinal: Negative for abdominal pain, anorexia, nausea and vomiting.  Neurological: Negative for dizziness and weakness.  All other systems reviewed and are negative.   Physical Exam Updated Vital Signs BP (!) 156/89 (BP Location: Left Arm)   Pulse 82   Temp 98.9 F (37.2 C) (Oral)   Resp 16   SpO2 93%   Physical Exam Vitals and nursing note reviewed.  Constitutional:      General: He is not in acute distress.    Appearance: He is well-developed. He is obese.  HENT:     Head: Normocephalic and atraumatic.     Mouth/Throat:     Mouth: Mucous membranes are moist.  Eyes:     Conjunctiva/sclera: Conjunctivae normal.     Pupils: Pupils are equal,  round, and reactive to light.  Cardiovascular:     Rate and Rhythm: Normal rate and regular rhythm.     Pulses: Normal pulses.     Heart sounds: No murmur heard.   Pulmonary:     Effort: Pulmonary effort is normal. No respiratory distress.     Breath sounds: Normal breath sounds. No wheezing or rales.  Chest:     Chest wall: Tenderness present.    Abdominal:     General: There is no distension.     Palpations: Abdomen is soft.     Tenderness: There is no abdominal tenderness. There is no guarding or rebound.  Musculoskeletal:        General: Tenderness present. Normal range of motion.     Right shoulder: Tenderness and bony tenderness present. Normal range of motion.     Left shoulder: Normal.       Arms:     Cervical back: Normal range of motion and neck supple.     Right lower leg: No edema.     Left lower leg: No edema.  Skin:    General: Skin is warm and dry.     Capillary Refill: Capillary refill takes less than 2 seconds.     Findings: No erythema or rash.  Neurological:     General: No focal deficit present.     Mental Status: He is alert and oriented to person, place, and time. Mental status is at  baseline.  Psychiatric:        Mood and Affect: Mood normal.        Behavior: Behavior normal.        Thought Content: Thought content normal.      ED Results / Procedures / Treatments   Labs (all labs ordered are listed, but only abnormal results are displayed) Labs Reviewed  BASIC METABOLIC PANEL - Abnormal; Notable for the following components:      Result Value   Glucose, Bld 404 (*)    All other components within normal limits  CBC - Abnormal; Notable for the following components:   Hemoglobin 12.2 (*)    All other components within normal limits  TROPONIN I (HIGH SENSITIVITY)  TROPONIN I (HIGH SENSITIVITY)    EKG EKG Interpretation  Date/Time:  Saturday January 20 2020 05:50:33 EDT Ventricular Rate:  82 PR Interval:  168 QRS Duration: 98 QT Interval:  394 QTC Calculation: 460 R Axis:   84 Text Interpretation: Normal sinus rhythm Normal ECG Confirmed by Eber Hong ([id number]) on 01/20/2020 12:59:08 PM   Radiology DG Chest 2 View  Result Date: 01/20/2020 CLINICAL DATA:  Chest pain EXAM: CHEST - 2 VIEW COMPARISON:  03/17/2018 FINDINGS: The heart size and mediastinal contours are within normal limits. Both lungs are clear. The visualized skeletal structures are unremarkable. IMPRESSION: No active cardiopulmonary disease. Electronically Signed   By: Deatra Robinson M.D.   On: 01/20/2020 06:31    Procedures Procedures (including critical care time)  Medications Ordered in ED Medications - No data to display  ED Course  I have reviewed the triage vital signs and the nursing notes.  Pertinent labs & imaging results that were available during my care of the patient were reviewed by me and considered in my medical decision making (see chart for details).    MDM Rules/Calculators/A&P                          Patient is a 54 year old male with  multiple cardiac risk factors who is a heart score of 3 presenting with right-sided chest pain today that is not classic for  ACS.  Patient's pain is reproduced with deep breathing, moving his right shoulder and when he lifts his head.  It is worse at rest and better with activity.  Patient has not had associated symptoms such as shortness of breath, fever, cough, nausea, vomiting or diaphoresis.  The pain is not exertional.  Patient does have prior history of stent but describes symptoms that are quite different than the ones he is experiencing today.  Patient is mildly hypertensive here but otherwise has reassuring vital signs.  Patient's EKG is normal today, delta troponin is 4 and 4 and labs except for hyperglycemia are all unchanged.  Patient reports that his blood sugar is usually more controlled but he has been waiting for approximately 9 hours and there is nothing in the lobby that is appropriate for diabetic patients.  Chest x-ray today is also within normal limits.  Patient has had prior history of rotator cuff injury but cannot think of any specific trauma.  Suspect this is musculoskeletal in nature his pain is reproduced with palpation over the right [facility] joint and movement of the arm and palpation of the chest.  Patient is low risk for PE and low suspicion at this time.  Also low suspicion for dissection or GI pathology.  Patient was sent home with supportive care for musculoskeletal issues.  He had been taking NSAIDs at home but will try muscle relaxer.  Also given return precautions.  MDM Number of Diagnoses or Management Options   Amount and/or Complexity of Data Reviewed Clinical lab tests: ordered and reviewed Tests in the radiology section of CPT: ordered and reviewed Tests in the medicine section of CPT: reviewed and ordered Obtain history from someone other than the patient: no Review and summarize past medical records: yes Independent visualization of images, tracings, or specimens: yes  Risk of Complications, Morbidity, and/or Mortality Presenting problems: moderate Diagnostic procedures:  minimal Management options: minimal  Patient Progress Patient progress: stable  Final Clinical Impression(s) / ED Diagnoses Final diagnoses:  Acute chest wall pain  Acute pain of right shoulder    Rx / DC Orders ED Discharge Orders         Ordered    cyclobenzaprine (FLEXERIL) 10 MG tablet  2 times daily PRN        01/20/20 1523           Gwyneth Sprout, MD 01/20/20 1542

## 2020-02-17 ENCOUNTER — Other Ambulatory Visit: Payer: Self-pay | Admitting: "Endocrinology

## 2020-02-20 ENCOUNTER — Other Ambulatory Visit: Payer: Self-pay

## 2020-02-20 ENCOUNTER — Encounter: Payer: Self-pay | Admitting: "Endocrinology

## 2020-02-20 ENCOUNTER — Ambulatory Visit (INDEPENDENT_AMBULATORY_CARE_PROVIDER_SITE_OTHER): Payer: BC Managed Care – PPO | Admitting: "Endocrinology

## 2020-02-20 VITALS — BP 128/88 | HR 92 | Ht 72.0 in | Wt 304.2 lb

## 2020-02-20 DIAGNOSIS — E782 Mixed hyperlipidemia: Secondary | ICD-10-CM | POA: Diagnosis not present

## 2020-02-20 DIAGNOSIS — E1159 Type 2 diabetes mellitus with other circulatory complications: Secondary | ICD-10-CM | POA: Diagnosis not present

## 2020-02-20 DIAGNOSIS — I10 Essential (primary) hypertension: Secondary | ICD-10-CM | POA: Diagnosis not present

## 2020-02-20 DIAGNOSIS — E559 Vitamin D deficiency, unspecified: Secondary | ICD-10-CM | POA: Diagnosis not present

## 2020-02-20 LAB — POCT GLYCOSYLATED HEMOGLOBIN (HGB A1C): Hemoglobin A1C: 10 % — AB (ref 4.0–5.6)

## 2020-02-20 MED ORDER — BD PEN NEEDLE SHORT U/F 31G X 8 MM MISC
1 refills | Status: DC
Start: 2020-02-20 — End: 2020-09-17

## 2020-02-20 MED ORDER — GLIPIZIDE ER 5 MG PO TB24
ORAL_TABLET | ORAL | 1 refills | Status: DC
Start: 2020-02-20 — End: 2020-09-09

## 2020-02-20 MED ORDER — TOUJEO SOLOSTAR 300 UNIT/ML ~~LOC~~ SOPN
50.0000 [IU] | PEN_INJECTOR | Freq: Every day | SUBCUTANEOUS | 2 refills | Status: DC
Start: 2020-02-20 — End: 2020-09-09

## 2020-02-20 NOTE — Progress Notes (Signed)
02/20/2020   Endocrinology follow-up note    Subjective:    [name], male    DOB: [birth year].  he is being seen in follow-up after he was seen in consultation for  management of currently uncontrolled symptomatic uncontrolled type 2 diabetes, hyperlipidemia, hypertension.  PMD:   Elfredia NevinsFusco, Lawrence, MD.   Past Medical History:  Diagnosis Date  . CAD (coronary artery disease), native coronary artery    10/18 PCI/DES mLAD, normal EF on LV gram  . Diabetes mellitus    Type 2 diagnosed 2 weeks ago  . Hiatal hernia with gastroesophageal reflux   . Hypertension   . Mixed hyperlipidemia   . Morbid obesity (HCC)   . OSA on CPAP   . Urinary frequency    Past Surgical History:  Procedure Laterality Date  . CIRCUMCISION     1988  . CORONARY STENT INTERVENTION N/A 03/16/2017   Procedure: CORONARY STENT INTERVENTION;  Surgeon: Lyn RecordsSmith, Henry W, MD;  [facility];  Service: Cardiovascular;  Laterality: N/A;  . ESOPHAGEAL DILATION N/A 02/08/2015   Procedure: ESOPHAGEAL DILATION;  Surgeon: Malissa HippoNajeeb U [name], MD;  Location: [facility];  Service: Endoscopy;  Laterality: N/A;  . ESOPHAGOGASTRODUODENOSCOPY N/A 02/08/2015   Procedure: ESOPHAGOGASTRODUODENOSCOPY (EGD);  Surgeon: Malissa HippoNajeeb U [name], MD;  Location: [facility];  Service: Endoscopy;  Laterality: N/A;  1055  . ESOPHAGOGASTRODUODENOSCOPY (EGD) WITH ESOPHAGEAL DILATION  03/29/2012   Procedure: ESOPHAGOGASTRODUODENOSCOPY (EGD) WITH ESOPHAGEAL DILATION;  Surgeon: Malissa HippoNajeeb U [name], MD;  Location: [facility];  Service: Endoscopy;  Laterality: N/A;  730  . LEFT HEART CATH AND CORONARY ANGIOGRAPHY N/A 03/16/2017   Procedure: LEFT HEART CATH AND CORONARY ANGIOGRAPHY;  Surgeon: Lyn RecordsSmith, Henry W, MD;  Location: [facility];  Service: Cardiovascular;  Laterality: N/A;  . ULTRASOUND GUIDANCE FOR VASCULAR ACCESS  03/16/2017   Procedure: Ultrasound Guidance For Vascular Access;  Surgeon: Lyn RecordsSmith, Henry W, MD;   [facility];  Service: Cardiovascular;;   Social History   Socioeconomic History  . Marital status: Married    Spouse name: Not on file  . Number of children: Not on file  . Years of education: Not on file  . Highest education level: Not on file  Occupational History  . Not on file  Tobacco Use  . Smoking status: Never Smoker  . Smokeless tobacco: Never Used  Vaping Use  . Vaping Use: Never used  Substance and Sexual Activity  . Alcohol use: No  . Drug use: No  . Sexual activity: Not on file  Other Topics Concern  . Not on file  Social History Narrative   Drinks caffeine 4 times a week.      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   Social Determinants of Health   Financial Resource Strain:   . Difficulty of Paying Living Expenses: Not on file  Food Insecurity:   . Worried About Programme researcher, broadcasting/film/videounning Out of Food in the Last Year: Not on file  . Ran Out of Food in the Last Year: Not on file  Transportation Needs:   . Lack of Transportation (Medical): Not on file  . Lack of Transportation (Non-Medical): Not on file  Physical Activity:   . Days of Exercise per Week: Not on file  . Minutes of Exercise per Session: Not on file  Stress:   . Feeling of Stress : Not on file  Social Connections:   . Frequency of Communication with Friends and Family: Not on  file  . Frequency of Social Gatherings with Friends and Family: Not on file  . Attends Religious Services: Not on file  . Active Member of Clubs or Organizations: Not on file  . Attends Banker Meetings: Not on file  . Marital Status: Not on file   Outpatient Encounter Medications as of 02/20/2020  Medication Sig  . aspirin 81 MG chewable tablet Chew 1 tablet (81 mg total) by mouth daily.  [name] atorvastatin (LIPITOR) 40 MG tablet TAKE 1 TABLET BY MOUTH ONCE DAILY AT  6  PM  . Cholecalciferol (VITAMIN D3) 125 MCG (5000 UT) CAPS Take 1 capsule (5,000 Units total) by mouth daily.  . cyclobenzaprine (FLEXERIL) 10 MG tablet  Take 1 tablet (10 mg total) by mouth 2 (two) times daily as needed for muscle spasms.  [name] glipiZIDE (GLUCOTROL XL) 5 MG 24 hr tablet Take 1 tablet by mouth once daily with breakfast  . insulin glargine, 1 Unit Dial, (TOUJEO SOLOSTAR) 300 UNIT/ML Solostar Pen Inject 50 Units into the skin at bedtime.  . Insulin Pen Needle (B-D ULTRAFINE III SHORT PEN) 31G X 8 MM MISC USE 1 PEN NEEDLE AS DIRECTED  . JANUMET XR 50-1000 MG TB24 TAKE 1 TABLET BY MOUTH AFTER BREAKFAST  . lisinopril (ZESTRIL) 10 MG tablet Take 1 tablet by mouth once daily  . Melatonin 2.5 MG CAPS Take 2.5 mg by mouth at bedtime as needed (sleep).  . nitroGLYCERIN (NITROSTAT) 0.4 MG SL tablet Place 1 tablet (0.4 mg total) under the tongue every 5 (five) minutes as needed.  . sildenafil (VIAGRA) 100 MG tablet Take 100 mg by mouth as needed.  . [DISCONTINUED] B-D ULTRAFINE III SHORT PEN 31G X 8 MM MISC USE 1 PEN NEEDLE AS DIRECTED  . [DISCONTINUED] glipiZIDE (GLUCOTROL XL) 5 MG 24 hr tablet Take 1 tablet by mouth once daily with breakfast  . [DISCONTINUED] TOUJEO SOLOSTAR 300 UNIT/ML Solostar Pen INJECT 40 UNITS SUBCUTANEOUSLY ONCE DAILY   No facility-administered encounter medications on file as of 02/20/2020.    ALLERGIES: Allergies  Allergen Reactions  . Bee Venom   . Shellfish Allergy Swelling    Turns red  . Strawberry Extract Rash    VACCINATION STATUS: Immunization History  Administered Date(s) Administered  . Moderna SARS-COVID-2 Vaccination 08/24/2019, 09/27/2019    Diabetes He presents for his follow-up diabetic visit. He has type 2 diabetes mellitus. Onset time: He reports that he was diagnosed at approximate age of 54 years. His disease course has been worsening. There are no hypoglycemic associated symptoms. Pertinent negatives for hypoglycemia include no confusion, headaches, pallor, seizures or tremors. Associated symptoms include polydipsia and polyuria. Pertinent negatives for diabetes include no blurred vision,  no chest pain, no fatigue, no polyphagia and no weakness. There are no hypoglycemic complications. Symptoms are worsening. Diabetic complications include heart disease. Risk factors for coronary artery disease include dyslipidemia, diabetes mellitus, family history, hypertension, male sex, obesity and sedentary lifestyle. Current diabetic treatment includes oral agent (dual therapy). His weight is fluctuating minimally. He is following a generally unhealthy diet. When asked about meal planning, he reported none. He has not had a previous visit with a dietitian. He participates in exercise intermittently. (He did not bring any logs nor meter with him.  His point-of-care A1c is 10% increasing from 7%.   ) An ACE inhibitor/angiotensin II receptor blocker is being taken. He does not see a podiatrist.Eye exam is not current.  Hyperlipidemia This is a chronic problem. The current episode started  more than 1 year ago. The problem is controlled. Exacerbating diseases include diabetes and obesity. Pertinent negatives include no chest pain, myalgias or shortness of breath. Current antihyperlipidemic treatment includes statins. Risk factors for coronary artery disease include dyslipidemia, diabetes mellitus, hypertension, male sex, obesity, a sedentary lifestyle and family history.  Hypertension This is a chronic problem. The problem is controlled. Pertinent negatives include no blurred vision, chest pain, headaches, neck pain, palpitations or shortness of breath. Risk factors for coronary artery disease include diabetes mellitus, dyslipidemia, obesity, male gender, sedentary lifestyle and family history. Past treatments include ACE inhibitors. Hypertensive end-organ damage includes CAD/MI.     Review of systems  Constitutional: + Minimally fluctuating body weight,   current  Body mass index is 41.26 kg/m. , no fatigue, no subjective hyperthermia, no subjective hypothermia Eyes: no blurry vision, no  xerophthalmia ENT: no sore throat, no nodules palpated in throat, no dysphagia/odynophagia, no hoarseness Cardiovascular: no Chest Pain, no Shortness of Breath, no palpitations, no leg swelling Respiratory: no cough, no shortness of breath Gastrointestinal: no Nausea/Vomiting/Diarhhea Musculoskeletal: no muscle/joint aches Skin: no rashes, no hyperemia Neurological: no tremors, no numbness, no tingling, no dizziness Psychiatric: no depression, no anxiety   Objective:    BP 128/88   Pulse 92   Ht 6' (1.829 m)   Wt (!) 304 lb 3.2 oz (138 kg)   BMI 41.26 kg/m   Wt Readings from Last 3 Encounters:  02/20/20 (!) 304 lb 3.2 oz (138 kg)  10/16/19 (!) 311 lb 3.2 oz (141.2 kg)  09/01/19 (!) 312 lb (141.5 kg)     Physical Exam- Limited  Constitutional:  Body mass index is 41.26 kg/m. , not in acute distress, normal state of mind Eyes:  EOMI, no exophthalmos Neck: Supple Thyroid: No gross goiter Respiratory: Adequate breathing efforts Musculoskeletal: no gross deformities, strength intact in all four extremities, no gross restriction of joint movements Skin:  no rashes, no hyperemia Neurological: no tremor with outstretched hands   CMP ( most recent) CMP     Component Value Date/Time   NA 136 01/20/2020 0556   NA 140 03/22/2017 1549   K 3.9 01/20/2020 0556   CL 100 01/20/2020 0556   CO2 26 01/20/2020 0556   GLUCOSE 404 (H) 01/20/2020 0556   BUN 12 01/20/2020 0556   BUN 14 03/22/2017 1549   CREATININE 1.08 01/20/2020 0556   CREATININE 0.93 10/12/2019 0833   CALCIUM 9.0 01/20/2020 0556   PROT 7.7 10/12/2019 0833   PROT 8.3 03/22/2017 1549   ALBUMIN 4.2 06/29/2017 0807   ALBUMIN 4.6 03/22/2017 1549   AST 19 10/12/2019 0833   ALT 25 10/12/2019 0833   ALKPHOS 60 06/29/2017 0807   BILITOT 0.4 10/12/2019 0833   BILITOT 0.4 03/22/2017 1549   GFRNONAA >60 01/20/2020 0556   GFRNONAA 95 10/12/2019 0833   GFRAA >60 01/20/2020 0556   GFRAA 110 10/12/2019 0833     Diabetic  Labs (most recent): Lab Results  Component Value Date   HGBA1C 10.0 (A) 02/20/2020   HGBA1C 7.0 (A) 10/16/2019   HGBA1C 11.8 (A) 07/07/2019     Lipid Panel ( most recent) Lipid Panel     Component Value Date/Time   CHOL 139 10/12/2019 0833   CHOL 151 03/22/2017 1549   TRIG 225 (H) 10/12/2019 0833   HDL 47 10/12/2019 0833   HDL 47 03/22/2017 1549   CHOLHDL 3.0 10/12/2019 0833   VLDL 21 06/29/2017 0807   LDLCALC 64 10/12/2019 1007  Assessment & Plan:   1. DM type 2 causing vascular disease (HCC) - Patient has currently uncontrolled symptomatic type 2 DM since  54 years of age.  He did not bring any logs nor meter with him.  His point-of-care A1c is 10% increasing from 7%.      Recent labs reviewed with him showing normal renal function.  -his diabetes is complicated by coronary artery disease with stent placement on 03/15/2017 and Joel Ward remains at extremely  high risk for more acute and chronic complications which include CAD, CVA, CKD, retinopathy, and neuropathy. These are all discussed in detail with the patient.  - I have counseled him on diet management and weight loss, by adopting a carbohydrate restricted/protein rich diet. -He did admits to dietary indiscretions including consumption of sweets and sweetened beverages.    - he  admits there is a room for improvement in his diet and drink choices. -  Suggestion is made for him to avoid simple carbohydrates  from his diet including Cakes, Sweet Desserts / Pastries, Ice Cream, Soda (diet and regular), Sweet Tea, Candies, Chips, Cookies, Sweet Pastries,  Store Bought Juices, Alcohol in Excess of  1-2 drinks a day, Artificial Sweeteners, Coffee Creamer, and "Sugar-free" Products. This will help patient to have stable blood glucose profile and potentially avoid unintended weight gain.   - I encouraged him to switch to  unprocessed or minimally processed complex starch and increased protein intake (animal or plant  source), fruits, and vegetables.  - he is advised to stick to a routine mealtimes to eat 3 meals  a day and avoid unnecessary snacks ( to snack only to correct hypoglycemia).   - he has been  scheduled with Norm Salt, RDN, CDE for individualized DM education- consult pending.  - I have approached him with the following individualized plan to manage diabetes and patient agrees:   -He is presenting with loss of control with A1c of 10%.  He admits to have been inconsistent on his dietary recommendations.   -  Based on his glycemic response, he will continue to need insulin treatment in order for him to achieve and maintain control of diabetes to target.   -He may need prandial insulin if he is found to have postprandial hyperglycemia.    -He is advised to resume Toujeo higher dose of 50 units nightly,  associated with monitoring of blood glucose 4 times a day-before meals and at bedtime and return in 10 days for reevaluation.    -He is advised to continue Janumet 50/1000 mg ER once a day after breakfast. -He is encouraged to call clinic for hypoglycemia below 70 and hyperglycemia above 200 mg/dL. -He is benefiting from low-dose glipizide, he is advised to continue glipizide 5 mg XL p.o. daily at breakfast.  2) BP/HTN: -His blood pressure is controlled to target.  He is advised to continue lisinopril 10 mg p.o. daily at breakfast.   3) Lipids/HPL: His recent lipid panel showed controlled LDL at 64.  He is advised to continue atorvastatin 40 mg p.o. nightly.   Side effects and precautions discussed with him.    4)  Weight/Diet: His current BMI is 41.2-clearly complicating his diabetes care.  He is a candidate for modest weight loss.   I discussed with him the fact that he would benefit the most from loss of 5 - 10% of his current body weight .  To achieve his he has to stick to the dietary and exercise regimen provided  to him and will benefit from bariatric surgery.     CDE Consult has been  initiated , exercise, and detailed carbohydrates information provided.  He is given brochure on bariatric surgery.  5) vitamin D deficiency: He is on ongoing vitamin D treatment, 50,000 units of ergocalciferol weekly.    6) Chronic Care/Health Maintenance:  -he  is on ACEI/ARB and Statin medications and  is encouraged to continue to follow up with Ophthalmology, Dentist,  Podiatrist at least yearly or according to recommendations, and advised to  stay away from smoking. I have recommended yearly flu vaccine and pneumonia vaccination at least every 5 years; moderate intensity exercise for up to 150 minutes weekly; and  sleep for at least 7 hours a day.  - I advised patient to maintain close follow up with Elfredia Nevins, MD for primary care needs.  - Time spent on this patient care encounter:  35 min, of which > 50% was spent in  counseling and the rest reviewing his blood glucose logs , discussing his hypoglycemia and hyperglycemia episodes, reviewing his current and  previous labs / studies  ( including abstraction from other facilities) and medications  doses and developing a  long term treatment plan and documenting his care.   Please refer to Patient Instructions for Blood Glucose Monitoring and Insulin/Medications Dosing Guide"  in media tab for additional information. Please  also refer to " Patient Self Inventory" in the Media  tab for reviewed elements of pertinent patient history.  Arm[employer] Fossa Odenthal participated in the discussions, expressed understanding, and voiced agreement with the above plans.  All questions were answered to his satisfaction. he is encouraged to contact clinic should he have any questions or concerns prior to his return visit.   Follow up plan: - Return in about 3 months (around 05/21/2020) for F/U with Meter and Logs Only - no Labs.  Marquis Lunch, MD Phone: [phone]  Fax: [phone]   02/20/2020, 9:33 AM This note was partially dictated with voice  recognition software. Similar sounding words can be transcribed inadequately or may not  be corrected upon review.

## 2020-02-20 NOTE — Patient Instructions (Signed)

## 2020-03-01 ENCOUNTER — Ambulatory Visit (INDEPENDENT_AMBULATORY_CARE_PROVIDER_SITE_OTHER): Payer: BC Managed Care – PPO | Admitting: "Endocrinology

## 2020-03-01 ENCOUNTER — Other Ambulatory Visit: Payer: Self-pay

## 2020-03-01 ENCOUNTER — Encounter: Payer: Self-pay | Admitting: "Endocrinology

## 2020-03-01 VITALS — BP 162/103 | HR 76 | Resp 16 | Ht 71.0 in | Wt 308.8 lb

## 2020-03-01 DIAGNOSIS — E559 Vitamin D deficiency, unspecified: Secondary | ICD-10-CM | POA: Diagnosis not present

## 2020-03-01 DIAGNOSIS — I10 Essential (primary) hypertension: Secondary | ICD-10-CM | POA: Diagnosis not present

## 2020-03-01 DIAGNOSIS — E782 Mixed hyperlipidemia: Secondary | ICD-10-CM

## 2020-03-01 DIAGNOSIS — E1159 Type 2 diabetes mellitus with other circulatory complications: Secondary | ICD-10-CM

## 2020-03-01 NOTE — Progress Notes (Signed)
03/01/2020   Endocrinology follow-up note    Subjective:    [name], male    DOB: 04-Jun-1965.  he is being seen in follow-up after he was seen in consultation for  management of currently uncontrolled symptomatic uncontrolled type 2 diabetes, hyperlipidemia, hypertension.  PMD:   Elfredia Nevins, MD.   Past Medical History:  Diagnosis Date  . CAD (coronary artery disease), native coronary artery    10/18 PCI/DES mLAD, normal EF on LV gram  . Diabetes mellitus    Type 2 diagnosed 2 weeks ago  . Hiatal hernia with gastroesophageal reflux   . Hypertension   . Mixed hyperlipidemia   . Morbid obesity (HCC)   . OSA on CPAP   . Urinary frequency    Past Surgical History:  Procedure Laterality Date  . CIRCUMCISION     1988  . CORONARY STENT INTERVENTION N/A 03/16/2017   Procedure: CORONARY STENT INTERVENTION;  Surgeon: Lyn Records, MD;  Location: [facility] INVASIVE CV LAB;  Service: Cardiovascular;  Laterality: N/A;  . ESOPHAGEAL DILATION N/A 02/08/2015   Procedure: ESOPHAGEAL DILATION;  Surgeon: Malissa Hippo, MD;  Location: [facility];  Service: Endoscopy;  Laterality: N/A;  . ESOPHAGOGASTRODUODENOSCOPY N/A 02/08/2015   Procedure: ESOPHAGOGASTRODUODENOSCOPY (EGD);  Surgeon: Malissa Hippo, MD;  Location: [facility];  Service: Endoscopy;  Laterality: N/A;  1055  . ESOPHAGOGASTRODUODENOSCOPY (EGD) WITH ESOPHAGEAL DILATION  03/29/2012   Procedure: ESOPHAGOGASTRODUODENOSCOPY (EGD) WITH ESOPHAGEAL DILATION;  Surgeon: Malissa Hippo, MD;  Location: [facility];  Service: Endoscopy;  Laterality: N/A;  730  . LEFT HEART CATH AND CORONARY ANGIOGRAPHY N/A 03/16/2017   Procedure: LEFT HEART CATH AND CORONARY ANGIOGRAPHY;  Surgeon: Lyn Records, MD;  Location: [facility] INVASIVE CV LAB;  Service: Cardiovascular;  Laterality: N/A;  . ULTRASOUND GUIDANCE FOR VASCULAR ACCESS  03/16/2017   Procedure: Ultrasound Guidance For Vascular Access;  Surgeon: Lyn Records, MD;   Location: [facility] INVASIVE CV LAB;  Service: Cardiovascular;;   Social History   Socioeconomic History  . Marital status: Married    Spouse name: Not on file  . Number of children: Not on file  . Years of education: Not on file  . Highest education level: Not on file  Occupational History  . Not on file  Tobacco Use  . Smoking status: Never Smoker  . Smokeless tobacco: Never Used  Vaping Use  . Vaping Use: Never used  Substance and Sexual Activity  . Alcohol use: No  . Drug use: No  . Sexual activity: Not on file  Other Topics Concern  . Not on file  Social History Narrative   Drinks caffeine 4 times a week.      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   Social Determinants of Health   Financial Resource Strain:   . Difficulty of Paying Living Expenses: Not on file  Food Insecurity:   . Worried About Programme researcher, broadcasting/film/video in the Last Year: Not on file  . Ran Out of Food in the Last Year: Not on file  Transportation Needs:   . Lack of Transportation (Medical): Not on file  . Lack of Transportation (Non-Medical): Not on file  Physical Activity:   . Days of Exercise per Week: Not on file  . Minutes of Exercise per Session: Not on file  Stress:   . Feeling of Stress : Not on file  Social Connections:   . Frequency of Communication with Friends and Family: Not on  file  . Frequency of Social Gatherings with Friends and Family: Not on file  . Attends Religious Services: Not on file  . Active Member of Clubs or Organizations: Not on file  . Attends Banker Meetings: Not on file  . Marital Status: Not on file   Outpatient Encounter Medications as of 03/01/2020  Medication Sig  . aspirin 81 MG chewable tablet Chew 1 tablet (81 mg total) by mouth daily.  [name] atorvastatin (LIPITOR) 40 MG tablet TAKE 1 TABLET BY MOUTH ONCE DAILY AT  6  PM  . Cholecalciferol (VITAMIN D3) 125 MCG (5000 UT) CAPS Take 1 capsule (5,000 Units total) by mouth daily.  . cyclobenzaprine (FLEXERIL) 10 MG tablet  Take 1 tablet (10 mg total) by mouth 2 (two) times daily as needed for muscle spasms.  [name] glipiZIDE (GLUCOTROL XL) 5 MG 24 hr tablet Take 1 tablet by mouth once daily with breakfast  . ibuprofen (ADVIL) 800 MG tablet Take 800 mg by mouth 3 (three) times daily.  . insulin glargine, 1 Unit Dial, (TOUJEO SOLOSTAR) 300 UNIT/ML Solostar Pen Inject 50 Units into the skin at bedtime.  . Insulin Pen Needle (B-D ULTRAFINE III SHORT PEN) 31G X 8 MM MISC USE 1 PEN NEEDLE AS DIRECTED  . JANUMET XR 50-1000 MG TB24 TAKE 1 TABLET BY MOUTH AFTER BREAKFAST  . lisinopril (ZESTRIL) 10 MG tablet Take 1 tablet by mouth once daily  . Melatonin 2.5 MG CAPS Take 2.5 mg by mouth at bedtime as needed (sleep).  . nitroGLYCERIN (NITROSTAT) 0.4 MG SL tablet Place 1 tablet (0.4 mg total) under the tongue every 5 (five) minutes as needed.  . sildenafil (VIAGRA) 100 MG tablet Take 100 mg by mouth as needed.   No facility-administered encounter medications on file as of 03/01/2020.    ALLERGIES: Allergies  Allergen Reactions  . Bee Venom   . Shellfish Allergy Swelling    Turns red  . Strawberry Extract Rash    VACCINATION STATUS: Immunization History  Administered Date(s) Administered  . Moderna SARS-COVID-2 Vaccination 08/24/2019, 09/27/2019    Diabetes He presents for his follow-up diabetic visit. He has type 2 diabetes mellitus. Onset time: He reports that he was diagnosed at approximate age of 26 years. His disease course has been improving. There are no hypoglycemic associated symptoms. Pertinent negatives for hypoglycemia include no confusion, headaches, pallor, seizures or tremors. Associated symptoms include polydipsia and polyuria. Pertinent negatives for diabetes include no blurred vision, no chest pain, no fatigue, no polyphagia and no weakness. There are no hypoglycemic complications. Symptoms are improving. Diabetic complications include heart disease. Risk factors for coronary artery disease include  dyslipidemia, diabetes mellitus, family history, hypertension, male sex, obesity and sedentary lifestyle. Current diabetic treatment includes oral agent (dual therapy). His weight is fluctuating minimally. He is following a generally unhealthy diet. When asked about meal planning, he reported none. He has not had a previous visit with a dietitian. He participates in exercise intermittently. His home blood glucose trend is decreasing steadily. His breakfast blood glucose range is generally 130-140 mg/dl. His lunch blood glucose range is generally 140-180 mg/dl. His dinner blood glucose range is generally 130-140 mg/dl. His bedtime blood glucose range is generally 130-140 mg/dl. His overall blood glucose range is 130-140 mg/dl. (He presents with significant improvement in his glycemic profile averaging between 100-165 mg/Dl.  This is despite his recent point-of-care A1c of 10% increasing from 7%. ) An ACE inhibitor/angiotensin II receptor blocker is being taken. He does  not see a podiatrist.Eye exam is not current.  Hyperlipidemia This is a chronic problem. The current episode started more than 1 year ago. The problem is controlled. Exacerbating diseases include diabetes and obesity. Pertinent negatives include no chest pain, myalgias or shortness of breath. Current antihyperlipidemic treatment includes statins. Risk factors for coronary artery disease include dyslipidemia, diabetes mellitus, hypertension, male sex, obesity, a sedentary lifestyle and family history.  Hypertension This is a chronic problem. The problem is controlled. Pertinent negatives include no blurred vision, chest pain, headaches, neck pain, palpitations or shortness of breath. Risk factors for coronary artery disease include diabetes mellitus, dyslipidemia, obesity, male gender, sedentary lifestyle and family history. Past treatments include ACE inhibitors. Hypertensive end-organ damage includes CAD/MI.     Review of  systems  Constitutional: + Minimally fluctuating body weight,   current  Body mass index is 43.07 kg/m. , no fatigue, no subjective hyperthermia, no subjective hypothermia Eyes: no blurry vision, no xerophthalmia ENT: no sore throat, no nodules palpated in throat, no dysphagia/odynophagia, no hoarseness Cardiovascular: no Chest Pain, no Shortness of Breath, no palpitations, no leg swelling Respiratory: no cough, no shortness of breath Gastrointestinal: no Nausea/Vomiting/Diarhhea Musculoskeletal: no muscle/joint aches Skin: no rashes, no hyperemia Neurological: no tremors, no numbness, no tingling, no dizziness Psychiatric: no depression, no anxiety   Objective:    BP (!) 162/103   Pulse 76   Resp 16   Ht 5\' 11"  (1.803 m)   Wt (!) 308 lb 12.8 oz (140.1 kg)   SpO2 95%   BMI 43.07 kg/m   Wt Readings from Last 3 Encounters:  03/01/20 (!) 308 lb 12.8 oz (140.1 kg)  02/20/20 (!) 304 lb 3.2 oz (138 kg)  10/16/19 (!) 311 lb 3.2 oz (141.2 kg)      Physical Exam- Limited  Constitutional:  Body mass index is 43.07 kg/m. , not in acute distress, normal state of mind Eyes:  EOMI, no exophthalmos Neck: Supple Thyroid: No gross goiter Respiratory: Adequate breathing efforts Musculoskeletal: no gross deformities, strength intact in all four extremities, no gross restriction of joint movements Skin:  no rashes, no hyperemia Neurological: no tremor with outstretched hands    CMP ( most recent) CMP     Component Value Date/Time   NA 136 01/20/2020 0556   NA 140 03/22/2017 1549   K 3.9 01/20/2020 0556   CL 100 01/20/2020 0556   CO2 26 01/20/2020 0556   GLUCOSE 404 (H) 01/20/2020 0556   BUN 12 01/20/2020 0556   BUN 14 03/22/2017 1549   CREATININE 1.08 01/20/2020 0556   CREATININE 0.93 10/12/2019 0833   CALCIUM 9.0 01/20/2020 0556   PROT 7.7 10/12/2019 0833   PROT 8.3 03/22/2017 1549   ALBUMIN 4.2 06/29/2017 0807   ALBUMIN 4.6 03/22/2017 1549   AST 19 10/12/2019 0833    ALT 25 10/12/2019 0833   ALKPHOS 60 06/29/2017 0807   BILITOT 0.4 10/12/2019 0833   BILITOT 0.4 03/22/2017 1549   GFRNONAA >60 01/20/2020 0556   GFRNONAA 95 10/12/2019 0833   GFRAA >60 01/20/2020 0556   GFRAA 110 10/12/2019 0833     Diabetic Labs (most recent): Lab Results  Component Value Date   HGBA1C 10.0 (A) 02/20/2020   HGBA1C 7.0 (A) 10/16/2019   HGBA1C 11.8 (A) 07/07/2019     Lipid Panel ( most recent) Lipid Panel     Component Value Date/Time   CHOL 139 10/12/2019 0833   CHOL 151 03/22/2017 1549   TRIG 225 (H) 10/12/2019  9735   HDL 47 10/12/2019 0833   HDL 47 03/22/2017 1549   CHOLHDL 3.0 10/12/2019 0833   VLDL 21 06/29/2017 0807   LDLCALC 64 10/12/2019 0833     Assessment & Plan:   1. DM type 2 causing vascular disease (HCC) - Patient has currently uncontrolled symptomatic type 2 DM since  54 years of age.  He presents with significant improvement in his glycemic profile averaging between 100-165 mg/Dl.  This is despite his recent point-of-care A1c of 10% increasing from 7%.    Recent labs reviewed with him showing normal renal function.  -his diabetes is complicated by coronary artery disease with stent placement on 03/15/2017 and DAVINDER HAFF remains at extremely  high risk for more acute and chronic complications which include CAD, CVA, CKD, retinopathy, and neuropathy. These are all discussed in detail with the patient.  - I have counseled him on diet management and weight loss, by adopting a carbohydrate restricted/protein rich diet. -He did admits to dietary indiscretions including consumption of sweets and sweetened beverages.    - he  admits there is a room for improvement in his diet and drink choices. -  Suggestion is made for him to avoid simple carbohydrates  from his diet including Cakes, Sweet Desserts / Pastries, Ice Cream, Soda (diet and regular), Sweet Tea, Candies, Chips, Cookies, Sweet Pastries,  Store Bought Juices, Alcohol in Excess of   1-2 drinks a day, Artificial Sweeteners, Coffee Creamer, and "Sugar-free" Products. This will help patient to have stable blood glucose profile and potentially avoid unintended weight gain.  - I encouraged him to switch to  unprocessed or minimally processed complex starch and increased protein intake (animal or plant source), fruits, and vegetables.  - he is advised to stick to a routine mealtimes to eat 3 meals  a day and avoid unnecessary snacks ( to snack only to correct hypoglycemia).   - he has been  scheduled with Norm Salt, RDN, CDE for individualized DM education- consult pending.  - I have approached him with the following individualized plan to manage diabetes and patient agrees:   -In light of his presentation with near target glycemic profile, he will not need prandial insulin for now.    He has adjusted his dietary intake towards  recommended standards.   -Based on his response to the basal insulin, he is advised to continue Toujeo 50 units nightly, continue to monitor blood glucose twice a day-daily before breakfast and at bedtime.  -He is advised to continue Janumet 50/1000 mg ER once a day after breakfast. -He is encouraged to call clinic for hypoglycemia below 70 and hyperglycemia above 200 mg/dL. -He is benefiting from low-dose glipizide, he is advised to continue glipizide 5 mg XL p.o. daily at breakfast.  2) BP/HTN: -His blood pressure is controlled to target.  He is advised to continue lisinopril 10 mg p.o. daily at breakfast.   3) Lipids/HPL: His recent lipid panel showed controlled LDL at 64.  He is advised to continue atorvastatin 40 mg p.o. nightly.   Side effects and precautions discussed with him.    4)  Weight/Diet: His current BMI is 43.07-clearly complicating his diabetes care.  He is a candidate for modest weight loss.   I discussed with him the fact that he would benefit the most from loss of 5 - 10% of his current body weight .  To achieve his he has  to stick to the dietary and exercise regimen provided to him  and will benefit from bariatric surgery.     CDE Consult has been initiated , exercise, and detailed carbohydrates information provided.  He is given brochure on bariatric surgery.  5) vitamin D deficiency: He is on ongoing vitamin D treatment, 50,000 units of ergocalciferol weekly.    6) Chronic Care/Health Maintenance:  -he  is on ACEI/ARB and Statin medications and  is encouraged to continue to follow up with Ophthalmology, Dentist,  Podiatrist at least yearly or according to recommendations, and advised to  stay away from smoking. I have recommended yearly flu vaccine and pneumonia vaccination at least every 5 years; moderate intensity exercise for up to 150 minutes weekly; and  sleep for at least 7 hours a day.  - I advised patient to maintain close follow up with Elfredia Nevins, MD for primary care needs.  - Time spent on this patient care encounter:  35 min, of which > 50% was spent in  counseling and the rest reviewing his blood glucose logs , discussing his hypoglycemia and hyperglycemia episodes, reviewing his current and  previous labs / studies  ( including abstraction from other facilities) and medications  doses and developing a  long term treatment plan and documenting his care.   Please refer to Patient Instructions for Blood Glucose Monitoring and Insulin/Medications Dosing Guide"  in media tab for additional information. Please  also refer to " Patient Self Inventory" in the Media  tab for reviewed elements of pertinent patient history.  [employer] participated in the discussions, expressed understanding, and voiced agreement with the above plans.  All questions were answered to his satisfaction. he is encouraged to contact clinic should he have any questions or concerns prior to his return visit.    Follow up plan: - Return in about 3 months (around 06/01/2020) for Bring Meter and Logs- A1c in Office.  Marquis Lunch, MD Phone: [phone]  Fax: [phone]   03/01/2020, 10:43 AM This note was partially dictated with voice recognition software. Similar sounding words can be transcribed inadequately or may not  be corrected upon review.

## 2020-03-01 NOTE — Patient Instructions (Signed)

## 2020-03-05 DIAGNOSIS — Z0001 Encounter for general adult medical examination with abnormal findings: Secondary | ICD-10-CM | POA: Diagnosis not present

## 2020-03-05 DIAGNOSIS — Z1331 Encounter for screening for depression: Secondary | ICD-10-CM | POA: Diagnosis not present

## 2020-03-05 DIAGNOSIS — Z0000 Encounter for general adult medical examination without abnormal findings: Secondary | ICD-10-CM | POA: Diagnosis not present

## 2020-03-05 DIAGNOSIS — Z6841 Body Mass Index (BMI) 40.0 and over, adult: Secondary | ICD-10-CM | POA: Diagnosis not present

## 2020-03-12 ENCOUNTER — Encounter (INDEPENDENT_AMBULATORY_CARE_PROVIDER_SITE_OTHER): Payer: Self-pay

## 2020-04-26 ENCOUNTER — Other Ambulatory Visit: Payer: Self-pay | Admitting: "Endocrinology

## 2020-06-04 ENCOUNTER — Other Ambulatory Visit: Payer: Self-pay

## 2020-06-04 ENCOUNTER — Ambulatory Visit (INDEPENDENT_AMBULATORY_CARE_PROVIDER_SITE_OTHER): Payer: BC Managed Care – PPO | Admitting: "Endocrinology

## 2020-06-04 ENCOUNTER — Encounter: Payer: Self-pay | Admitting: "Endocrinology

## 2020-06-04 VITALS — BP 130/84 | HR 80 | Ht 71.0 in | Wt 316.4 lb

## 2020-06-04 DIAGNOSIS — E782 Mixed hyperlipidemia: Secondary | ICD-10-CM | POA: Diagnosis not present

## 2020-06-04 DIAGNOSIS — I10 Essential (primary) hypertension: Secondary | ICD-10-CM

## 2020-06-04 DIAGNOSIS — E1159 Type 2 diabetes mellitus with other circulatory complications: Secondary | ICD-10-CM | POA: Diagnosis not present

## 2020-06-04 DIAGNOSIS — E559 Vitamin D deficiency, unspecified: Secondary | ICD-10-CM | POA: Diagnosis not present

## 2020-06-04 LAB — POCT GLYCOSYLATED HEMOGLOBIN (HGB A1C): HbA1c, POC (controlled diabetic range): 7.1 % — AB (ref 0.0–7.0)

## 2020-06-04 NOTE — Patient Instructions (Signed)

## 2020-06-04 NOTE — Progress Notes (Signed)
06/04/2020   Endocrinology follow-up note    Subjective:    [name], male    DOB: Mar 15, 1966.  he is being seen in follow-up after he was seen in consultation for  management of currently uncontrolled symptomatic uncontrolled type 2 diabetes, hyperlipidemia, hypertension.  PMD:   Redmond School, MD.   Past Medical History:  Diagnosis Date  . CAD (coronary artery disease), native coronary artery    10/18 PCI/DES mLAD, normal EF on LV gram  . Diabetes mellitus    Type 2 diagnosed 2 weeks ago  . Hiatal hernia with gastroesophageal reflux   . Hypertension   . Mixed hyperlipidemia   . Morbid obesity ([place])   . OSA on CPAP   . Urinary frequency    Past Surgical History:  Procedure Laterality Date  . CIRCUMCISION     1988  . CORONARY STENT INTERVENTION N/A 03/16/2017   Procedure: CORONARY STENT INTERVENTION;  Surgeon: Belva Crome, MD;  Location: [facility];  Service: Cardiovascular;  Laterality: N/A;  . ESOPHAGEAL DILATION N/A 02/08/2015   Procedure: ESOPHAGEAL DILATION;  Surgeon: Rogene Houston, MD;  [facility];  Service: Endoscopy;  Laterality: N/A;  . ESOPHAGOGASTRODUODENOSCOPY N/A 02/08/2015   Procedure: ESOPHAGOGASTRODUODENOSCOPY (EGD);  Surgeon: Rogene Houston, MD;  [facility];  Service: Endoscopy;  Laterality: N/A;  1055  . ESOPHAGOGASTRODUODENOSCOPY (EGD) WITH ESOPHAGEAL DILATION  03/29/2012   Procedure: ESOPHAGOGASTRODUODENOSCOPY (EGD) WITH ESOPHAGEAL DILATION;  Surgeon: Rogene Houston, MD;  [facility];  Service: Endoscopy;  Laterality: N/A;  730  . LEFT HEART CATH AND CORONARY ANGIOGRAPHY N/A 03/16/2017   Procedure: LEFT HEART CATH AND CORONARY ANGIOGRAPHY;  Surgeon: Belva Crome, MD;  Location: [facility];  Service: Cardiovascular;  Laterality: N/A;  . ULTRASOUND GUIDANCE FOR VASCULAR ACCESS  03/16/2017   Procedure: Ultrasound Guidance For Vascular Access;  Surgeon: Belva Crome, MD;   Location: [facility];  Service: Cardiovascular;;   Social History   Socioeconomic History  . Marital status: Married    Spouse name: Not on file  . Number of children: Not on file  . Years of education: Not on file  . Highest education level: Not on file  Occupational History  . Not on file  Tobacco Use  . Smoking status: Never Smoker  . Smokeless tobacco: Never Used  Vaping Use  . Vaping Use: Never used  Substance and Sexual Activity  . Alcohol use: No  . Drug use: No  . Sexual activity: Not on file  Other Topics Concern  . Not on file  Social History Narrative   Drinks caffeine 4 times a week.      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   Social Determinants of Health   Financial Resource Strain: Not on file  Food Insecurity: Not on file  Transportation Needs: Not on file  Physical Activity: Not on file  Stress: Not on file  Social Connections: Not on file   Outpatient Encounter Medications as of 06/04/2020  Medication Sig  . aspirin 81 MG chewable tablet Chew 1 tablet (81 mg total) by mouth daily.  [name] atorvastatin (LIPITOR) 40 MG tablet TAKE 1 TABLET BY MOUTH ONCE DAILY AT  6  PM  . Cholecalciferol (VITAMIN D3) 125 MCG (5000 UT) CAPS Take 1 capsule (5,000 Units total) by mouth daily.  . cyclobenzaprine (FLEXERIL) 10 MG tablet Take 1 tablet (10 mg total) by mouth 2 (two) times daily  as needed for muscle spasms.  [name] glipiZIDE (GLUCOTROL XL) 5 MG 24 hr tablet Take 1 tablet by mouth once daily with breakfast  . ibuprofen (ADVIL) 800 MG tablet Take 800 mg by mouth 3 (three) times daily.  . insulin glargine, 1 Unit Dial, (TOUJEO SOLOSTAR) 300 UNIT/ML Solostar Pen Inject 50 Units into the skin at bedtime.  . Insulin Pen Needle (B-D ULTRAFINE III SHORT PEN) 31G X 8 MM MISC USE 1 PEN NEEDLE AS DIRECTED  . JANUMET XR 50-1000 MG TB24 TAKE 1 TABLET BY MOUTH AFTER BREAKFAST  . lisinopril (ZESTRIL) 10 MG tablet Take 1 tablet by mouth once daily  . Melatonin 2.5 MG CAPS Take 2.5 mg by  mouth at bedtime as needed (sleep).  . nitroGLYCERIN (NITROSTAT) 0.4 MG SL tablet Place 1 tablet (0.4 mg total) under the tongue every 5 (five) minutes as needed.  . sildenafil (VIAGRA) 100 MG tablet Take 100 mg by mouth as needed.   No facility-administered encounter medications on file as of 06/04/2020.    ALLERGIES: Allergies  Allergen Reactions  . Bee Venom   . Shellfish Allergy Swelling    Turns red  . Strawberry Extract Rash    VACCINATION STATUS: Immunization History  Administered Date(s) Administered  . Moderna Sars-Covid-2 Vaccination 08/24/2019, 09/27/2019    Diabetes He presents for his follow-up diabetic visit. He has type 2 diabetes mellitus. Onset time: He reports that he was diagnosed at approximate age of 55 years. His disease course has been improving. There are no hypoglycemic associated symptoms. Pertinent negatives for hypoglycemia include no confusion, headaches, pallor, seizures or tremors. Associated symptoms include polydipsia and polyuria. Pertinent negatives for diabetes include no blurred vision, no chest pain, no fatigue, no polyphagia and no weakness. There are no hypoglycemic complications. Symptoms are improving. Diabetic complications include heart disease. Risk factors for coronary artery disease include dyslipidemia, diabetes mellitus, family history, hypertension, male sex, obesity and sedentary lifestyle. Current diabetic treatment includes oral agent (dual therapy). His weight is increasing steadily. He is following a generally unhealthy diet. When asked about meal planning, he reported none. He has not had a previous visit with a dietitian. He participates in exercise intermittently. His home blood glucose trend is decreasing steadily. His breakfast blood glucose range is generally 110-130 mg/dl. His bedtime blood glucose range is generally 130-140 mg/dl. His overall blood glucose range is 130-140 mg/dl. (He presents with continued improvement in his  glycemic profile to near target levels.  His point-of-care A1c is 7.1% improving from 10%.  No significant hypoglycemia documented or reported.   ) An ACE inhibitor/angiotensin II receptor blocker is being taken. He does not see a podiatrist.Eye exam is not current.  Hyperlipidemia This is a chronic problem. The current episode started more than 1 year ago. The problem is controlled. Exacerbating diseases include diabetes and obesity. Pertinent negatives include no chest pain, myalgias or shortness of breath. Current antihyperlipidemic treatment includes statins. Risk factors for coronary artery disease include dyslipidemia, diabetes mellitus, hypertension, male sex, obesity, a sedentary lifestyle and family history.  Hypertension This is a chronic problem. The problem is controlled. Pertinent negatives include no blurred vision, chest pain, headaches, neck pain, palpitations or shortness of breath. Risk factors for coronary artery disease include diabetes mellitus, dyslipidemia, obesity, male gender, sedentary lifestyle and family history. Past treatments include ACE inhibitors. Hypertensive end-organ damage includes CAD/MI.     Review of systems Limited as above.   Objective:    BP 130/84   Pulse 80  Ht 5\' 11"  (1.803 m)   Wt (!) 316 lb 6.4 oz (143.5 kg)   BMI 44.13 kg/m   Wt Readings from Last 3 Encounters:  06/04/20 (!) 316 lb 6.4 oz (143.5 kg)  03/01/20 (!) 308 lb 12.8 oz (140.1 kg)  02/20/20 (!) 304 lb 3.2 oz (138 kg)      Physical Exam- Limited  Constitutional:  Body mass index is 44.13 kg/m. , not in acute distress, normal state of mind     CMP ( most recent) CMP     Component Value Date/Time   NA 136 01/20/2020 0556   NA 140 03/22/2017 1549   K 3.9 01/20/2020 0556   CL 100 01/20/2020 0556   CO2 26 01/20/2020 0556   GLUCOSE 404 (H) 01/20/2020 0556   BUN 12 01/20/2020 0556   BUN 14 03/22/2017 1549   CREATININE 1.08 01/20/2020 0556   CREATININE 0.93 10/12/2019  0833   CALCIUM 9.0 01/20/2020 0556   PROT 7.7 10/12/2019 0833   PROT 8.3 03/22/2017 1549   ALBUMIN 4.2 06/29/2017 0807   ALBUMIN 4.6 03/22/2017 1549   AST 19 10/12/2019 0833   ALT 25 10/12/2019 0833   ALKPHOS 60 06/29/2017 0807   BILITOT 0.4 10/12/2019 0833   BILITOT 0.4 03/22/2017 1549   GFRNONAA >60 01/20/2020 0556   GFRNONAA 95 10/12/2019 0833   GFRAA >60 01/20/2020 0556   GFRAA 110 10/12/2019 0833     Diabetic Labs (most recent): Lab Results  Component Value Date   HGBA1C 7.1 (A) 06/04/2020   HGBA1C 10.0 (A) 02/20/2020   HGBA1C 7.0 (A) 10/16/2019     Lipid Panel ( most recent) Lipid Panel     Component Value Date/Time   CHOL 139 10/12/2019 0833   CHOL 151 03/22/2017 1549   TRIG 225 (H) 10/12/2019 0833   HDL 47 10/12/2019 0833   HDL 47 03/22/2017 1549   CHOLHDL 3.0 10/12/2019 0833   VLDL 21 06/29/2017 0807   LDLCALC 64 10/12/2019 0833     Assessment & Plan:   1. DM type 2 causing vascular disease ([facility]) - Patient has currently uncontrolled symptomatic type 2 DM since  55 years of age.  He presents with continued improvement in his glycemic profile to near target levels.  His point-of-care A1c is 7.1% improving from 10%.  No significant hypoglycemia documented or reported.     Recent labs reviewed with him showing normal renal function.  -his diabetes is complicated by coronary artery disease with stent placement on 03/15/2017 and WAEL MAESTAS remains at extremely  high risk for more acute and chronic complications which include CAD, CVA, CKD, retinopathy, and neuropathy. These are all discussed in detail with the patient.  - I have counseled him on diet management and weight loss, by adopting a carbohydrate restricted/protein rich diet. -He did admits to dietary indiscretions including consumption of sweets and sweetened beverages.    - he acknowledges that there is a room for improvement in his food and drink choices. - Suggestion is made for him to avoid  simple carbohydrates  from his diet including Cakes, Sweet Desserts, Ice Cream, Soda (diet and regular), Sweet Tea, Candies, Chips, Cookies, Store Bought Juices, Alcohol in Excess of  1-2 drinks a day, Artificial Sweeteners,  Coffee Creamer, and "Sugar-free" Products, Lemonade. This will help patient to have more stable blood glucose profile and potentially avoid unintended weight gain.    - I encouraged him to switch to  unprocessed or minimally processed complex starch  and increased protein intake (animal or plant source), fruits, and vegetables.  - he is advised to stick to a routine mealtimes to eat 3 meals  a day and avoid unnecessary snacks ( to snack only to correct hypoglycemia).   - he has been  scheduled with Norm Salt, RDN, CDE for individualized DM education- consult pending.  - I have approached him with the following individualized plan to manage diabetes and patient agrees:   -In light of his presentation with near target glycemic profile, he will not need prandial insulin for now.    -Based on his response to the basal insulin, he is advised to continue Toujeo 50 units nightly,  continue to monitor blood glucose twice a day-daily before breakfast and at bedtime.  -He is advised to continue Janumet 50/1000 mg ER once a day after breakfast. -He is encouraged to call clinic for hypoglycemia below 70 and hyperglycemia above 200 mg/dL. -He is advised to continue  glipizide 5 mg XL p.o. daily at breakfast.  2) BP/HTN: -His blood pressure is controlled to target.  He is advised to continue lisinopril 10 mg p.o. daily at breakfast.   3) Lipids/HPL: His recent lipid panel showed controlled LDL at 64.  He is advised to continue atorvastatin 40 mg p.o. nightly.     Side effects and precautions discussed with him.    4)  Weight/Diet: His current BMI is 43.07-clearly complicating his diabetes care.  He is a candidate for modest weight loss.   I discussed with him the fact that he  would benefit the most from loss of 5 - 10% of his current body weight .  To achieve his he has to stick to the dietary and exercise regimen provided to him and will benefit from bariatric surgery.     CDE Consult has been initiated , exercise, and detailed carbohydrates information provided.  He is given brochure on bariatric surgery.  5) vitamin D deficiency: He is on ongoing vitamin D treatment, 50,000 units of ergocalciferol weekly.    6) Chronic Care/Health Maintenance:  -he  is on ACEI/ARB and Statin medications and  is encouraged to continue to follow up with Ophthalmology, Dentist,  Podiatrist at least yearly or according to recommendations, and advised to  stay away from smoking. I have recommended yearly flu vaccine and pneumonia vaccination at least every 5 years; moderate intensity exercise for up to 150 minutes weekly; and  sleep for at least 7 hours a day.  - I advised patient to maintain close follow up with Elfredia Nevins, MD for primary care needs.  - Time spent on this patient care encounter:  35 min, of which > 50% was spent in  counseling and the rest reviewing his blood glucose logs , discussing his hypoglycemia and hyperglycemia episodes, reviewing his current and  previous labs / studies  ( including abstraction from other facilities) and medications  doses and developing a  long term treatment plan and documenting his care.   Please refer to Patient Instructions for Blood Glucose Monitoring and Insulin/Medications Dosing Guide"  in media tab for additional information. Please  also refer to " Patient Self Inventory" in the Media  tab for reviewed elements of pertinent patient history.  [employer] participated in the discussions, expressed understanding, and voiced agreement with the above plans.  All questions were answered to his satisfaction. he is encouraged to contact clinic should he have any questions or concerns prior to his return visit.   Follow  up plan: -  Return in about 3 months (around 09/02/2020) for F/U with Pre-visit Labs, Meter, Logs, A1c here.Marquis Lunch, MD Phone: [phone]  Fax: [phone]   06/04/2020, 12:47 PM This note was partially dictated with voice recognition software. Similar sounding words can be transcribed inadequately or may not  be corrected upon review.

## 2020-06-12 DIAGNOSIS — F4324 Adjustment disorder with disturbance of conduct: Secondary | ICD-10-CM | POA: Diagnosis not present

## 2020-07-09 DIAGNOSIS — F4324 Adjustment disorder with disturbance of conduct: Secondary | ICD-10-CM | POA: Diagnosis not present

## 2020-07-26 ENCOUNTER — Other Ambulatory Visit: Payer: Self-pay | Admitting: "Endocrinology

## 2020-08-05 DIAGNOSIS — F4324 Adjustment disorder with disturbance of conduct: Secondary | ICD-10-CM | POA: Diagnosis not present

## 2020-08-23 DIAGNOSIS — F4324 Adjustment disorder with disturbance of conduct: Secondary | ICD-10-CM | POA: Diagnosis not present

## 2020-09-04 ENCOUNTER — Ambulatory Visit: Payer: BC Managed Care – PPO | Admitting: "Endocrinology

## 2020-09-07 ENCOUNTER — Other Ambulatory Visit: Payer: Self-pay | Admitting: "Endocrinology

## 2020-09-09 ENCOUNTER — Other Ambulatory Visit: Payer: Self-pay | Admitting: "Endocrinology

## 2020-09-09 DIAGNOSIS — E1159 Type 2 diabetes mellitus with other circulatory complications: Secondary | ICD-10-CM | POA: Diagnosis not present

## 2020-09-10 LAB — LIPID PANEL
Chol/HDL Ratio: 3.2 ratio (ref 0.0–5.0)
Cholesterol, Total: 154 mg/dL (ref 100–199)
HDL: 48 mg/dL (ref >39)
LDL Chol Calc (NIH): 91 mg/dL (ref 0–99)
Triglycerides: 76 mg/dL (ref 0–149)
VLDL Cholesterol Cal: 15 mg/dL (ref 5–40)

## 2020-09-10 LAB — COMPREHENSIVE METABOLIC PANEL
ALT: 25 IU/L (ref 0–44)
AST: 16 IU/L (ref 0–40)
Albumin/Globulin Ratio: 1.5 (ref 1.2–2.2)
Albumin: 4.4 g/dL (ref 3.8–4.9)
Alkaline Phosphatase: 95 IU/L (ref 44–121)
BUN/Creatinine Ratio: 11 (ref 9–20)
BUN: 13 mg/dL (ref 6–24)
Bilirubin Total: 0.5 mg/dL (ref 0.0–1.2)
CO2: 27 mmol/L (ref 20–29)
Calcium: 9.6 mg/dL (ref 8.7–10.2)
Chloride: 100 mmol/L (ref 96–106)
Creatinine, Ser: 1.14 mg/dL (ref 0.76–1.27)
Globulin, Total: 3 g/dL (ref 1.5–4.5)
Glucose: 189 mg/dL — ABNORMAL HIGH (ref 65–99)
Potassium: 4.6 mmol/L (ref 3.5–5.2)
Sodium: 142 mmol/L (ref 134–144)
Total Protein: 7.4 g/dL (ref 6.0–8.5)
eGFR: 77 mL/min/{1.73_m2} (ref >59)

## 2020-09-11 ENCOUNTER — Ambulatory Visit: Payer: BC Managed Care – PPO | Admitting: "Endocrinology

## 2020-09-11 ENCOUNTER — Other Ambulatory Visit: Payer: Self-pay

## 2020-09-11 ENCOUNTER — Encounter: Payer: Self-pay | Admitting: "Endocrinology

## 2020-09-11 VITALS — BP 158/86 | HR 75 | Ht 71.0 in | Wt 310.6 lb

## 2020-09-11 DIAGNOSIS — I10 Essential (primary) hypertension: Secondary | ICD-10-CM

## 2020-09-11 DIAGNOSIS — E1165 Type 2 diabetes mellitus with hyperglycemia: Secondary | ICD-10-CM | POA: Diagnosis not present

## 2020-09-11 DIAGNOSIS — E1159 Type 2 diabetes mellitus with other circulatory complications: Secondary | ICD-10-CM | POA: Diagnosis not present

## 2020-09-11 DIAGNOSIS — E782 Mixed hyperlipidemia: Secondary | ICD-10-CM

## 2020-09-11 DIAGNOSIS — E559 Vitamin D deficiency, unspecified: Secondary | ICD-10-CM

## 2020-09-11 LAB — POCT GLYCOSYLATED HEMOGLOBIN (HGB A1C): HbA1c, POC (controlled diabetic range): 8.3 % — AB (ref 0.0–7.0)

## 2020-09-11 MED ORDER — TOUJEO SOLOSTAR 300 UNIT/ML ~~LOC~~ SOPN: 60.0000 [IU] | PEN_INJECTOR | Freq: Every day | SUBCUTANEOUS | 1 refills | Status: DC

## 2020-09-11 NOTE — Progress Notes (Signed)
09/11/2020   Endocrinology follow-up note    Subjective:    [name], male    DOB: 1966/03/21.  he is being seen in follow-up after he was seen in consultation for  management of currently uncontrolled symptomatic uncontrolled type 2 diabetes, hyperlipidemia, hypertension.  PMD:   Elfredia Nevins, MD.   Past Medical History:  Diagnosis Date  . CAD (coronary artery disease), native coronary artery    10/18 PCI/DES mLAD, normal EF on LV gram  . Diabetes mellitus    Type 2 diagnosed 2 weeks ago  . Hiatal hernia with gastroesophageal reflux   . Hypertension   . Mixed hyperlipidemia   . Morbid obesity (HCC)   . OSA on CPAP   . Urinary frequency    Past Surgical History:  Procedure Laterality Date  . CIRCUMCISION     1988  . CORONARY STENT INTERVENTION N/A 03/16/2017   Procedure: CORONARY STENT INTERVENTION;  Surgeon: Lyn Records, MD;  Location: [facility] INVASIVE CV LAB;  Service: Cardiovascular;  Laterality: N/A;  . ESOPHAGEAL DILATION N/A 02/08/2015   Procedure: ESOPHAGEAL DILATION;  Surgeon: Malissa Hippo, MD;  Location: [facility] ENDO SUITE;  Service: Endoscopy;  Laterality: N/A;  . ESOPHAGOGASTRODUODENOSCOPY N/A 02/08/2015   Procedure: ESOPHAGOGASTRODUODENOSCOPY (EGD);  Surgeon: Malissa Hippo, MD;  Location: [facility] ENDO SUITE;  Service: Endoscopy;  Laterality: N/A;  1055  . ESOPHAGOGASTRODUODENOSCOPY (EGD) WITH ESOPHAGEAL DILATION  03/29/2012   Procedure: ESOPHAGOGASTRODUODENOSCOPY (EGD) WITH ESOPHAGEAL DILATION;  Surgeon: Malissa Hippo, MD;  Location: [facility];  Service: Endoscopy;  Laterality: N/A;  730  . LEFT HEART CATH AND CORONARY ANGIOGRAPHY N/A 03/16/2017   Procedure: LEFT HEART CATH AND CORONARY ANGIOGRAPHY;  Surgeon: Lyn Records, MD;  Location: [facility] INVASIVE CV LAB;  Service: Cardiovascular;  Laterality: N/A;  . ULTRASOUND GUIDANCE FOR VASCULAR ACCESS  03/16/2017   Procedure: Ultrasound Guidance For Vascular Access;  Surgeon: Lyn Records, MD;   Location: [facility] INVASIVE CV LAB;  Service: Cardiovascular;;   Social History   Socioeconomic History  . Marital status: Married    Spouse name: Not on file  . Number of children: Not on file  . Years of education: Not on file  . Highest education level: Not on file  Occupational History  . Not on file  Tobacco Use  . Smoking status: Never Smoker  . Smokeless tobacco: Never Used  Vaping Use  . Vaping Use: Never used  Substance and Sexual Activity  . Alcohol use: No  . Drug use: No  . Sexual activity: Not on file  Other Topics Concern  . Not on file  Social History Narrative   Drinks caffeine 4 times a week.      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   Social Determinants of Health   Financial Resource Strain: Not on file  Food Insecurity: Not on file  Transportation Needs: Not on file  Physical Activity: Not on file  Stress: Not on file  Social Connections: Not on file   Outpatient Encounter Medications as of 09/11/2020  Medication Sig  . aspirin 81 MG chewable tablet Chew 1 tablet (81 mg total) by mouth daily.  [name] atorvastatin (LIPITOR) 40 MG tablet TAKE 1 TABLET BY MOUTH ONCE DAILY AT  6  PM  . Cholecalciferol (VITAMIN D3) 125 MCG (5000 UT) CAPS Take 1 capsule (5,000 Units total) by mouth daily.  . cyclobenzaprine (FLEXERIL) 10 MG tablet Take 1 tablet (10 mg total) by mouth 2 (two) times daily  as needed for muscle spasms.  [name]. glipiZIDE (GLUCOTROL XL) 5 MG 24 hr tablet Take 1 tablet by mouth once daily with breakfast  . ibuprofen (ADVIL) 800 MG tablet Take 800 mg by mouth 3 (three) times daily.  . insulin glargine, 1 Unit Dial, (TOUJEO SOLOSTAR) 300 UNIT/ML Solostar Pen Inject 60 Units into the skin at bedtime.  . Insulin Pen Needle (B-D ULTRAFINE III SHORT PEN) 31G X 8 MM MISC USE 1 PEN NEEDLE AS DIRECTED  . JANUMET XR 50-1000 MG TB24 TAKE 1 TABLET BY MOUTH AFTER BREAKFAST  . lisinopril (ZESTRIL) 10 MG tablet Take 1 tablet by mouth once daily  . Melatonin 2.5 MG CAPS Take 2.5 mg by  mouth at bedtime as needed (sleep).  . nitroGLYCERIN (NITROSTAT) 0.4 MG SL tablet Place 1 tablet (0.4 mg total) under the tongue every 5 (five) minutes as needed.  . sildenafil (VIAGRA) 100 MG tablet Take 100 mg by mouth as needed.  . [DISCONTINUED] TOUJEO SOLOSTAR 300 UNIT/ML Solostar Pen INJECT 50 UNITS SUBCUTANEOUSLY AT BEDTIME   No facility-administered encounter medications on file as of 09/11/2020.    ALLERGIES: Allergies  Allergen Reactions  . Bee Venom   . Shellfish Allergy Swelling    Turns red  . Strawberry Extract Rash    VACCINATION STATUS: Immunization History  Administered Date(s) Administered  . Moderna Sars-Covid-2 Vaccination 08/24/2019, 09/27/2019    Diabetes He presents for his follow-up diabetic visit. He has type 2 diabetes mellitus. Onset time: He reports that he was diagnosed at approximate age of 55 years. His disease course has been worsening. There are no hypoglycemic associated symptoms. Pertinent negatives for hypoglycemia include no confusion, headaches, pallor, seizures or tremors. Associated symptoms include polydipsia and polyuria. Pertinent negatives for diabetes include no blurred vision, no chest pain, no fatigue, no polyphagia and no weakness. There are no hypoglycemic complications. Symptoms are worsening. Diabetic complications include heart disease. Risk factors for coronary artery disease include dyslipidemia, diabetes mellitus, family history, hypertension, male sex, obesity and sedentary lifestyle. Current diabetic treatment includes oral agent (dual therapy). His weight is fluctuating minimally. He is following a generally unhealthy diet. When asked about meal planning, he reported none. He has not had a previous visit with a dietitian. He participates in exercise intermittently. His home blood glucose trend is increasing steadily. (He presents without his meter.  He denies hypoglycemia.  He reports blood glucose reading readings are mostly in the  180-220 average.  His point-of-care A1c is 8.3%, increasing from 7.1%.    ) An ACE inhibitor/angiotensin II receptor blocker is being taken. He does not see a podiatrist.Eye exam is not current.  Hyperlipidemia This is a chronic problem. The current episode started more than 1 year ago. The problem is controlled. Exacerbating diseases include diabetes and obesity. Pertinent negatives include no chest pain, myalgias or shortness of breath. Current antihyperlipidemic treatment includes statins. Risk factors for coronary artery disease include dyslipidemia, diabetes mellitus, hypertension, male sex, obesity, a sedentary lifestyle and family history.  Hypertension This is a chronic problem. The problem is controlled. Pertinent negatives include no blurred vision, chest pain, headaches, neck pain, palpitations or shortness of breath. Risk factors for coronary artery disease include diabetes mellitus, dyslipidemia, obesity, male gender, sedentary lifestyle and family history. Past treatments include ACE inhibitors. Hypertensive end-organ damage includes CAD/MI.     Review of systems Limited as above.   Objective:    BP (!) 158/86   Pulse 75   Ht 5\' 11"  (1.803 m)  Wt (!) 310 lb 9.6 oz (140.9 kg)   BMI 43.32 kg/m   Wt Readings from Last 3 Encounters:  09/11/20 (!) 310 lb 9.6 oz (140.9 kg)  06/04/20 (!) 316 lb 6.4 oz (143.5 kg)  03/01/20 (!) 308 lb 12.8 oz (140.1 kg)      Physical Exam- Limited  Constitutional:  Body mass index is 43.32 kg/m. , not in acute distress, normal state of mind   CMP ( most recent) CMP     Component Value Date/Time   NA 142 09/09/2020 0854   K 4.6 09/09/2020 0854   CL 100 09/09/2020 0854   CO2 27 09/09/2020 0854   GLUCOSE 189 (H) 09/09/2020 0854   GLUCOSE 404 (H) 01/20/2020 0556   BUN 13 09/09/2020 0854   CREATININE 1.14 09/09/2020 0854   CREATININE 0.93 10/12/2019 0833   CALCIUM 9.6 09/09/2020 0854   PROT 7.4 09/09/2020 0854   ALBUMIN 4.4  09/09/2020 0854   AST 16 09/09/2020 0854   ALT 25 09/09/2020 0854   ALKPHOS 95 09/09/2020 0854   BILITOT 0.5 09/09/2020 0854   GFRNONAA >60 01/20/2020 0556   GFRNONAA 95 10/12/2019 0833   GFRAA >60 01/20/2020 0556   GFRAA 110 10/12/2019 0833     Diabetic Labs (most recent): Lab Results  Component Value Date   HGBA1C 8.3 (A) 09/11/2020   HGBA1C 7.1 (A) 06/04/2020   HGBA1C 10.0 (A) 02/20/2020     Lipid Panel ( most recent) Lipid Panel     Component Value Date/Time   CHOL 154 09/09/2020 0854   TRIG 76 09/09/2020 0854   HDL 48 09/09/2020 0854   CHOLHDL 3.2 09/09/2020 0854   CHOLHDL 3.0 10/12/2019 0833   VLDL 21 06/29/2017 0807   LDLCALC 91 09/09/2020 0854   LDLCALC 64 10/12/2019 0833     Assessment & Plan:   1. DM type 2 causing vascular disease (HCC) - Patient has currently uncontrolled symptomatic type 2 DM since  55 years of age.  He presents without his meter.  He denies hypoglycemia.  He reports blood glucose reading readings are mostly in the 180-220 average.  His point-of-care A1c is 8.3%, increasing from 7.1%.     Recent labs reviewed with him showing normal renal function.  -his diabetes is complicated by coronary artery disease with stent placement on 03/15/2017 and OFFIE WAIDE remains at extremely  high risk for more acute and chronic complications which include CAD, CVA, CKD, retinopathy, and neuropathy. These are all discussed in detail with the patient.  - I have counseled him on diet management and weight loss, by adopting a carbohydrate restricted/protein rich diet. -He did admits to dietary indiscretions including consumption of sweets and sweetened beverages.     - he acknowledges that there is a room for improvement in his food and drink choices. - Suggestion is made for him to avoid simple carbohydrates  from his diet including Cakes, Sweet Desserts, Ice Cream, Soda (diet and regular), Sweet Tea, Candies, Chips, Cookies, Store Bought Juices,  Alcohol in Excess of  1-2 drinks a day, Artificial Sweeteners,  Coffee Creamer, and "Sugar-free" Products, Lemonade. This will help patient to have more stable blood glucose profile and potentially avoid unintended weight gain.  - I encouraged him to switch to  unprocessed or minimally processed complex starch and increased protein intake (animal or plant source), fruits, and vegetables.  - he is advised to stick to a routine mealtimes to eat 3 meals  a day and avoid unnecessary  snacks ( to snack only to correct hypoglycemia).   - he has been  scheduled with Norm Salt, RDN, CDE for individualized DM education- consult pending.  - I have approached him with the following individualized plan to manage diabetes and patient agrees:   -In light of his presentation with higher A1c of 8.3%, he is approached for higher dose of insulin.   - he is advised to increase Toujeo to 60 units nightly,  continue to monitor blood glucose twice a day-daily before breakfast and at bedtime.  -He is advised to continue Janumet 50/1000 mg ER once a day after breakfast. -He is encouraged to call clinic for hypoglycemia below 70 and hyperglycemia above 200 mg/dL. -He is benefiting from glipizide, advised to continue  glipizide 5 mg XL p.o. daily at breakfast.  2) BP/HTN: -His blood pressure is not controlled to target.  He is on lisinopril 10 mg p.o. daily.   His next dose of lisinopril will be 20 mg p.o. daily at breakfast.   3) Lipids/HPL: His recent lipid panel showed controlled LDL at 64.  He is advised to continue atorvastatin 40 mg p.o. nightly.  Side effects and precautions discussed with him.     4)  Weight/Diet: His current BMI is 43.3- clearly complicating his diabetes care.  He is a candidate for modest weight loss.   I discussed with him the fact that he would benefit the most from loss of 5 - 10% of his current body weight .  To achieve his he has to stick to the dietary and exercise regimen  provided to him and will benefit from bariatric surgery.     CDE Consult has been initiated , exercise, and detailed carbohydrates information provided.  I discussed and recommended bariatric surgery with him, he is given brochure on bariatric surgery.  5) vitamin D deficiency: He is on ongoing supplement with vitamin D 2 50,000 units weekly.      6) Chronic Care/Health Maintenance:  -he  is on ACEI/ARB and Statin medications and  is encouraged to continue to follow up with Ophthalmology, Dentist,  Podiatrist at least yearly or according to recommendations, and advised to  stay away from smoking. I have recommended yearly flu vaccine and pneumonia vaccination at least every 5 years; moderate intensity exercise for up to 150 minutes weekly; and  sleep for at least 7 hours a day.   POCT ABI Results 09/11/20  Screening ABI is negative for PAD today AP 13 2022.  His next study is due in April 2027, or sooner if needed. Right ABI: 1.04      left ABI: 1.11  Right leg systolic / diastolic: 165/1 1 mmHg Left leg systolic / diastolic: 176/113 mmHg  Arm systolic / diastolic: 158/86 mmHG  - I advised patient to maintain close follow up with Elfredia Nevins, MD for primary care needs.    I spent 45 minutes in the care of the patient today including review of labs from CMP, Lipids, Thyroid Function, Hematology (current and previous including abstractions from other facilities); face-to-face time discussing  his blood glucose readings/logs, discussing hypoglycemia and hyperglycemia episodes and symptoms, medications doses, his options of short and long term treatment based on the latest standards of care / guidelines;  discussion about incorporating lifestyle medicine;  and documenting the encounter.    Please refer to Patient Instructions for Blood Glucose Monitoring and Insulin/Medications Dosing Guide"  in media tab for additional information. Please  also refer to " Patient Self  Inventory" in the  Media  tab for reviewed elements of pertinent patient history. We discussed in detail about type 2 diabetes, hyperlipidemia, hypertension, weight management. Army Fossa Minich participated in the discussions, expressed understanding, and voiced agreement with the above plans.  All questions were answered to his satisfaction. he is encouraged to contact clinic should he have any questions or concerns prior to his return visit.    Follow up plan: - Return in about 4 months (around 01/11/2021) for NV with Neilani Duffee, Bring Meter and Logs- A1c in Office.  Marquis Lunch, MD Phone: [phone]  Fax: [phone]   09/11/2020, 4:52 PM This note was partially dictated with voice recognition software. Similar sounding words can be transcribed inadequately or may not  be corrected upon review.

## 2020-09-11 NOTE — Patient Instructions (Signed)

## 2020-09-17 ENCOUNTER — Other Ambulatory Visit: Payer: Self-pay | Admitting: "Endocrinology

## 2020-09-17 DIAGNOSIS — F4324 Adjustment disorder with disturbance of conduct: Secondary | ICD-10-CM | POA: Diagnosis not present

## 2020-09-17 DIAGNOSIS — Z6841 Body Mass Index (BMI) 40.0 and over, adult: Secondary | ICD-10-CM | POA: Diagnosis not present

## 2020-09-17 DIAGNOSIS — N529 Male erectile dysfunction, unspecified: Secondary | ICD-10-CM | POA: Diagnosis not present

## 2020-10-18 ENCOUNTER — Other Ambulatory Visit: Payer: Self-pay | Admitting: "Endocrinology

## 2020-10-24 DIAGNOSIS — F4324 Adjustment disorder with disturbance of conduct: Secondary | ICD-10-CM | POA: Diagnosis not present

## 2020-11-05 DIAGNOSIS — H524 Presbyopia: Secondary | ICD-10-CM | POA: Diagnosis not present

## 2020-11-05 DIAGNOSIS — H1012 Acute atopic conjunctivitis, left eye: Secondary | ICD-10-CM | POA: Diagnosis not present

## 2020-11-05 DIAGNOSIS — E119 Type 2 diabetes mellitus without complications: Secondary | ICD-10-CM | POA: Diagnosis not present

## 2020-11-06 ENCOUNTER — Other Ambulatory Visit: Payer: Self-pay | Admitting: "Endocrinology

## 2020-11-29 DIAGNOSIS — F4324 Adjustment disorder with disturbance of conduct: Secondary | ICD-10-CM | POA: Diagnosis not present

## 2020-12-02 ENCOUNTER — Other Ambulatory Visit: Payer: Self-pay | Admitting: "Endocrinology

## 2020-12-03 ENCOUNTER — Other Ambulatory Visit: Payer: Self-pay | Admitting: "Endocrinology

## 2020-12-20 DIAGNOSIS — F4324 Adjustment disorder with disturbance of conduct: Secondary | ICD-10-CM | POA: Diagnosis not present

## 2021-01-09 DIAGNOSIS — F4324 Adjustment disorder with disturbance of conduct: Secondary | ICD-10-CM | POA: Diagnosis not present

## 2021-01-15 ENCOUNTER — Other Ambulatory Visit: Payer: Self-pay | Admitting: "Endocrinology

## 2021-01-16 ENCOUNTER — Other Ambulatory Visit: Payer: Self-pay

## 2021-01-16 ENCOUNTER — Ambulatory Visit: Payer: BC Managed Care – PPO | Admitting: "Endocrinology

## 2021-01-16 ENCOUNTER — Encounter: Payer: Self-pay | Admitting: "Endocrinology

## 2021-01-16 VITALS — BP 156/97 | HR 84 | Ht 71.0 in | Wt 314.2 lb

## 2021-01-16 DIAGNOSIS — E1159 Type 2 diabetes mellitus with other circulatory complications: Secondary | ICD-10-CM | POA: Diagnosis not present

## 2021-01-16 LAB — POCT GLYCOSYLATED HEMOGLOBIN (HGB A1C): HbA1c, POC (controlled diabetic range): 7.2 % — AB (ref 0.0–7.0)

## 2021-01-16 MED ORDER — LISINOPRIL 20 MG PO TABS
20.0000 mg | ORAL_TABLET | Freq: Every day | ORAL | 1 refills | Status: DC
Start: 2021-01-16 — End: 2021-08-11

## 2021-01-16 MED ORDER — TOUJEO SOLOSTAR 300 UNIT/ML ~~LOC~~ SOPN
50.0000 [IU] | PEN_INJECTOR | Freq: Every day | SUBCUTANEOUS | 0 refills | Status: DC
Start: 2021-01-16 — End: 2021-03-10

## 2021-01-16 NOTE — Patient Instructions (Signed)

## 2021-01-16 NOTE — Progress Notes (Signed)
01/16/2021   Endocrinology follow-up note    Subjective:    [name], male    DOB: 08/16/1965.  he is being seen in follow-up after he was seen in consultation for  management of currently uncontrolled symptomatic uncontrolled type 2 diabetes, hyperlipidemia, hypertension.  PMD:   Elfredia Nevins, MD.   Past Medical History:  Diagnosis Date   CAD (coronary artery disease), native coronary artery    10/18 PCI/DES mLAD, normal EF on LV gram   Diabetes mellitus    Type 2 diagnosed 2 weeks ago   Hiatal hernia with gastroesophageal reflux    Hypertension    Mixed hyperlipidemia    Morbid obesity (HCC)    OSA on CPAP    Urinary frequency    Past Surgical History:  Procedure Laterality Date   CIRCUMCISION     1988   CORONARY STENT INTERVENTION N/A 03/16/2017   Procedure: CORONARY STENT INTERVENTION;  Surgeon: Lyn Records, MD;  Location: [facility];  Service: Cardiovascular;  Laterality: N/A;   ESOPHAGEAL DILATION N/A 02/08/2015   Procedure: ESOPHAGEAL DILATION;  Surgeon: Malissa Hippo, MD;  Location: [facility];  Service: Endoscopy;  Laterality: N/A;   ESOPHAGOGASTRODUODENOSCOPY N/A 02/08/2015   Procedure: ESOPHAGOGASTRODUODENOSCOPY (EGD);  Surgeon: Malissa Hippo, MD;  Location: [facility];  Service: Endoscopy;  Laterality: N/A;  1055   ESOPHAGOGASTRODUODENOSCOPY (EGD) WITH ESOPHAGEAL DILATION  03/29/2012   Procedure: ESOPHAGOGASTRODUODENOSCOPY (EGD) WITH ESOPHAGEAL DILATION;  Surgeon: Malissa Hippo, MD;  Location: [facility];  Service: Endoscopy;  Laterality: N/A;  730   LEFT HEART CATH AND CORONARY ANGIOGRAPHY N/A 03/16/2017   Procedure: LEFT HEART CATH AND CORONARY ANGIOGRAPHY;  Surgeon: Lyn Records, MD;  Location: [facility];  Service: Cardiovascular;  Laterality: N/A;   ULTRASOUND GUIDANCE FOR VASCULAR ACCESS  03/16/2017   Procedure: Ultrasound Guidance For Vascular Access;  Surgeon: Lyn Records, MD;  Location: [facility]  INVASIVE CV [facility];  Service: Cardiovascular;;   Social History   Socioeconomic History   Marital status: Married    Spouse name: Not on file   Number of children: Not on file   Years of education: Not on file   Highest education level: Not on file  Occupational History   Not on file  Tobacco Use   Smoking status: Never   Smokeless tobacco: Never  Vaping Use   Vaping Use: Never used  Substance and Sexual Activity   Alcohol use: No   Drug use: No   Sexual activity: Not on file  Other Topics Concern   Not on file  Social History Narrative   Drinks caffeine 4 times a week.      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   Social Determinants of Health   Financial Resource Strain: Not on file  Food Insecurity: Not on file  Transportation Needs: Not on file  Physical Activity: Not on file  Stress: Not on file  Social Connections: Not on file   Outpatient Encounter Medications as of 01/16/2021  Medication Sig   aspirin 81 MG chewable tablet Chew 1 tablet (81 mg total) by mouth daily.   atorvastatin (LIPITOR) 40 MG tablet TAKE 1 TABLET BY MOUTH ONCE DAILY AT  6  PM   Cholecalciferol (VITAMIN D3) 125 MCG (5000 UT) CAPS Take 1 capsule (5,000 Units total) by mouth daily.   cyclobenzaprine (FLEXERIL) 10 MG tablet Take 1 tablet (10 mg total) by mouth 2 (two) times daily as needed  for muscle spasms.   glipiZIDE (GLUCOTROL XL) 5 MG 24 hr tablet Take 1 tablet by mouth once daily with breakfast   ibuprofen (ADVIL) 800 MG tablet Take 800 mg by mouth 3 (three) times daily.   insulin glargine, 1 Unit Dial, (TOUJEO SOLOSTAR) 300 UNIT/ML Solostar Pen Inject 50 Units into the skin at bedtime.   Insulin Pen Needle (B-D ULTRAFINE III SHORT PEN) 31G X 8 MM MISC USE 1 PEN NEEDLE DAILY AS DIRECTED   JANUMET XR 50-1000 MG TB24 TAKE 1 TABLET BY MOUTH AFTER BREAKFAST   lisinopril (ZESTRIL) 20 MG tablet Take 1 tablet (20 mg total) by mouth daily.   Melatonin 2.5 MG CAPS Take 2.5 mg by mouth at bedtime as needed (sleep).    nitroGLYCERIN (NITROSTAT) 0.4 MG SL tablet Place 1 tablet (0.4 mg total) under the tongue every 5 (five) minutes as needed.   sildenafil (VIAGRA) 100 MG tablet Take 100 mg by mouth as needed.   [DISCONTINUED] lisinopril (ZESTRIL) 10 MG tablet Take 1 tablet by mouth once daily   [DISCONTINUED] TOUJEO SOLOSTAR 300 UNIT/ML Solostar Pen INJECT 60 UNITS SUBCUTANEOUSLY AT BEDTIME   No facility-administered encounter medications on file as of 01/16/2021.    ALLERGIES: Allergies  Allergen Reactions   Bee Venom    Shellfish Allergy Swelling    Turns red   Strawberry Extract Rash    VACCINATION STATUS: Immunization History  Administered Date(s) Administered   Moderna Sars-Covid-2 Vaccination 08/24/2019, 09/27/2019    Diabetes He presents for his follow-up diabetic visit. He has type 2 diabetes mellitus. Onset time: He reports that he was diagnosed at approximate age of 45 years. His disease course has been improving. There are no hypoglycemic associated symptoms. Pertinent negatives for hypoglycemia include no confusion, headaches, pallor, seizures or tremors. Associated symptoms include polydipsia and polyuria. Pertinent negatives for diabetes include no blurred vision, no chest pain, no fatigue, no polyphagia and no weakness. There are no hypoglycemic complications. Symptoms are improving. Diabetic complications include heart disease. Risk factors for coronary artery disease include dyslipidemia, diabetes mellitus, family history, hypertension, male sex, obesity and sedentary lifestyle. Current diabetic treatment includes oral agent (dual therapy). His weight is fluctuating minimally. He is following a generally unhealthy diet. When asked about meal planning, he reported none. He has not had a previous visit with a dietitian. He participates in exercise intermittently. His home blood glucose trend is decreasing steadily. His breakfast blood glucose range is generally 140-180 mg/dl. His bedtime  blood glucose range is generally 180-200 mg/dl. His overall blood glucose range is 140-180 mg/dl. (He presents with his meter and logs showing controlled glycemic profile.  His point-of-care A1c 7.2% improving from 8.3% during his last visit.  Has no major hypoglycemia.    ) An ACE inhibitor/angiotensin II receptor blocker is being taken. He does not see a podiatrist.Eye exam is not current.  Hyperlipidemia This is a chronic problem. The current episode started more than 1 year ago. The problem is controlled. Exacerbating diseases include diabetes and obesity. Pertinent negatives include no chest pain, myalgias or shortness of breath. Current antihyperlipidemic treatment includes statins. Risk factors for coronary artery disease include dyslipidemia, diabetes mellitus, hypertension, male sex, obesity, a sedentary lifestyle and family history.  Hypertension This is a chronic problem. The problem is controlled. Pertinent negatives include no blurred vision, chest pain, headaches, neck pain, palpitations or shortness of breath. Risk factors for coronary artery disease include diabetes mellitus, dyslipidemia, obesity, male gender, sedentary lifestyle and family history. Past  treatments include ACE inhibitors. Hypertensive end-organ damage includes CAD/MI.    Review of systems Limited as above.   Objective:    BP (!) 156/97   Pulse 84   Ht 5\' 11"  (1.803 m)   Wt (!) 314 lb 3.2 oz (142.5 kg)   BMI 43.82 kg/m   Wt Readings from Last 3 Encounters:  01/16/21 (!) 314 lb 3.2 oz (142.5 kg)  09/11/20 (!) 310 lb 9.6 oz (140.9 kg)  06/04/20 (!) 316 lb 6.4 oz (143.5 kg)      Physical Exam- Limited  Constitutional:  Body mass index is 43.82 kg/m. , not in acute distress, normal state of mind   CMP ( most recent) CMP     Component Value Date/Time   NA 142 09/09/2020 0854   K 4.6 09/09/2020 0854   CL 100 09/09/2020 0854   CO2 27 09/09/2020 0854   GLUCOSE 189 (H) 09/09/2020 0854   GLUCOSE 404  (H) 01/20/2020 0556   BUN 13 09/09/2020 0854   CREATININE 1.14 09/09/2020 0854   CREATININE 0.93 10/12/2019 0833   CALCIUM 9.6 09/09/2020 0854   PROT 7.4 09/09/2020 0854   ALBUMIN 4.4 09/09/2020 0854   AST 16 09/09/2020 0854   ALT 25 09/09/2020 0854   ALKPHOS 95 09/09/2020 0854   BILITOT 0.5 09/09/2020 0854   GFRNONAA >60 01/20/2020 0556   GFRNONAA 95 10/12/2019 0833   GFRAA >60 01/20/2020 0556   GFRAA 110 10/12/2019 0833     Diabetic Labs (most recent): Lab Results  Component Value Date   HGBA1C 7.2 (A) 01/16/2021   HGBA1C 8.3 (A) 09/11/2020   HGBA1C 7.1 (A) 06/04/2020     Lipid Panel ( most recent) Lipid Panel     Component Value Date/Time   CHOL 154 09/09/2020 0854   TRIG 76 09/09/2020 0854   HDL 48 09/09/2020 0854   CHOLHDL 3.2 09/09/2020 0854   CHOLHDL 3.0 10/12/2019 0833   VLDL 21 06/29/2017 0807   LDLCALC 91 09/09/2020 0854   LDLCALC 64 10/12/2019 0833     Assessment & Plan:   1. DM type 2 causing vascular disease (HCC) - Patient has currently uncontrolled symptomatic type 2 DM since  55 years of age.  He presents with his meter and logs showing controlled glycemic profile.  His point-of-care A1c 7.2% improving from 8.3% during his last visit.  Has no major hypoglycemia.     Recent labs reviewed with him showing normal renal function.  -his diabetes is complicated by coronary artery disease with stent placement on 03/15/2017 and Joel Ward remains at extremely  high risk for more acute and chronic complications which include CAD, CVA, CKD, retinopathy, and neuropathy. These are all discussed in detail with the patient.  - I have counseled him on diet management and weight loss, by adopting a carbohydrate restricted/protein rich diet. -He did admits to dietary indiscretions including consumption of sweets and sweetened beverages.     - he acknowledges that there is a room for improvement in his food and drink choices. - Suggestion is made for him to  avoid simple carbohydrates  from his diet including Cakes, Sweet Desserts, Ice Cream, Soda (diet and regular), Sweet Tea, Candies, Chips, Cookies, Store Bought Juices, Alcohol in Excess of  1-2 drinks a day, Artificial Sweeteners,  Coffee Creamer, and "Sugar-free" Products, Lemonade. This will help patient to have more stable blood glucose profile and potentially avoid unintended weight gain.  - I encouraged him to switch to  unprocessed  or minimally processed complex starch and increased protein intake (animal or plant source), fruits, and vegetables.  - he is advised to stick to a routine mealtimes to eat 3 meals  a day and avoid unnecessary snacks ( to snack only to correct hypoglycemia).   - he has been  scheduled with Norm Salt, RDN, CDE for individualized DM education- consult pending.  - I have approached him with the following individualized plan to manage diabetes and patient agrees:   -In light of his presentation with improved A1c of 7.2%, his approach for lower dose of basal insulin.   -He is advised to lower her Toujeo to 50 units nightly,  continue to monitor blood glucose twice a day-daily before breakfast and at bedtime.  -He is advised to continue Janumet 50/1000 mg ER once a day after breakfast. -He is encouraged to call clinic for hypoglycemia below 70 and hyperglycemia above 200 mg/dL. -He is advised to continue glipizide 5 mg XL p.o. daily at breakfast.     2) BP/HTN: -His blood pressures not controlled to target.  He is advised to increase his lisinopril to 20 mg p.o. daily at breakfast.    3) Lipids/HPL: His recent lipid panel showed controlled LDL at 64.  He is advised to continue atorvastatin 40 mg p.o. nightly.  Side effects and precautions discussed with him.     4)  Weight/Diet: His current BMI is 43.8-- clearly complicating his diabetes care.  He is a candidate for modest weight loss.   I discussed with him the fact that he would benefit the most from loss  of 5 - 10% of his current body weight .  To achieve his he has to stick to the dietary and exercise regimen provided to him and will benefit from bariatric surgery.     CDE Consult has been initiated , exercise, and detailed carbohydrates information provided.  I discussed and recommended bariatric surgery with him, he is given brochure on bariatric surgery.  5) vitamin D deficiency: He is on ongoing supplement with vitamin D 2 50,000 units weekly.   He will have repeat 25-hydroxy vitamin D measured before his next visit.  6) Chronic Care/Health Maintenance:  -he  is on ACEI/ARB and Statin medications and  is encouraged to continue to follow up with Ophthalmology, Dentist,  Podiatrist at least yearly or according to recommendations, and advised to  stay away from smoking. I have recommended yearly flu vaccine and pneumonia vaccination at least every 5 years; moderate intensity exercise for up to 150 minutes weekly; and  sleep for at least 7 hours a day.  He had negative ABI for PAD in April 2022.  His study will be repeated in April 2027, or sooner if needed.   - I advised patient to maintain close follow up with Elfredia Nevins, MD for primary care needs.    I spent 34 minutes in the care of the patient today including review of labs from CMP, Lipids, Thyroid Function, Hematology (current and previous including abstractions from other facilities); face-to-face time discussing  his blood glucose readings/logs, discussing hypoglycemia and hyperglycemia episodes and symptoms, medications doses, his options of short and long term treatment based on the latest standards of care / guidelines;  discussion about incorporating lifestyle medicine;  and documenting the encounter.    Please refer to Patient Instructions for Blood Glucose Monitoring and Insulin/Medications Dosing Guide"  in media tab for additional information. Please  also refer to " Patient Self Inventory" in the  Media  tab for reviewed  elements of pertinent patient history.  Joel FossaFrederick J Delaurentis participated in the discussions, expressed understanding, and voiced agreement with the above plans.  All questions were answered to his satisfaction. he is encouraged to contact clinic should he have any questions or concerns prior to his return visit.  Follow up plan: - Return in about 4 months (around 05/18/2021) for F/U with Pre-visit Labs, Meter, Logs, A1c here.Joel Ward.  Joel Irean Kendricks, MD Phone: [phone]  Fax: [phone]   01/16/2021, 12:27 PM This note was partially dictated with voice recognition software. Similar sounding words can be transcribed inadequately or may not  be corrected upon review.

## 2021-01-21 ENCOUNTER — Telehealth: Payer: Self-pay

## 2021-01-21 NOTE — Telephone Encounter (Signed)
Pt called with high BG readings.   Date Before breakfast Before lunch Before supper Bedtime  8/20 81   138  8/21 267   194  8/22 200   196  8/23 187       Pt taking: Glipizide 5mg  daily, janumet 50-1000mg  and just recently changed toujeo to 50 units at bedtime at last visit. 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

## 2021-01-21 NOTE — Telephone Encounter (Signed)
Pt states that he called the after hours line last night regarding his sugar. He would like a call back in regards to this. Thank you

## 2021-01-21 NOTE — Telephone Encounter (Signed)
Discussed with pt, understanding voiced. 

## 2021-02-01 ENCOUNTER — Other Ambulatory Visit: Payer: Self-pay | Admitting: "Endocrinology

## 2021-03-08 ENCOUNTER — Other Ambulatory Visit: Payer: Self-pay | Admitting: "Endocrinology

## 2021-03-10 ENCOUNTER — Other Ambulatory Visit: Payer: Self-pay | Admitting: "Endocrinology

## 2021-04-24 ENCOUNTER — Other Ambulatory Visit: Payer: Self-pay | Admitting: "Endocrinology

## 2021-05-07 ENCOUNTER — Other Ambulatory Visit: Payer: Self-pay | Admitting: "Endocrinology

## 2021-05-30 ENCOUNTER — Ambulatory Visit: Payer: BC Managed Care – PPO | Admitting: "Endocrinology

## 2021-06-02 DIAGNOSIS — E042 Nontoxic multinodular goiter: Secondary | ICD-10-CM | POA: Diagnosis not present

## 2021-06-02 DIAGNOSIS — I10 Essential (primary) hypertension: Secondary | ICD-10-CM | POA: Diagnosis not present

## 2021-06-02 DIAGNOSIS — I25110 Atherosclerotic heart disease of native coronary artery with unstable angina pectoris: Secondary | ICD-10-CM | POA: Diagnosis not present

## 2021-06-02 DIAGNOSIS — E559 Vitamin D deficiency, unspecified: Secondary | ICD-10-CM | POA: Diagnosis not present

## 2021-06-04 ENCOUNTER — Encounter: Payer: Self-pay | Admitting: "Endocrinology

## 2021-06-04 ENCOUNTER — Ambulatory Visit: Payer: BC Managed Care – PPO | Admitting: "Endocrinology

## 2021-06-04 ENCOUNTER — Other Ambulatory Visit: Payer: Self-pay

## 2021-06-04 VITALS — BP 140/92 | HR 76 | Ht 71.0 in | Wt 316.8 lb

## 2021-06-04 DIAGNOSIS — E782 Mixed hyperlipidemia: Secondary | ICD-10-CM | POA: Diagnosis not present

## 2021-06-04 DIAGNOSIS — E559 Vitamin D deficiency, unspecified: Secondary | ICD-10-CM

## 2021-06-04 DIAGNOSIS — I10 Essential (primary) hypertension: Secondary | ICD-10-CM | POA: Diagnosis not present

## 2021-06-04 DIAGNOSIS — E1159 Type 2 diabetes mellitus with other circulatory complications: Secondary | ICD-10-CM

## 2021-06-04 LAB — HEMOGLOBIN A1C: Hemoglobin A1C: 11.2

## 2021-06-04 MED ORDER — FREESTYLE LIBRE 3 SENSOR MISC: 1.0000 | 2 refills | Status: DC

## 2021-06-04 MED ORDER — TOUJEO SOLOSTAR 300 UNIT/ML ~~LOC~~ SOPN: 70.0000 [IU] | PEN_INJECTOR | Freq: Every day | SUBCUTANEOUS | 2 refills | Status: DC

## 2021-06-04 NOTE — Progress Notes (Signed)
06/04/2021   Endocrinology follow-up note    Subjective:    [name], male    DOB: [birth year].  he is being seen in follow-up after he was seen in consultation for  management of currently uncontrolled symptomatic uncontrolled type 2 diabetes, hyperlipidemia, hypertension.  PMD:   Elfredia NevinsFusco, Lawrence, MD.   Past Medical History:  Diagnosis Date   CAD (coronary artery disease), native coronary artery    10/18 PCI/DES mLAD, normal EF on LV gram   Diabetes mellitus    Type 2 diagnosed 2 weeks ago   Hiatal hernia with gastroesophageal reflux    Hypertension    Mixed hyperlipidemia    Morbid obesity (HCC)    OSA on CPAP    Urinary frequency    Past Surgical History:  Procedure Laterality Date   CIRCUMCISION     1988   CORONARY STENT INTERVENTION N/A 03/16/2017   Procedure: CORONARY STENT INTERVENTION;  Surgeon: Lyn RecordsSmith, Henry W, MD;  Location: [facility];  Service: Cardiovascular;  Laterality: N/A;   ESOPHAGEAL DILATION N/A 02/08/2015   Procedure: ESOPHAGEAL DILATION;  Surgeon: Malissa HippoNajeeb U [name], MD;  Location: [facility];  Service: Endoscopy;  Laterality: N/A;   ESOPHAGOGASTRODUODENOSCOPY N/A 02/08/2015   Procedure: ESOPHAGOGASTRODUODENOSCOPY (EGD);  Surgeon: Malissa HippoNajeeb U [name], MD;  Location: [facility];  Service: Endoscopy;  Laterality: N/A;  1055   ESOPHAGOGASTRODUODENOSCOPY (EGD) WITH ESOPHAGEAL DILATION  03/29/2012   Procedure: ESOPHAGOGASTRODUODENOSCOPY (EGD) WITH ESOPHAGEAL DILATION;  Surgeon: Malissa HippoNajeeb U [name], MD;  Location: [facility];  Service: Endoscopy;  Laterality: N/A;  730   LEFT HEART CATH AND CORONARY ANGIOGRAPHY N/A 03/16/2017   Procedure: LEFT HEART CATH AND CORONARY ANGIOGRAPHY;  Surgeon: Lyn RecordsSmith, Henry W, MD;  Location: [facility];  Service: Cardiovascular;  Laterality: N/A;   ULTRASOUND GUIDANCE FOR VASCULAR ACCESS  03/16/2017   Procedure: Ultrasound Guidance For Vascular Access;  Surgeon: Lyn RecordsSmith, Henry W, MD;  [facility];  Service: Cardiovascular;;   Social History   Socioeconomic History   Marital status: Married    Spouse name: Not on file   Number of children: Not on file   Years of education: Not on file   Highest education level: Not on file  Occupational History   Not on file  Tobacco Use   Smoking status: Never   Smokeless tobacco: Never  Vaping Use   Vaping Use: Never used  Substance and Sexual Activity   Alcohol use: No   Drug use: No   Sexual activity: Not on file  Other Topics Concern   Not on file  Social History Narrative   Drinks caffeine 4 times a week.      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   Social Determinants of Health   Financial Resource Strain: Not on file  Food Insecurity: Not on file  Transportation Needs: Not on file  Physical Activity: Not on file  Stress: Not on file  Social Connections: Not on file   Outpatient Encounter Medications as of 06/04/2021  Medication Sig   Continuous Blood Gluc Sensor (FREESTYLE LIBRE 3 SENSOR) MISC 1 Piece by Does not apply route every 14 (fourteen) days. Place 1 sensor on the skin every 14 days. Use to check glucose continuously   aspirin 81 MG chewable tablet Chew 1 tablet (81 mg total) by mouth daily.   atorvastatin (LIPITOR) 40 MG tablet TAKE 1 TABLET BY MOUTH ONCE DAILY AT  6  PM   Cholecalciferol (VITAMIN  D3) 125 MCG (5000 UT) CAPS Take 1 capsule (5,000 Units total) by mouth daily.   cyclobenzaprine (FLEXERIL) 10 MG tablet Take 1 tablet (10 mg total) by mouth 2 (two) times daily as needed for muscle spasms.   glipiZIDE (GLUCOTROL XL) 5 MG 24 hr tablet Take 1 tablet by mouth once daily with breakfast   ibuprofen (ADVIL) 800 MG tablet Take 800 mg by mouth 3 (three) times daily.   insulin glargine, 1 Unit Dial, (TOUJEO SOLOSTAR) 300 UNIT/ML Solostar Pen Inject 70 Units into the skin at bedtime.   Insulin Pen Needle (B-D ULTRAFINE III SHORT PEN) 31G X 8 MM MISC USE 1 PEN NEEDLE DAILY AS DIRECTED   JANUMET XR 50-1000 MG  TB24 TAKE 1 TABLET BY MOUTH AFTER BREAKFAST   lisinopril (ZESTRIL) 20 MG tablet Take 1 tablet (20 mg total) by mouth daily.   Melatonin 2.5 MG CAPS Take 2.5 mg by mouth at bedtime as needed (sleep).   nitroGLYCERIN (NITROSTAT) 0.4 MG SL tablet Place 1 tablet (0.4 mg total) under the tongue every 5 (five) minutes as needed.   sildenafil (VIAGRA) 100 MG tablet Take 100 mg by mouth as needed.   [DISCONTINUED] TOUJEO SOLOSTAR 300 UNIT/ML Solostar Pen INJECT 50 UNITS SUBCUTANEOUSLY AT BEDTIME (Patient taking differently: 60 Units.)   No facility-administered encounter medications on file as of 06/04/2021.    ALLERGIES: Allergies  Allergen Reactions   Bee Venom    Shellfish Allergy Swelling    Turns red   Strawberry Extract Rash    VACCINATION STATUS: Immunization History  Administered Date(s) Administered   Moderna Sars-Covid-2 Vaccination 08/24/2019, 09/27/2019    Diabetes He presents for his follow-up diabetic visit. He has type 2 diabetes mellitus. Onset time: He reports that he was diagnosed at approximate age of 56 years. His disease course has been worsening. There are no hypoglycemic associated symptoms. Pertinent negatives for hypoglycemia include no confusion, headaches, pallor, seizures or tremors. Associated symptoms include polydipsia and polyuria. Pertinent negatives for diabetes include no blurred vision, no chest pain, no fatigue, no polyphagia and no weakness. There are no hypoglycemic complications. Symptoms are worsening. Diabetic complications include heart disease. Risk factors for coronary artery disease include dyslipidemia, diabetes mellitus, family history, hypertension, male sex, obesity and sedentary lifestyle. Current diabetic treatment includes insulin injections (Is currently on Toujeo 60 units nightly, Janumet 50/1000 mg p.o. daily, glipizide 5 g p.o. daily.). His weight is increasing steadily. He is following a generally unhealthy diet. When asked about meal  planning, he reported none. He has not had a previous visit with a dietitian. He participates in exercise intermittently. His home blood glucose trend is increasing steadily. His breakfast blood glucose range is generally 180-200 mg/dl. His bedtime blood glucose range is generally >200 mg/dl. His overall blood glucose range is 140-180 mg/dl. Joel Ward returns with worsening glycemic profile.  He stopped monitoring blood glucose and interested in getting a CGM device.  His point-of-care A1c is 11.2%.  His logs from prior to 2 weeks ago showed consistently above target glycemic profile.    ) An ACE inhibitor/angiotensin II receptor blocker is being taken. He does not see a podiatrist.Eye exam is not current.  Hyperlipidemia This is a chronic problem. The current episode started more than 1 year ago. The problem is controlled. Exacerbating diseases include diabetes and obesity. Pertinent negatives include no chest pain, myalgias or shortness of breath. Current antihyperlipidemic treatment includes statins. Risk factors for coronary artery disease include dyslipidemia, diabetes mellitus, hypertension, male sex,  obesity, a sedentary lifestyle and family history.  Hypertension This is a chronic problem. The problem is controlled. Pertinent negatives include no blurred vision, chest pain, headaches, neck pain, palpitations or shortness of breath. Risk factors for coronary artery disease include diabetes mellitus, dyslipidemia, obesity, male gender, sedentary lifestyle and family history. Past treatments include ACE inhibitors. Hypertensive end-organ damage includes CAD/MI.    Review of systems Limited as above.   Objective:    BP (!) 140/92    Pulse 76    Ht 5\' 11"  (1.803 m)    Wt (!) 316 lb 12.8 oz (143.7 kg)    BMI 44.18 kg/m   Wt Readings from Last 3 Encounters:  06/04/21 (!) 316 lb 12.8 oz (143.7 kg)  01/16/21 (!) 314 lb 3.2 oz (142.5 kg)  09/11/20 (!) 310 lb 9.6 oz (140.9 kg)      Physical  Exam- Limited  Constitutional:  Body mass index is 44.18 kg/m. , not in acute distress, normal state of mind   CMP ( most recent) CMP     Component Value Date/Time   NA 142 09/09/2020 0854   K 4.6 09/09/2020 0854   CL 100 09/09/2020 0854   CO2 27 09/09/2020 0854   GLUCOSE 189 (H) 09/09/2020 0854   GLUCOSE 404 (H) 01/20/2020 0556   BUN 13 09/09/2020 0854   CREATININE 1.14 09/09/2020 0854   CREATININE 0.93 10/12/2019 0833   CALCIUM 9.6 09/09/2020 0854   PROT 7.4 09/09/2020 0854   ALBUMIN 4.4 09/09/2020 0854   AST 16 09/09/2020 0854   ALT 25 09/09/2020 0854   ALKPHOS 95 09/09/2020 0854   BILITOT 0.5 09/09/2020 0854   GFRNONAA >60 01/20/2020 0556   GFRNONAA 95 10/12/2019 0833   GFRAA >60 01/20/2020 0556   GFRAA 110 10/12/2019 0833     Diabetic Labs (most recent): Lab Results  Component Value Date   HGBA1C 11.2 06/04/2021   HGBA1C 7.2 (A) 01/16/2021   HGBA1C 8.3 (A) 09/11/2020     Lipid Panel ( most recent) Lipid Panel     Component Value Date/Time   CHOL 154 09/09/2020 0854   TRIG 76 09/09/2020 0854   HDL 48 09/09/2020 0854   CHOLHDL 3.2 09/09/2020 0854   CHOLHDL 3.0 10/12/2019 0833   VLDL 21 06/29/2017 0807   LDLCALC 91 09/09/2020 0854   LDLCALC 64 10/12/2019 0833     Assessment & Plan:   1. DM type 2 causing vascular disease (HCC) - Patient has currently uncontrolled symptomatic type 2 DM since  56 years of age.  Joel Ward returns with worsening glycemic profile.  He stopped monitoring blood glucose and interested in getting a CGM device.  His point-of-care A1c is 11.2%.  His logs from prior to 2 weeks ago showed consistently above target glycemic profile.     Recent labs reviewed with him showing normal renal function.  -his diabetes is complicated by coronary artery disease with stent placement on 03/15/2017 and Joel Ward remains at extremely  high risk for more acute and chronic complications which include CAD, CVA, CKD, retinopathy, and  neuropathy. These are all discussed in detail with the patient.  - I have counseled him on diet management and weight loss, by adopting a carbohydrate restricted/protein rich diet. -He did admits to dietary indiscretions including consumption of sweets and sweetened beverages.     - he acknowledges that there is a room for improvement in his food and drink choices. - Suggestion is made for him to avoid simple  carbohydrates  from his diet including Cakes, Sweet Desserts, Ice Cream, Soda (diet and regular), Sweet Tea, Candies, Chips, Cookies, Store Bought Juices, Alcohol , Artificial Sweeteners,  Coffee Creamer, and "Sugar-free" Products, Lemonade. This will help patient to have more stable blood glucose profile and potentially avoid unintended weight gain.  The following Lifestyle Medicine recommendations according to American College of Lifestyle Medicine  [facility]) were discussed and and offered to patient and he  agrees to start the journey:  A. Whole Foods, Plant-Based Nutrition comprising of fruits and vegetables, plant-based proteins, whole-grain carbohydrates was discussed in detail with the patient.   A list for source of those nutrients were also provided to the patient.  Patient will use only water or unsweetened tea for hydration. B.  The need to stay away from risky substances including alcohol, smoking; obtaining 7 to 9 hours of restorative sleep, at least 150 minutes of moderate intensity exercise weekly, the importance of healthy social connections,  and stress management techniques were discussed. C.  A full color page of  Calorie density of various food groups per pound showing examples of each food groups was provided to the patient.    - I encouraged him to switch to  unprocessed or minimally processed complex starch and increased protein intake (animal or plant source), fruits, and vegetables.  - he is advised to stick to a routine mealtimes to eat 3 meals  a day and avoid  unnecessary snacks ( to snack only to correct hypoglycemia).   - he has been  scheduled with Norm Salt, RDN, CDE for individualized DM education- consult pending.  - I have approached him with the following individualized plan to manage diabetes and patient agrees:   -In light of his presentation with loss of control of diabetes with A1c of 11.2%, he is also approached for a higher dose of insulin.   -He may need multiple daily injections of insulin.  In preparation, he is approached to start monitoring blood glucose 4 times a day-before meals and at bedtime and return in 2-4 weeks for reevaluation. -In the meantime, he is advised to increase Toujeo to 70 units nightly,  continue Janumet 50/1000 mg ER once a day after breakfast. -He is encouraged to call clinic for hypoglycemia below 70 and hyperglycemia above 200 mg/dL. -He is advised to continue glipizide 5 mg XL p.o. daily at breakfast.     2) BP/HTN: -His blood pressures not controlled to target.  He is advised to increase his lisinopril to 20 mg p.o. daily at breakfast.    3) Lipids/HPL: His recent lipid panel showed controlled LDL at 64.  He is advised to continue atorvastatin 40 mg p.o. nightly.  Side effects and precautions discussed with him.     4)  Weight/Diet: His current BMI is 44.18--- clearly complicating his diabetes care.  He is a candidate for modest weight loss.   I discussed with him the fact that he would benefit the most from loss of 5 - 10% of his current body weight .  To achieve his he has to stick to the dietary and exercise regimen provided to him and will benefit from bariatric surgery.     CDE Consult has been initiated , exercise, and detailed carbohydrates information provided.  I discussed and recommended bariatric surgery with him, he is given brochure on bariatric surgery.  5) vitamin D deficiency: He still has profound vitamin D deficiency at 60. He is on ongoing supplement with vitamin D 2  50,000  units weekly.   He will have repeat 25-hydroxy vitamin D measured before his next visit.  6) Chronic Care/Health Maintenance:  -he  is on ACEI/ARB and Statin medications and  is encouraged to continue to follow up with Ophthalmology, Dentist,  Podiatrist at least yearly or according to recommendations, and advised to  stay away from smoking. I have recommended yearly flu vaccine and pneumonia vaccination at least every 5 years; moderate intensity exercise for up to 150 minutes weekly; and  sleep for at least 7 hours a day.  He had negative ABI for PAD in April 2022.  His study will be repeated in April 2027, or sooner if needed.   - I advised patient to maintain close follow up with Elfredia NevinsFusco, Lawrence, MD for primary care needs.   I spent 43 minutes in the care of the patient today including review of labs from CMP, Lipids, Thyroid Function, Hematology (current and previous including abstractions from other facilities); face-to-face time discussing  his blood glucose readings/logs, discussing hypoglycemia and hyperglycemia episodes and symptoms, medications doses, his options of short and long term treatment based on the latest standards of care / guidelines;  discussion about incorporating lifestyle medicine;  and documenting the encounter.    Please refer to Patient Instructions for Blood Glucose Monitoring and Insulin/Medications Dosing Guide"  in media tab for additional information. Please  also refer to " Patient Self Inventory" in the Media  tab for reviewed elements of pertinent patient history.  Joel Ward participated in the discussions, expressed understanding, and voiced agreement with the above plans.  All questions were answered to his satisfaction. he is encouraged to contact clinic should he have any questions or concerns prior to his return visit.   Follow up plan: - Return in about 2 weeks (around 06/18/2021) for F/U with Meter and Logs Only - no Labs.  Marquis LunchGebre Jarid Sasso,  MD Phone: [phone]  Fax: [phone]   06/04/2021, 12:06 PM This note was partially dictated with voice recognition software. Similar sounding words can be transcribed inadequately or may not  be corrected upon review.

## 2021-06-04 NOTE — Patient Instructions (Signed)

## 2021-06-06 ENCOUNTER — Other Ambulatory Visit: Payer: Self-pay

## 2021-06-06 ENCOUNTER — Telehealth: Payer: Self-pay | Admitting: "Endocrinology

## 2021-06-06 MED ORDER — FREESTYLE LIBRE 14 DAY READER DEVI: 1 refills | Status: DC

## 2021-06-06 NOTE — Telephone Encounter (Signed)
Please send to [facility]

## 2021-06-06 NOTE — Telephone Encounter (Signed)
Joel Ward from Joel Ward called and states patient reached out wanting to know how he could get a [name] or [name] covered and she states it is not covered through his pharmacy plan but through his medical they will cover if it is sent through [facility].

## 2021-06-06 NOTE — Telephone Encounter (Signed)
Joel Ward has been sent to the [place] for the patient. If he calls back and it is not covered he needs to let us know if he wants [place] to send to [facility].

## 2021-06-06 NOTE — Telephone Encounter (Signed)
Dr. Fransico Him has sent the [facility] sensors to the patients pharmacy.  I called the patient and his voice mail was full and I was unable to leave a detailed voice message. I need to know if he wants me to send the [facility] reader to [facility] if the sensors are covered or if he wants me to send these prescriptions to [facility] for the patient. Hopefully he will call back. I can also try again at a later time.

## 2021-06-12 ENCOUNTER — Other Ambulatory Visit: Payer: Self-pay | Admitting: "Endocrinology

## 2021-06-12 LAB — COMPREHENSIVE METABOLIC PANEL
ALT: 27 IU/L (ref 0–44)
AST: 20 IU/L (ref 0–40)
Albumin/Globulin Ratio: 1.3 (ref 1.2–2.2)
Albumin: 4.3 g/dL (ref 3.8–4.9)
Alkaline Phosphatase: 88 IU/L (ref 44–121)
BUN/Creatinine Ratio: 8 — ABNORMAL LOW (ref 9–20)
BUN: 8 mg/dL (ref 6–24)
Bilirubin Total: 0.3 mg/dL (ref 0.0–1.2)
CO2: 26 mmol/L (ref 20–29)
Calcium: 9.4 mg/dL (ref 8.7–10.2)
Chloride: 97 mmol/L (ref 96–106)
Creatinine, Ser: 1.05 mg/dL (ref 0.76–1.27)
Globulin, Total: 3.2 g/dL (ref 1.5–4.5)
Glucose: 248 mg/dL — ABNORMAL HIGH (ref 70–99)
Potassium: 4.5 mmol/L (ref 3.5–5.2)
Sodium: 137 mmol/L (ref 134–144)
Total Protein: 7.5 g/dL (ref 6.0–8.5)
eGFR: 85 mL/min/{1.73_m2} (ref >59)

## 2021-06-12 LAB — TSH: TSH: 1.68 u[IU]/mL (ref 0.450–4.500)

## 2021-06-12 LAB — VITAMIN D 25 HYDROXY (VIT D DEFICIENCY, FRACTURES): Vit D, 25-Hydroxy: 15.5 ng/mL — ABNORMAL LOW (ref 30.0–100.0)

## 2021-06-12 LAB — T4, FREE: Free T4: 1.1 ng/dL (ref 0.82–1.77)

## 2021-06-20 ENCOUNTER — Other Ambulatory Visit: Payer: Self-pay

## 2021-06-20 ENCOUNTER — Encounter: Payer: Self-pay | Admitting: "Endocrinology

## 2021-06-20 ENCOUNTER — Ambulatory Visit: Payer: BC Managed Care – PPO | Admitting: "Endocrinology

## 2021-06-20 VITALS — BP 128/86 | HR 84 | Ht 71.0 in | Wt 314.2 lb

## 2021-06-20 DIAGNOSIS — E1159 Type 2 diabetes mellitus with other circulatory complications: Secondary | ICD-10-CM

## 2021-06-20 DIAGNOSIS — I10 Essential (primary) hypertension: Secondary | ICD-10-CM

## 2021-06-20 DIAGNOSIS — E782 Mixed hyperlipidemia: Secondary | ICD-10-CM | POA: Diagnosis not present

## 2021-06-20 DIAGNOSIS — E559 Vitamin D deficiency, unspecified: Secondary | ICD-10-CM | POA: Diagnosis not present

## 2021-06-20 MED ORDER — TOUJEO SOLOSTAR 300 UNIT/ML ~~LOC~~ SOPN: 70.0000 [IU] | PEN_INJECTOR | Freq: Every day | SUBCUTANEOUS | 2 refills | Status: DC

## 2021-06-20 NOTE — Progress Notes (Signed)
06/20/2021   Endocrinology follow-up note    Subjective:    [name], male    DOB: 01/20/1966.  he is being seen in follow-up after he was seen in consultation for  management of currently uncontrolled symptomatic uncontrolled type 2 diabetes, hyperlipidemia, hypertension.  PMD:   Elfredia Nevins, MD.   Past Medical History:  Diagnosis Date   CAD (coronary artery disease), native coronary artery    10/18 PCI/DES mLAD, normal EF on LV gram   Diabetes mellitus    Type 2 diagnosed 2 weeks ago   Hiatal hernia with gastroesophageal reflux    Hypertension    Mixed hyperlipidemia    Morbid obesity (HCC)    OSA on CPAP    Urinary frequency    Past Surgical History:  Procedure Laterality Date   CIRCUMCISION     1988   CORONARY STENT INTERVENTION N/A 03/16/2017   Procedure: CORONARY STENT INTERVENTION;  Surgeon: Lyn Records, MD;  Location: [facility];  Service: Cardiovascular;  Laterality: N/A;   ESOPHAGEAL DILATION N/A 02/08/2015   Procedure: ESOPHAGEAL DILATION;  Surgeon: Malissa Hippo, MD;  Location: [facility];  Service: Endoscopy;  Laterality: N/A;   ESOPHAGOGASTRODUODENOSCOPY N/A 02/08/2015   Procedure: ESOPHAGOGASTRODUODENOSCOPY (EGD);  Surgeon: Malissa Hippo, MD;  Location: [facility];  Service: Endoscopy;  Laterality: N/A;  1055   ESOPHAGOGASTRODUODENOSCOPY (EGD) WITH ESOPHAGEAL DILATION  03/29/2012   Procedure: ESOPHAGOGASTRODUODENOSCOPY (EGD) WITH ESOPHAGEAL DILATION;  Surgeon: Malissa Hippo, MD;  Location: [facility];  Service: Endoscopy;  Laterality: N/A;  730   LEFT HEART CATH AND CORONARY ANGIOGRAPHY N/A 03/16/2017   Procedure: LEFT HEART CATH AND CORONARY ANGIOGRAPHY;  Surgeon: Lyn Records, MD;  Location: [facility];  Service: Cardiovascular;  Laterality: N/A;   ULTRASOUND GUIDANCE FOR VASCULAR ACCESS  03/16/2017   Procedure: Ultrasound Guidance For Vascular Access;  Surgeon: Lyn Records, MD;  Location: [facility]  INVASIVE CV LAB;  Service: Cardiovascular;;   Social History   Socioeconomic History   Marital status: Married    Spouse name: Not on file   Number of children: Not on file   Years of education: Not on file   Highest education level: Not on file  Occupational History   Not on file  Tobacco Use   Smoking status: Never   Smokeless tobacco: Never  Vaping Use   Vaping Use: Never used  Substance and Sexual Activity   Alcohol use: No   Drug use: No   Sexual activity: Not on file  Other Topics Concern   Not on file  Social History Narrative   Drinks caffeine 4 times a week.      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   Social Determinants of Health   Financial Resource Strain: Not on file  Food Insecurity: Not on file  Transportation Needs: Not on file  Physical Activity: Not on file  Stress: Not on file  Social Connections: Not on file   Outpatient Encounter Medications as of 06/20/2021  Medication Sig   aspirin 81 MG chewable tablet Chew 1 tablet (81 mg total) by mouth daily.   atorvastatin (LIPITOR) 40 MG tablet TAKE 1 TABLET BY MOUTH ONCE DAILY AT  6  PM   Cholecalciferol (VITAMIN D3) 125 MCG (5000 UT) CAPS Take 1 capsule (5,000 Units total) by mouth daily.   Continuous Blood Gluc Receiver (FREESTYLE LIBRE 14 DAY READER) DEVI Use to check blood glucose 4 times daily.  Continuous Blood Gluc Sensor (FREESTYLE LIBRE 3 SENSOR) MISC 1 Piece by Does not apply route every 14 (fourteen) days. Place 1 sensor on the skin every 14 days. Use to check glucose continuously   cyclobenzaprine (FLEXERIL) 10 MG tablet Take 1 tablet (10 mg total) by mouth 2 (two) times daily as needed for muscle spasms.   glipiZIDE (GLUCOTROL XL) 5 MG 24 hr tablet Take 1 tablet by mouth once daily with breakfast   ibuprofen (ADVIL) 800 MG tablet Take 800 mg by mouth 3 (three) times daily.   insulin glargine, 1 Unit Dial, (TOUJEO SOLOSTAR) 300 UNIT/ML Solostar Pen Inject 70 Units into the skin at bedtime.   Insulin Pen  Needle (B-D ULTRAFINE III SHORT PEN) 31G X 8 MM MISC USE 1 PEN NEEDLE DAILY AS DIRECTED   JANUMET XR 50-1000 MG TB24 TAKE 1 TABLET BY MOUTH AFTER BREAKFAST   lisinopril (ZESTRIL) 20 MG tablet Take 1 tablet (20 mg total) by mouth daily.   Melatonin 2.5 MG CAPS Take 2.5 mg by mouth at bedtime as needed (sleep).   nitroGLYCERIN (NITROSTAT) 0.4 MG SL tablet Place 1 tablet (0.4 mg total) under the tongue every 5 (five) minutes as needed.   sildenafil (VIAGRA) 100 MG tablet Take 100 mg by mouth as needed.   [DISCONTINUED] insulin glargine, 1 Unit Dial, (TOUJEO SOLOSTAR) 300 UNIT/ML Solostar Pen Inject 70 Units into the skin at bedtime.   No facility-administered encounter medications on file as of 06/20/2021.    ALLERGIES: Allergies  Allergen Reactions   Bee Venom    Shellfish Allergy Swelling    Turns red   Strawberry Extract Rash    VACCINATION STATUS: Immunization History  Administered Date(s) Administered   Moderna Sars-Covid-2 Vaccination 08/24/2019, 09/27/2019    Diabetes He presents for his follow-up diabetic visit. He has type 2 diabetes mellitus. Onset time: He reports that he was diagnosed at approximate age of 56 years. His disease course has been improving. There are no hypoglycemic associated symptoms. Pertinent negatives for hypoglycemia include no confusion, headaches, pallor, seizures or tremors. Associated symptoms include polydipsia and polyuria. Pertinent negatives for diabetes include no blurred vision, no chest pain, no fatigue, no polyphagia and no weakness. There are no hypoglycemic complications. Symptoms are improving. Diabetic complications include heart disease. Risk factors for coronary artery disease include dyslipidemia, diabetes mellitus, family history, hypertension, male sex, obesity and sedentary lifestyle. Current diabetic treatment includes insulin injections (Is currently on Toujeo 60 units nightly, Janumet 50/1000 mg p.o. daily, glipizide 5 g p.o. daily.).  His weight is stable. He is following a generally unhealthy diet. When asked about meal planning, he reported none. He has not had a previous visit with a dietitian. He participates in exercise intermittently. His home blood glucose trend is decreasing steadily. His breakfast blood glucose range is generally 140-180 mg/dl. His lunch blood glucose range is generally 140-180 mg/dl. His dinner blood glucose range is generally 140-180 mg/dl. His bedtime blood glucose range is generally 140-180 mg/dl. His overall blood glucose range is 140-180 mg/dl. Joel Ward(Joel Ward returns with significant improvement in his glycemic profile.  He wears a CGM showing average blood glucose of 151.  This is a significant improvement from his average blood glucose of 275+ during his last visit.  He did not document any hypoglycemia.  His most recent A1c was 11.2%.     ) An ACE inhibitor/angiotensin II receptor blocker is being taken. He does not see a podiatrist.Eye exam is not current.  Hyperlipidemia This is a  chronic problem. The current episode started more than 1 year ago. The problem is controlled. Exacerbating diseases include diabetes and obesity. Pertinent negatives include no chest pain, myalgias or shortness of breath. Current antihyperlipidemic treatment includes statins. Risk factors for coronary artery disease include dyslipidemia, diabetes mellitus, hypertension, male sex, obesity, a sedentary lifestyle and family history.  Hypertension This is a chronic problem. The problem is controlled. Pertinent negatives include no blurred vision, chest pain, headaches, neck pain, palpitations or shortness of breath. Risk factors for coronary artery disease include diabetes mellitus, dyslipidemia, obesity, male gender, sedentary lifestyle and family history. Past treatments include ACE inhibitors. Hypertensive end-organ damage includes CAD/MI.    Review of systems Limited as above.   Objective:    BP 128/86    Pulse 84    Ht  5\' 11"  (1.803 m)    Wt (!) 314 lb 3.2 oz (142.5 kg)    BMI 43.82 kg/m   Wt Readings from Last 3 Encounters:  06/20/21 (!) 314 lb 3.2 oz (142.5 kg)  06/04/21 (!) 316 lb 12.8 oz (143.7 kg)  01/16/21 (!) 314 lb 3.2 oz (142.5 kg)      Physical Exam- Limited  Constitutional:  Body mass index is 43.82 kg/m. , not in acute distress, normal state of mind   CMP ( most recent) CMP     Component Value Date/Time   NA 137 06/02/2021 1400   K 4.5 06/02/2021 1400   CL 97 06/02/2021 1400   CO2 26 06/02/2021 1400   GLUCOSE 248 (H) 06/02/2021 1400   GLUCOSE 404 (H) 01/20/2020 0556   BUN 8 06/02/2021 1400   CREATININE 1.05 06/02/2021 1400   CREATININE 0.93 10/12/2019 0833   CALCIUM 9.4 06/02/2021 1400   PROT 7.5 06/02/2021 1400   ALBUMIN 4.3 06/02/2021 1400   AST 20 06/02/2021 1400   ALT 27 06/02/2021 1400   ALKPHOS 88 06/02/2021 1400   BILITOT 0.3 06/02/2021 1400   GFRNONAA >60 01/20/2020 0556   GFRNONAA 95 10/12/2019 0833   GFRAA >60 01/20/2020 0556   GFRAA 110 10/12/2019 0833     Diabetic Labs (most recent): Lab Results  Component Value Date   HGBA1C 11.2 06/04/2021   HGBA1C 7.2 (A) 01/16/2021   HGBA1C 8.3 (A) 09/11/2020     Lipid Panel ( most recent) Lipid Panel     Component Value Date/Time   CHOL 154 09/09/2020 0854   TRIG 76 09/09/2020 0854   HDL 48 09/09/2020 0854   CHOLHDL 3.2 09/09/2020 0854   CHOLHDL 3.0 10/12/2019 0833   VLDL 21 06/29/2017 0807   LDLCALC 91 09/09/2020 0854   LDLCALC 64 10/12/2019 0833     Assessment & Plan:   1. DM type 2 causing vascular disease (HCC) - Patient has currently uncontrolled symptomatic type 2 DM since  56 years of age.  Jaspal returns with significant improvement in his glycemic profile.  He wears a CGM showing average blood glucose of 151.  This is a significant improvement from his average blood glucose of 275+ during his last visit.  He did not document any hypoglycemia.  His most recent A1c was 11.2%.     Recent  labs reviewed with him showing normal renal function.  -his diabetes is complicated by coronary artery disease with stent placement on 03/15/2017 and Joel Ward remains at extremely  high risk for more acute and chronic complications which include CAD, CVA, CKD, retinopathy, and neuropathy. These are all discussed in detail with the patient.  -  I have counseled him on diet management and weight loss, by adopting a carbohydrate restricted/protein rich diet. -He did admits to dietary indiscretions including consumption of sweets and sweetened beverages.     - he acknowledges that there is a room for improvement in his food and drink choices. - Suggestion is made for him to avoid simple carbohydrates  from his diet including Cakes, Sweet Desserts, Ice Cream, Soda (diet and regular), Sweet Tea, Candies, Chips, Cookies, Store Bought Juices, Alcohol , Artificial Sweeteners,  Coffee Creamer, and "Sugar-free" Products, Lemonade. This will help patient to have more stable blood glucose profile and potentially avoid unintended weight gain.  The following Lifestyle Medicine recommendations according to American College of Lifestyle Medicine  [facility]) were discussed and and offered to patient and he  agrees to start the journey:  A. Whole Foods, Plant-Based Nutrition comprising of fruits and vegetables, plant-based proteins, whole-grain carbohydrates was discussed in detail with the patient.   A list for source of those nutrients were also provided to the patient.  Patient will use only water or unsweetened tea for hydration. B.  The need to stay away from risky substances including alcohol, smoking; obtaining 7 to 9 hours of restorative sleep, at least 150 minutes of moderate intensity exercise weekly, the importance of healthy social connections,  and stress management techniques were discussed. C.  A full color page of  Calorie density of various food groups per pound showing examples of each food groups  was provided to the patient.    - I encouraged him to switch to  unprocessed or minimally processed complex starch and increased protein intake (animal or plant source), fruits, and vegetables.  - he is advised to stick to a routine mealtimes to eat 3 meals  a day and avoid unnecessary snacks ( to snack only to correct hypoglycemia).   - he has been  scheduled with Norm Salt, RDN, CDE for individualized DM education- consult pending.  - I have approached him with the following individualized plan to manage diabetes and patient agrees:   -In light of his presentation with achievement of glycemic control, he will not need prandial insulin for now.  His recent A1c was 11.2%, needs to stay on modified diet and current insulin regimen.    He is advised to continue Toujeo 70 units nightly,   continue Janumet 50/1000 mg ER once a day after breakfast. -He is encouraged to call clinic for hypoglycemia below 70 and hyperglycemia above 200 mg/dL. -He is advised to continue glipizide 5 mg XL p.o. daily at breakfast.   -He is encouraged to continue to use his CGM properly.   2) BP/HTN: -His blood pressures not controlled to target.  He is advised to increase his lisinopril to 20 mg p.o. daily at breakfast.    3) Lipids/HPL: His recent lipid panel showed controlled LDL at 64.  He is advised to continue atorvastatin 40 mg p.o. nightly.  Side effects and precautions discussed with him.     4)  Weight/Diet: His current BMI is 43.8--- clearly complicating his diabetes care.  He is a candidate for modest weight loss.   I discussed with him the fact that he would benefit the most from loss of 5 - 10% of his current body weight .  To achieve his he has to stick to the dietary and exercise regimen provided to him and will benefit from bariatric surgery.     CDE Consult has been initiated , exercise, and detailed  carbohydrates information provided.  I discussed and recommended bariatric surgery with him,  he is given brochure on bariatric surgery.  5) vitamin D deficiency: He still has profound vitamin D deficiency at 5515. He is on ongoing supplement with vitamin D 2 50,000 units weekly.   He will have repeat 25-hydroxy vitamin D measured before his next visit.  6) Chronic Care/Health Maintenance:  -he  is on ACEI/ARB and Statin medications and  is encouraged to continue to follow up with Ophthalmology, Dentist,  Podiatrist at least yearly or according to recommendations, and advised to  stay away from smoking. I have recommended yearly flu vaccine and pneumonia vaccination at least every 5 years; moderate intensity exercise for up to 150 minutes weekly; and  sleep for at least 7 hours a day.  He had negative ABI for PAD in April 2022.  His study will be repeated in April 2027, or sooner if needed.   - I advised patient to maintain close follow up with Elfredia NevinsFusco, Lawrence, MD for primary care needs.   I spent 35  minutes in the care of the patient today including review of labs from CMP, Lipids, Thyroid Function, Hematology (current and previous including abstractions from other facilities); face-to-face time discussing  his blood glucose readings/logs, discussing hypoglycemia and hyperglycemia episodes and symptoms, medications doses, his options of short and long term treatment based on the latest standards of care / guidelines;  discussion about incorporating lifestyle medicine;  and documenting the encounter.    Please refer to Patient Instructions for Blood Glucose Monitoring and Insulin/Medications Dosing Guide"  in media tab for additional information. Please  also refer to " Patient Self Inventory" in the Media  tab for reviewed elements of pertinent patient history.  Joel Ward participated in the discussions, expressed understanding, and voiced agreement with the above plans.  All questions were answered to his satisfaction. he is encouraged to contact clinic should he have any  questions or concerns prior to his return visit.    Follow up plan: - Return in about 3 months (around 09/18/2021) for Bring Meter and Logs- A1c in Office.  Joel LunchGebre Islam Villescas, MD Phone: [phone]  Fax: [phone]   06/20/2021, 10:15 AM This note was partially dictated with voice recognition software. Similar sounding words can be transcribed inadequately or may not  be corrected upon review.

## 2021-06-20 NOTE — Patient Instructions (Signed)

## 2021-07-23 DIAGNOSIS — E782 Mixed hyperlipidemia: Secondary | ICD-10-CM | POA: Diagnosis not present

## 2021-07-23 DIAGNOSIS — N529 Male erectile dysfunction, unspecified: Secondary | ICD-10-CM | POA: Diagnosis not present

## 2021-07-23 DIAGNOSIS — Z23 Encounter for immunization: Secondary | ICD-10-CM | POA: Diagnosis not present

## 2021-07-23 DIAGNOSIS — Z6841 Body Mass Index (BMI) 40.0 and over, adult: Secondary | ICD-10-CM | POA: Diagnosis not present

## 2021-07-23 DIAGNOSIS — Z0001 Encounter for general adult medical examination with abnormal findings: Secondary | ICD-10-CM | POA: Diagnosis not present

## 2021-07-24 ENCOUNTER — Other Ambulatory Visit: Payer: Self-pay | Admitting: "Endocrinology

## 2021-08-08 ENCOUNTER — Other Ambulatory Visit: Payer: Self-pay | Admitting: "Endocrinology

## 2021-08-12 ENCOUNTER — Other Ambulatory Visit: Payer: Self-pay | Admitting: "Endocrinology

## 2021-08-13 ENCOUNTER — Other Ambulatory Visit: Payer: Self-pay | Admitting: "Endocrinology

## 2021-09-04 ENCOUNTER — Other Ambulatory Visit: Payer: Self-pay | Admitting: "Endocrinology

## 2021-09-19 ENCOUNTER — Encounter: Payer: Self-pay | Admitting: "Endocrinology

## 2021-09-19 ENCOUNTER — Ambulatory Visit: Payer: BC Managed Care – PPO | Admitting: "Endocrinology

## 2021-09-19 VITALS — BP 128/72 | HR 64 | Ht 71.0 in | Wt 314.0 lb

## 2021-09-19 DIAGNOSIS — E782 Mixed hyperlipidemia: Secondary | ICD-10-CM

## 2021-09-19 DIAGNOSIS — E559 Vitamin D deficiency, unspecified: Secondary | ICD-10-CM | POA: Diagnosis not present

## 2021-09-19 DIAGNOSIS — E1159 Type 2 diabetes mellitus with other circulatory complications: Secondary | ICD-10-CM

## 2021-09-19 DIAGNOSIS — I10 Essential (primary) hypertension: Secondary | ICD-10-CM | POA: Diagnosis not present

## 2021-09-19 LAB — POCT GLYCOSYLATED HEMOGLOBIN (HGB A1C): HbA1c, POC (controlled diabetic range): 6.7 % (ref 0.0–7.0)

## 2021-09-19 MED ORDER — DAPAGLIFLOZIN PROPANEDIOL 5 MG PO TABS: 5.0000 mg | ORAL_TABLET | Freq: Every day | ORAL | 1 refills | Status: DC

## 2021-09-19 NOTE — Progress Notes (Signed)
? ?09/19/2021 ? ? ?Endocrinology follow-up note ? ? ? ?Subjective:  ? ? [name], male    DOB: [birth year].  ?he is being seen in follow-up after he was seen in consultation for  management of currently uncontrolled symptomatic uncontrolled type 2 diabetes, hyperlipidemia, hypertension. ? ?PMD:   Elfredia Nevins, MD. ? ? ?Past Medical History:  ?Diagnosis Date  ? CAD (coronary artery disease), native coronary artery   ? 10/18 PCI/DES mLAD, normal EF on LV gram  ? Diabetes mellitus   ? Type 2 diagnosed 2 weeks ago  ? Hiatal hernia with gastroesophageal reflux   ? Hypertension   ? Mixed hyperlipidemia   ? Morbid obesity (HCC)   ? OSA on CPAP   ? Urinary frequency   ? ?Past Surgical History:  ?Procedure Laterality Date  ? CIRCUMCISION    ? 1988  ? CORONARY STENT INTERVENTION N/A 03/16/2017  ? Procedure: CORONARY STENT INTERVENTION;  Surgeon: Lyn Records, MD;  Location: [facility] INVASIVE CV LAB;  Service: Cardiovascular;  Laterality: N/A;  ? ESOPHAGEAL DILATION N/A 02/08/2015  ? Procedure: ESOPHAGEAL DILATION;  Surgeon: Malissa Hippo, MD;  Location: [facility];  Service: Endoscopy;  Laterality: N/A;  ? ESOPHAGOGASTRODUODENOSCOPY N/A 02/08/2015  ? Procedure: ESOPHAGOGASTRODUODENOSCOPY (EGD);  Surgeon: Malissa Hippo, MD;  Location: [facility];  Service: Endoscopy;  Laterality: N/A;  1055  ? ESOPHAGOGASTRODUODENOSCOPY (EGD) WITH ESOPHAGEAL DILATION  03/29/2012  ? Procedure: ESOPHAGOGASTRODUODENOSCOPY (EGD) WITH ESOPHAGEAL DILATION;  Surgeon: Malissa Hippo, MD;  Location: [facility];  Service: Endoscopy;  Laterality: N/A;  730  ? LEFT HEART CATH AND CORONARY ANGIOGRAPHY N/A 03/16/2017  ? Procedure: LEFT HEART CATH AND CORONARY ANGIOGRAPHY;  Surgeon: Lyn Records, MD;  Location: [facility] INVASIVE CV LAB;  Service: Cardiovascular;  Laterality: N/A;  ? ULTRASOUND GUIDANCE FOR VASCULAR ACCESS  03/16/2017  ? Procedure: Ultrasound Guidance For Vascular Access;  Surgeon: Lyn Records, MD;  Location: [facility]  INVASIVE CV LAB;  Service: Cardiovascular;;  ? ?Social History  ? ?Socioeconomic History  ? Marital status: Married  ?  Spouse name: Not on file  ? Number of children: Not on file  ? Years of education: Not on file  ? Highest education level: Not on file  ?Occupational History  ? Not on file  ?Tobacco Use  ? Smoking status: Never  ? Smokeless tobacco: Never  ?Vaping Use  ? Vaping Use: Never used  ?Substance and Sexual Activity  ? Alcohol use: No  ? Drug use: No  ? Sexual activity: Not on file  ?Other Topics Concern  ? Not on file  ?Social History Narrative  ? Drinks caffeine 4 times a week.  ?   ? 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  ? ?Social Determinants of Health  ? ?Financial Resource Strain: Not on file  ?Food Insecurity: Not on file  ?Transportation Needs: Not on file  ?Physical Activity: Not on file  ?Stress: Not on file  ?Social Connections: Not on file  ? ?Outpatient Encounter Medications as of 09/19/2021  ?Medication Sig  ? dapagliflozin propanediol (FARXIGA) 5 MG TABS tablet Take 1 tablet (5 mg total) by mouth daily before breakfast.  ? aspirin 81 MG chewable tablet Chew 1 tablet (81 mg total) by mouth daily.  ? atorvastatin (LIPITOR) 40 MG tablet TAKE 1 TABLET BY MOUTH ONCE DAILY AT  6  PM  ? Cholecalciferol (VITAMIN D3) 125 MCG (5000 UT) CAPS Take 1 capsule (5,000 Units total) by mouth daily.  ?  Continuous Blood Gluc Receiver (FREESTYLE LIBRE 14 DAY READER) DEVI Use to check blood glucose 4 times daily.  ? Continuous Blood Gluc Sensor (FREESTYLE LIBRE 3 SENSOR) MISC PLACE 1 SENSOR ON THE SKIN EVERY 14 DAYS USE TO CHECK GLUCOSE CONTINUOUSLY  ? cyclobenzaprine (FLEXERIL) 10 MG tablet Take 1 tablet (10 mg total) by mouth 2 (two) times daily as needed for muscle spasms.  ? ibuprofen (ADVIL) 800 MG tablet Take 800 mg by mouth 3 (three) times daily.  ? insulin glargine, 1 Unit Dial, (TOUJEO SOLOSTAR) 300 UNIT/ML Solostar Pen Inject 70 Units into the skin at bedtime.  ? Insulin Pen Needle (B-D ULTRAFINE III SHORT PEN) 31G  X 8 MM MISC USE 1 PEN NEEDLE DAILY AS DIRECTED  ? JANUMET XR 50-1000 MG TB24 TAKE 1 TABLET BY MOUTH AFTER BREAKFAST  ? lisinopril (ZESTRIL) 20 MG tablet Take 1 tablet by mouth once daily  ? Melatonin 2.5 MG CAPS Take 2.5 mg by mouth at bedtime as needed (sleep).  ? nitroGLYCERIN (NITROSTAT) 0.4 MG SL tablet Place 1 tablet (0.4 mg total) under the tongue every 5 (five) minutes as needed.  ? sildenafil (VIAGRA) 100 MG tablet Take 100 mg by mouth as needed.  ? [DISCONTINUED] glipiZIDE (GLUCOTROL XL) 5 MG 24 hr tablet Take 1 tablet by mouth once daily with breakfast  ? ?No facility-administered encounter medications on file as of 09/19/2021.  ? ? ?ALLERGIES: ?Allergies  ?Allergen Reactions  ? Bee Venom   ? Shellfish Allergy Swelling  ?  Turns red  ? Strawberry Extract Rash  ? ? ?VACCINATION STATUS: ?Immunization History  ?Administered Date(s) Administered  ? Moderna Sars-Covid-2 Vaccination 08/24/2019, 09/27/2019  ? ? ?Diabetes ?He presents for his follow-up diabetic visit. He has type 2 diabetes mellitus. Onset time: He reports that he was diagnosed at approximate age of 56 years. His disease course has been improving. There are no hypoglycemic associated symptoms. Pertinent negatives for hypoglycemia include no confusion, headaches, pallor, seizures or tremors. Pertinent negatives for diabetes include no blurred vision, no chest pain, no fatigue, no polydipsia, no polyphagia, no polyuria and no weakness. There are no hypoglycemic complications. Symptoms are improving. Diabetic complications include heart disease. Risk factors for coronary artery disease include dyslipidemia, diabetes mellitus, family history, hypertension, male sex, obesity and sedentary lifestyle. Current diabetic treatment includes insulin injections (Is currently on Toujeo 60 units nightly, Janumet 50/1000 mg p.o. daily, glipizide 5 g p.o. daily.). His weight is stable. He is following a generally unhealthy diet. When asked about meal planning, he  reported none. He has not had a previous visit with a dietitian. He participates in exercise intermittently. His home blood glucose trend is decreasing steadily. His breakfast blood glucose range is generally 130-140 mg/dl. His lunch blood glucose range is generally 140-180 mg/dl. His dinner blood glucose range is generally 140-180 mg/dl. His bedtime blood glucose range is generally 140-180 mg/dl. His overall blood glucose range is 140-180 mg/dl. Joel Ward returns with continued improvement in his glycemic profile.  His freestyle libre device was reviewed and AGP report shows 66% time range, 34% slightly above range.  Has no hypoglycemia.  His average blood glucose is 161 for the last 14 days.  His point-of-care A1c is 6.7%, significant improvement from 11.2% during his last visit.   ? ? ? ?) An ACE inhibitor/angiotensin II receptor blocker is being taken. He does not see a podiatrist.Eye exam is not current.  ?Hyperlipidemia ?This is a chronic problem. The current episode started more than 1  year ago. The problem is controlled. Exacerbating diseases include diabetes and obesity. Pertinent negatives include no chest pain, myalgias or shortness of breath. Current antihyperlipidemic treatment includes statins. Risk factors for coronary artery disease include dyslipidemia, diabetes mellitus, hypertension, male sex, obesity, a sedentary lifestyle and family history.  ?Hypertension ?This is a chronic problem. The problem is controlled. Pertinent negatives include no blurred vision, chest pain, headaches, neck pain, palpitations or shortness of breath. Risk factors for coronary artery disease include diabetes mellitus, dyslipidemia, obesity, male gender, sedentary lifestyle and family history. Past treatments include ACE inhibitors. Hypertensive end-organ damage includes CAD/MI.  ? ? ?Review of systems ?Limited as above. ? ? ?Objective:  ?  ?BP 128/72   Pulse 64   Ht 5\' 11"  (1.803 m)   Wt (!) 314 lb (142.4 kg)   BMI  43.79 kg/m?   ?Wt Readings from Last 3 Encounters:  ?09/19/21 (!) 314 lb (142.4 kg)  ?06/20/21 (!) 314 lb 3.2 oz (142.5 kg)  ?06/04/21 (!) 316 lb 12.8 oz (143.7 kg)  ?  ? ? ?Physical Exam- Limited ? ?Constit

## 2021-09-19 NOTE — Patient Instructions (Signed)

## 2021-10-16 DIAGNOSIS — Z23 Encounter for immunization: Secondary | ICD-10-CM | POA: Diagnosis not present

## 2021-10-30 ENCOUNTER — Other Ambulatory Visit: Payer: Self-pay | Admitting: "Endocrinology

## 2021-11-14 ENCOUNTER — Other Ambulatory Visit: Payer: Self-pay | Admitting: "Endocrinology

## 2021-11-18 ENCOUNTER — Other Ambulatory Visit: Payer: Self-pay | Admitting: "Endocrinology

## 2021-11-24 ENCOUNTER — Other Ambulatory Visit: Payer: Self-pay | Admitting: "Endocrinology

## 2021-12-01 ENCOUNTER — Other Ambulatory Visit: Payer: Self-pay | Admitting: "Endocrinology

## 2021-12-12 ENCOUNTER — Other Ambulatory Visit: Payer: Self-pay | Admitting: "Endocrinology

## 2021-12-12 DIAGNOSIS — E1159 Type 2 diabetes mellitus with other circulatory complications: Secondary | ICD-10-CM | POA: Diagnosis not present

## 2021-12-13 LAB — COMPREHENSIVE METABOLIC PANEL
ALT: 25 IU/L (ref 0–44)
AST: 22 IU/L (ref 0–40)
Albumin/Globulin Ratio: 1.6 (ref 1.2–2.2)
Albumin: 4.2 g/dL (ref 3.8–4.9)
Alkaline Phosphatase: 78 IU/L (ref 44–121)
BUN/Creatinine Ratio: 12 (ref 9–20)
BUN: 11 mg/dL (ref 6–24)
Bilirubin Total: 0.4 mg/dL (ref 0.0–1.2)
CO2: 23 mmol/L (ref 20–29)
Calcium: 9.4 mg/dL (ref 8.7–10.2)
Chloride: 101 mmol/L (ref 96–106)
Creatinine, Ser: 0.95 mg/dL (ref 0.76–1.27)
Globulin, Total: 2.7 g/dL (ref 1.5–4.5)
Glucose: 98 mg/dL (ref 70–99)
Potassium: 3.7 mmol/L (ref 3.5–5.2)
Sodium: 140 mmol/L (ref 134–144)
Total Protein: 6.9 g/dL (ref 6.0–8.5)
eGFR: 96 mL/min/{1.73_m2} (ref >59)

## 2021-12-13 LAB — TSH: TSH: 2.01 u[IU]/mL (ref 0.450–4.500)

## 2021-12-13 LAB — LIPID PANEL
Chol/HDL Ratio: 2.6 ratio (ref 0.0–5.0)
Cholesterol, Total: 117 mg/dL (ref 100–199)
HDL: 45 mg/dL (ref >39)
LDL Chol Calc (NIH): 54 mg/dL (ref 0–99)
Triglycerides: 97 mg/dL (ref 0–149)
VLDL Cholesterol Cal: 18 mg/dL (ref 5–40)

## 2021-12-13 LAB — T4, FREE: Free T4: 1.05 ng/dL (ref 0.82–1.77)

## 2021-12-13 LAB — VITAMIN D 25 HYDROXY (VIT D DEFICIENCY, FRACTURES): Vit D, 25-Hydroxy: 15 ng/mL — ABNORMAL LOW (ref 30.0–100.0)

## 2021-12-19 ENCOUNTER — Ambulatory Visit: Payer: BC Managed Care – PPO | Admitting: "Endocrinology

## 2021-12-19 ENCOUNTER — Encounter: Payer: Self-pay | Admitting: "Endocrinology

## 2021-12-19 VITALS — BP 130/92 | HR 76 | Ht 71.0 in | Wt 298.0 lb

## 2021-12-19 DIAGNOSIS — I10 Essential (primary) hypertension: Secondary | ICD-10-CM

## 2021-12-19 DIAGNOSIS — E1159 Type 2 diabetes mellitus with other circulatory complications: Secondary | ICD-10-CM | POA: Diagnosis not present

## 2021-12-19 DIAGNOSIS — E559 Vitamin D deficiency, unspecified: Secondary | ICD-10-CM | POA: Diagnosis not present

## 2021-12-19 DIAGNOSIS — E782 Mixed hyperlipidemia: Secondary | ICD-10-CM

## 2021-12-19 LAB — POCT GLYCOSYLATED HEMOGLOBIN (HGB A1C): HbA1c, POC (controlled diabetic range): 5.7 % (ref 0.0–7.0)

## 2021-12-19 MED ORDER — TOUJEO SOLOSTAR 300 UNIT/ML ~~LOC~~ SOPN: 50.0000 [IU] | PEN_INJECTOR | Freq: Every day | SUBCUTANEOUS | 1 refills | Status: DC

## 2021-12-19 MED ORDER — VITAMIN D (ERGOCALCIFEROL) 1.25 MG (50000 UNIT) PO CAPS: 50000.0000 [IU] | ORAL_CAPSULE | ORAL | 0 refills | Status: DC

## 2021-12-19 NOTE — Progress Notes (Signed)
12/19/2021   Endocrinology follow-up note    Subjective:    [name], male    DOB: 06-15-1965.  he is being seen in follow-up after he was seen in consultation for  management of currently uncontrolled symptomatic uncontrolled type 2 diabetes, hyperlipidemia, hypertension.  PMD:   Redmond School, MD.   Past Medical History:  Diagnosis Date   CAD (coronary artery disease), native coronary artery    10/18 PCI/DES mLAD, normal EF on LV gram   Diabetes mellitus    Type 2 diagnosed 2 weeks ago   Hiatal hernia with gastroesophageal reflux    Hypertension    Mixed hyperlipidemia    Morbid obesity (HCC)    OSA on CPAP    Urinary frequency    Past Surgical History:  Procedure Laterality Date   CIRCUMCISION     1988   CORONARY STENT INTERVENTION N/A 03/16/2017   Procedure: CORONARY STENT INTERVENTION;  Surgeon: Belva Crome, MD;  Location: [facility];  Service: Cardiovascular;  Laterality: N/A;   ESOPHAGEAL DILATION N/A 02/08/2015   Procedure: ESOPHAGEAL DILATION;  Surgeon: Rogene Houston, MD;  [facility];  Service: Endoscopy;  Laterality: N/A;   ESOPHAGOGASTRODUODENOSCOPY N/A 02/08/2015   Procedure: ESOPHAGOGASTRODUODENOSCOPY (EGD);  Surgeon: Rogene Houston, MD;  [facility];  Service: Endoscopy;  Laterality: N/A;  1055   ESOPHAGOGASTRODUODENOSCOPY (EGD) WITH ESOPHAGEAL DILATION  03/29/2012   Procedure: ESOPHAGOGASTRODUODENOSCOPY (EGD) WITH ESOPHAGEAL DILATION;  Surgeon: Rogene Houston, MD;  [facility];  Service: Endoscopy;  Laterality: N/A;  730   LEFT HEART CATH AND CORONARY ANGIOGRAPHY N/A 03/16/2017   Procedure: LEFT HEART CATH AND CORONARY ANGIOGRAPHY;  Surgeon: Belva Crome, MD;  Location: [facility];  Service: Cardiovascular;  Laterality: N/A;   ULTRASOUND GUIDANCE FOR VASCULAR ACCESS  03/16/2017   Procedure: Ultrasound Guidance For Vascular Access;  Surgeon: Belva Crome, MD;  Location: [facility];  Service: Cardiovascular;;   Social History   Socioeconomic History   Marital status: Married    Spouse name: Not on file   Number of children: Not on file   Years of education: Not on file   Highest education level: Not on file  Occupational History   Not on file  Tobacco Use   Smoking status: Never   Smokeless tobacco: Never  Vaping Use   Vaping Use: Never used  Substance and Sexual Activity   Alcohol use: No   Drug use: No   Sexual activity: Not on file  Other Topics Concern   Not on file  Social History Narrative   Drinks caffeine 4 times a week.      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   Social Determinants of Health   Financial Resource Strain: Not on file  Food Insecurity: Not on file  Transportation Needs: Not on file  Physical Activity: Not on file  Stress: Not on file  Social Connections: Not on file   Outpatient Encounter Medications as of 12/19/2021  Medication Sig   Vitamin D, Ergocalciferol, (DRISDOL) 1.25 MG (50000 UNIT) CAPS capsule Take 1 capsule (50,000 Units total) by mouth every 7 (seven) days.   aspirin 81 MG chewable tablet Chew 1 tablet (81 mg total) by mouth daily.   atorvastatin (LIPITOR) 40 MG tablet TAKE 1 TABLET BY MOUTH ONCE DAILY AT  6  PM   Cholecalciferol (VITAMIN D3) 125 MCG (5000 UT) CAPS Take 1 capsule (5,000 Units total)  by mouth daily.   Continuous Blood Gluc Receiver (FREESTYLE LIBRE 14 DAY READER) DEVI Use to check blood glucose 4 times daily.   Continuous Blood Gluc Sensor (FREESTYLE LIBRE 3 SENSOR) MISC PLACE 1 SENSOR ON THE SKIN EVERY 14 DAYS TO CHECK GLUCOSE CONTINUOUSLY   cyclobenzaprine (FLEXERIL) 10 MG tablet Take 1 tablet (10 mg total) by mouth 2 (two) times daily as needed for muscle spasms.   dapagliflozin propanediol (FARXIGA) 5 MG TABS tablet Take 1 tablet (5 mg total) by mouth daily before breakfast.   ibuprofen (ADVIL) 800 MG tablet Take 800 mg by mouth 3 (three) times daily.   insulin glargine, 1 Unit Dial,  (TOUJEO SOLOSTAR) 300 UNIT/ML Solostar Pen Inject 50 Units into the skin at bedtime.   Insulin Pen Needle (B-D ULTRAFINE III SHORT PEN) 31G X 8 MM MISC USE 1 PEN NEEDLE DAILY AS DIRECTED   JANUMET XR 50-1000 MG TB24 TAKE 1 TABLET BY MOUTH AFTER BREAKFAST   lisinopril (ZESTRIL) 20 MG tablet Take 1 tablet by mouth once daily   Melatonin 2.5 MG CAPS Take 2.5 mg by mouth at bedtime as needed (sleep).   nitroGLYCERIN (NITROSTAT) 0.4 MG SL tablet Place 1 tablet (0.4 mg total) under the tongue every 5 (five) minutes as needed.   sildenafil (VIAGRA) 100 MG tablet Take 100 mg by mouth as needed.   [DISCONTINUED] TOUJEO SOLOSTAR 300 UNIT/ML Solostar Pen INJECT 70 UNITS SUBCUTANEOUSLY AT BEDTIME   No facility-administered encounter medications on file as of 12/19/2021.    ALLERGIES: Allergies  Allergen Reactions   Bee Venom    Shellfish Allergy Swelling    Turns red   Strawberry Extract Rash    VACCINATION STATUS: Immunization History  Administered Date(s) Administered   Moderna Sars-Covid-2 Vaccination 08/24/2019, 09/27/2019    Diabetes He presents for his follow-up diabetic visit. He has type 2 diabetes mellitus. Onset time: He reports that he was diagnosed at approximate age of 32 years. His disease course has been improving. There are no hypoglycemic associated symptoms. Pertinent negatives for hypoglycemia include no confusion, headaches, pallor, seizures or tremors. Pertinent negatives for diabetes include no blurred vision, no chest pain, no fatigue, no polydipsia, no polyphagia, no polyuria and no weakness. There are no hypoglycemic complications. Symptoms are improving. Diabetic complications include heart disease. Risk factors for coronary artery disease include dyslipidemia, diabetes mellitus, family history, hypertension, male sex, obesity and sedentary lifestyle. Current diabetic treatment includes insulin injections (Is currently on Toujeo 60 units nightly, Janumet 50/1000 mg p.o.  daily, glipizide 5 g p.o. daily.). His weight is stable. He is following a generally unhealthy diet. When asked about meal planning, he reported none. He has not had a previous visit with a dietitian. He participates in exercise intermittently. His home blood glucose trend is decreasing steadily. His breakfast blood glucose range is generally 130-140 mg/dl. His lunch blood glucose range is generally 130-140 mg/dl. His dinner blood glucose range is generally 130-140 mg/dl. His bedtime blood glucose range is generally 130-140 mg/dl. His overall blood glucose range is 130-140 mg/dl. Maples returns with significant improvement in his glycemic profile.  He is CGM was downloaded and analyzed.  His AGP report shows 89% time range, 11% level 1 hyperglycemia.  No hypoglycemia.  His point-of-care A1c was 5.7%, progressively improving from 11.2%.  His average blood glucose is 136 for the last 14 days.  He presents with 27 pounds of weight loss.    ) An ACE inhibitor/angiotensin II receptor blocker is being taken. He  does not see a podiatrist.Eye exam is not current.  Hyperlipidemia This is a chronic problem. The current episode started more than 1 year ago. The problem is controlled. Exacerbating diseases include diabetes and obesity. Pertinent negatives include no chest pain, myalgias or shortness of breath. Current antihyperlipidemic treatment includes statins. Risk factors for coronary artery disease include dyslipidemia, diabetes mellitus, hypertension, male sex, obesity, a sedentary lifestyle and family history.  Hypertension This is a chronic problem. The problem is controlled. Pertinent negatives include no blurred vision, chest pain, headaches, neck pain, palpitations or shortness of breath. Risk factors for coronary artery disease include diabetes mellitus, dyslipidemia, obesity, male gender, sedentary lifestyle and family history. Past treatments include ACE inhibitors. Hypertensive end-organ damage  includes CAD/MI.     Review of systems Limited as above.   Objective:    BP (!) 130/92   Pulse 76   Ht 5\' 11"  (1.803 m)   Wt 298 lb (135.2 kg)   BMI 41.56 kg/m   Wt Readings from Last 3 Encounters:  12/19/21 298 lb (135.2 kg)  09/19/21 (!) 314 lb (142.4 kg)  06/20/21 (!) 314 lb 3.2 oz (142.5 kg)      Physical Exam- Limited  Constitutional:  Body mass index is 41.56 kg/m. , not in acute distress, normal state of mind   CMP ( most recent) CMP     Component Value Date/Time   NA 140 12/12/2021 0910   K 3.7 12/12/2021 0910   CL 101 12/12/2021 0910   CO2 23 12/12/2021 0910   GLUCOSE 98 12/12/2021 0910   GLUCOSE 404 (H) 01/20/2020 0556   BUN 11 12/12/2021 0910   CREATININE 0.95 12/12/2021 0910   CREATININE 0.93 10/12/2019 0833   CALCIUM 9.4 12/12/2021 0910   PROT 6.9 12/12/2021 0910   ALBUMIN 4.2 12/12/2021 0910   AST 22 12/12/2021 0910   ALT 25 12/12/2021 0910   ALKPHOS 78 12/12/2021 0910   BILITOT 0.4 12/12/2021 0910   GFRNONAA >60 01/20/2020 0556   GFRNONAA 95 10/12/2019 0833   GFRAA >60 01/20/2020 0556   GFRAA 110 10/12/2019 0833     Diabetic Labs (most recent): Lab Results  Component Value Date   HGBA1C 5.7 12/19/2021   HGBA1C 6.7 09/19/2021   HGBA1C 11.2 06/04/2021   MICROALBUR 1.8 10/12/2019   MICROALBUR 0.5 03/01/2018     Lipid Panel ( most recent) Lipid Panel     Component Value Date/Time   CHOL 117 12/12/2021 0910   TRIG 97 12/12/2021 0910   HDL 45 12/12/2021 0910   CHOLHDL 2.6 12/12/2021 0910   CHOLHDL 3.0 10/12/2019 0833   VLDL 21 06/29/2017 0807   LDLCALC 54 12/12/2021 0910   LDLCALC 64 10/12/2019 0833     Assessment & Plan:   1. DM type 2 causing vascular disease (HCC) - Patient has currently uncontrolled symptomatic type 2 DM since  56 years of age.  Joel Ward returns with significant improvement in his glycemic profile.  He is CGM was downloaded and analyzed.  His AGP report shows 89% time range, 11% level 1 hyperglycemia.   No hypoglycemia.  His point-of-care A1c was 5.7%, progressively improving from 11.2%.  His average blood glucose is 136 for the last 14 days.  He presents with 27 pounds of weight loss.     Recent labs reviewed with him showing normal renal function.  -his diabetes is complicated by coronary artery disease with stent placement on 03/15/2017 and Joel Ward remains at extremely  high risk for  more acute and chronic complications which include CAD, CVA, CKD, retinopathy, and neuropathy. These are all discussed in detail with the patient.  - I have counseled him on diet management and weight loss, by adopting a carbohydrate restricted/protein rich diet. -He did admits to dietary indiscretions including consumption of sweets and sweetened beverages.     He is engaged and benefiting from lifestyle medicine.  - he acknowledges that there is a room for improvement in his food and drink choices. - Suggestion is made for him to avoid simple carbohydrates  from his diet including Cakes, Sweet Desserts, Ice Cream, Soda (diet and regular), Sweet Tea, Candies, Chips, Cookies, Store Bought Juices, Alcohol , Artificial Sweeteners,  Coffee Creamer, and "Sugar-free" Products, Lemonade. This will help patient to have more stable blood glucose profile and potentially avoid unintended weight gain.  The following Lifestyle Medicine recommendations according to [facility]) were discussed and and offered to patient and he  agrees to start the journey:  A. Whole Foods, Plant-Based Nutrition comprising of fruits and vegetables, plant-based proteins, whole-grain carbohydrates was discussed in detail with the patient.   A list for source of those nutrients were also provided to the patient.  Patient will use only water or unsweetened tea for hydration. B.  The need to stay away from risky substances including alcohol, smoking; obtaining 7 to 9 hours of restorative sleep, at least 150  minutes of moderate intensity exercise weekly, the importance of healthy social connections,  and stress management techniques were discussed. C.  A full color page of  Calorie density of various food groups per pound showing examples of each food groups was provided to the patient.   - I encouraged him to switch to  unprocessed or minimally processed complex starch and increased protein intake (animal or plant source), fruits, and vegetables.  - he is advised to stick to a routine mealtimes to eat 3 meals  a day and avoid unnecessary snacks ( to snack only to correct hypoglycemia).   - he has been  scheduled with Jearld Fenton, RDN, CDE for individualized DM education- consult pending.  - I have approached him with the following individualized plan to manage diabetes and patient agrees:   -In light of his presentation with significant achievement and control his glycemia, he will be considered for lower dose of insulin.  He is advised to lower his Toujeo to 50 units nightly,  associated with continuous use of his CGM.  He is advised to continue Janumet 50/1000 mg ER once a day after breakfast.  After he finishes his current supplies, he will be continued only on metformin 1000 mg p.o. daily.  If necessary, he will be considered for Ozempic or Trulicity injection weekly. -He is encouraged to call clinic for hypoglycemia below 70 and hyperglycemia above 200 mg/dL. -He he is benefiting from low-dose SGLT2 inhibitors.  I discussed and continue Farxiga 5 mg XL p.o. daily at breakfast.     2) BP/HTN: -Lifestyle medicine helping with blood pressure as well.  His blood pressure is controlled to target.  He is advised to continue  lisinopril to 20 mg p.o. daily at breakfast.    3) Lipids/HPL: His LDL is improving from 92 to 54.  This is mainly due to his recent change in diet.  He is advised to continue atorvastatin 40 mg p.o. nightly.     Side effects and precautions discussed with him.     4)  Weight/Diet: His current BMI is 41.56-, presents with 27 pounds of weight loss overall.  He is still a candidate for modest weight loss.  I discussed with him the fact that he would benefit the most from loss of 5 - 10% of his current body weight .  To achieve his he has to stick to the dietary and exercise regimen provided to him and will benefit from bariatric surgery.     CDE Consult has been initiated , exercise, and detailed carbohydrates information provided.  I discussed and recommended bariatric surgery with him, he is given brochure on bariatric surgery.  5) vitamin D deficiency: He still has profound vitamin D deficiency at 64. He is on ongoing supplement with vitamin D 2 50,000 units weekly.  He will have repeat 25-hydroxy vitamin D measured before his next visit.   6) Chronic Care/Health Maintenance:  -he  is on ACEI/ARB and Statin medications and  is encouraged to continue to follow up with Ophthalmology, Dentist,  Podiatrist at least yearly or according to recommendations, and advised to  stay away from smoking. I have recommended yearly flu vaccine and pneumonia vaccination at least every 5 years; moderate intensity exercise for up to 150 minutes weekly; and  sleep for at least 7 hours a day.  He had negative ABI for PAD in April 2022.  His study will be repeated in April 2027, or sooner if needed.   - I advised patient to maintain close follow up with Elfredia Nevins, MD for primary care needs.   I spent 42 minutes in the care of the patient today including review of labs from CMP, Lipids, Thyroid Function, Hematology (current and previous including abstractions from other facilities); face-to-face time discussing  his blood glucose readings/logs, discussing hypoglycemia and hyperglycemia episodes and symptoms, medications doses, his options of short and long term treatment based on the latest standards of care / guidelines;  discussion about incorporating lifestyle medicine;  and  documenting the encounter. Risk reduction counseling performed per USPSTF guidelines to reduce obesity and cardiovascular risk factors.     Please refer to Patient Instructions for Blood Glucose Monitoring and Insulin/Medications Dosing Guide"  in media tab for additional information. Please  also refer to " Patient Self Inventory" in the Media  tab for reviewed elements of pertinent patient history.  Army Fossa Willig participated in the discussions, expressed understanding, and voiced agreement with the above plans.  All questions were answered to his satisfaction. he is encouraged to contact clinic should he have any questions or concerns prior to his return visit.     Follow up plan: - Return in about 3 months (around 03/21/2022) for Bring Meter/CGM Device/Logs- A1c in Office.  Marquis Lunch, MD Phone: [phone]  Fax: [phone]   12/19/2021, 12:26 PM This note was partially dictated with voice recognition software. Similar sounding words can be transcribed inadequately or may not  be corrected upon review.

## 2021-12-19 NOTE — Patient Instructions (Signed)

## 2021-12-30 ENCOUNTER — Telehealth: Payer: Self-pay | Admitting: *Deleted

## 2021-12-30 NOTE — Chronic Care Management (AMB) (Signed)
  Care Coordination   Note   12/30/2021 Name: Joel Ward MRN: [id number] DOB: [birth year]  Joel Ward is a 56 y.o. year old male who sees Elfredia Nevins, MD for primary care. I reached out to Arline Asp by phone today to offer care coordination services.  Mr. Kunkle was given information about Care Coordination services today including:   The Care Coordination services include support from the care team which includes your Nurse Coordinator, Clinical Social Worker, or Pharmacist.  The Care Coordination team is here to help remove barriers to the health concerns and goals most important to you. Care Coordination services are voluntary, and the patient may decline or stop services at any time by request to their care team member.   Care Coordination Consent Status: Patient agreed to services and verbal consent obtained.   Follow up plan:  Telephone appointment with care coordination team member scheduled for:  01/05/22  Encounter Outcome:  Pt. Scheduled   [facility] Coordination Care Guide  Direct Dial: [phone]

## 2022-01-05 ENCOUNTER — Ambulatory Visit: Payer: Self-pay | Admitting: *Deleted

## 2022-01-05 NOTE — Patient Outreach (Signed)
  Care Coordination   01/05/2022 Name: Joel Ward MRN: [id number] DOB: November 25, 1965   Care Coordination Outreach Attempts:  An unsuccessful telephone outreach was attempted today to offer the patient information about available care coordination services as a benefit of their health plan.   Follow Up Plan:  Additional outreach attempts will be made to offer the patient care coordination information and services.   Encounter Outcome:  No Answer  Care Coordination Interventions Activated:  No   Care Coordination Interventions:  No, not indicated    Kemper Durie, RN, MSN, [facility] Care Coordinator [phone]

## 2022-01-15 ENCOUNTER — Other Ambulatory Visit: Payer: Self-pay | Admitting: "Endocrinology

## 2022-01-15 ENCOUNTER — Telehealth: Payer: Self-pay | Admitting: "Endocrinology

## 2022-01-15 MED ORDER — METFORMIN HCL ER (OSM) 1000 MG PO TB24
1000.0000 mg | ORAL_TABLET | Freq: Every day | ORAL | 1 refills | Status: DC
Start: 2022-01-15 — End: 2022-01-16

## 2022-01-15 NOTE — Telephone Encounter (Signed)
New message    Pt c/o medication issue:  1. Name of Medication: JANUMET XR 50-1000 MG TB24  2. How are you currently taking this medication (dosage and times per day)? Once a day   3. Are you having a reaction (difficulty breathing--STAT)? No   4. What is your medication issue? Patient has finished his medication was told to call back - MD was going to change meidcation   5. Which pharmacy/location (including street and city if local pharmacy) is medication to be sent to? [facility] in [place]

## 2022-01-16 ENCOUNTER — Other Ambulatory Visit: Payer: Self-pay

## 2022-01-16 ENCOUNTER — Ambulatory Visit: Payer: Self-pay | Admitting: *Deleted

## 2022-01-16 ENCOUNTER — Encounter: Payer: Self-pay | Admitting: *Deleted

## 2022-01-16 MED ORDER — METFORMIN HCL ER 500 MG PO TB24: 1000.0000 mg | ORAL_TABLET | Freq: Every day | ORAL | 0 refills | Status: DC

## 2022-01-16 NOTE — Patient Outreach (Signed)
  Care Coordination   Initial Visit Note   01/16/2022 Name: Joel Ward MRN: [id number] DOB: 1965/08/31  Joel Ward is a 56 y.o. year old male who sees Elfredia Nevins, MD for primary care. I spoke with  Arline Asp by phone today  What matters to the patients health and wellness today?  Staying healthy, feels he is doing well with chronic conditions.  DM stable, A1C now 5.7.  Denies need for ongoing services, no further needs.  Has visits with PCP at least yearly, last visit on 5/18.    SDOH assessments and interventions completed:  Yes  SDOH Interventions Today    Flowsheet Row Most Recent Value  SDOH Interventions   Food Insecurity Interventions Intervention Not Indicated  Financial Strain Interventions Intervention Not Indicated  Housing Interventions Intervention Not Indicated  Transportation Interventions Intervention Not Indicated        Care Coordination Interventions Activated:  Yes  Care Coordination Interventions:  Yes, provided   Follow up plan: No further intervention required.   Encounter Outcome:  Pt. Visit Completed   Kemper Durie, RN, MSN, [facility] Care Coordinator [phone]

## 2022-02-24 ENCOUNTER — Other Ambulatory Visit: Payer: Self-pay | Admitting: "Endocrinology

## 2022-03-03 ENCOUNTER — Other Ambulatory Visit: Payer: Self-pay | Admitting: "Endocrinology

## 2022-03-04 ENCOUNTER — Other Ambulatory Visit: Payer: Self-pay | Admitting: "Endocrinology

## 2022-03-18 ENCOUNTER — Other Ambulatory Visit: Payer: Self-pay | Admitting: "Endocrinology

## 2022-03-27 ENCOUNTER — Encounter: Payer: Self-pay | Admitting: "Endocrinology

## 2022-03-27 ENCOUNTER — Ambulatory Visit (INDEPENDENT_AMBULATORY_CARE_PROVIDER_SITE_OTHER): Payer: BC Managed Care – PPO | Admitting: "Endocrinology

## 2022-03-27 VITALS — BP 118/86 | HR 76 | Ht 71.0 in | Wt 293.8 lb

## 2022-03-27 DIAGNOSIS — I10 Essential (primary) hypertension: Secondary | ICD-10-CM | POA: Diagnosis not present

## 2022-03-27 DIAGNOSIS — E782 Mixed hyperlipidemia: Secondary | ICD-10-CM | POA: Diagnosis not present

## 2022-03-27 DIAGNOSIS — E559 Vitamin D deficiency, unspecified: Secondary | ICD-10-CM

## 2022-03-27 DIAGNOSIS — E1159 Type 2 diabetes mellitus with other circulatory complications: Secondary | ICD-10-CM | POA: Diagnosis not present

## 2022-03-27 LAB — POCT GLYCOSYLATED HEMOGLOBIN (HGB A1C): HbA1c, POC (controlled diabetic range): 7 % (ref 0.0–7.0)

## 2022-03-27 MED ORDER — TOUJEO SOLOSTAR 300 UNIT/ML ~~LOC~~ SOPN: 60.0000 [IU] | PEN_INJECTOR | Freq: Every day | SUBCUTANEOUS | 1 refills | Status: DC

## 2022-03-27 NOTE — Progress Notes (Addendum)
03/27/2022   Endocrinology follow-up note    Subjective:    [name], male    DOB: February 02, 1966.  he is being seen in follow-up after he was seen in consultation for  management of currently uncontrolled symptomatic uncontrolled type 2 diabetes, hyperlipidemia, hypertension.  PMD:   Elfredia Nevins, MD.   Past Medical History:  Diagnosis Date   CAD (coronary artery disease), native coronary artery    10/18 PCI/DES mLAD, normal EF on LV gram   Diabetes mellitus    Type 2 diagnosed 2 weeks ago   Hiatal hernia with gastroesophageal reflux    Hypertension    Mixed hyperlipidemia    Morbid obesity (HCC)    OSA on CPAP    Urinary frequency    Past Surgical History:  Procedure Laterality Date   CIRCUMCISION     1988   CORONARY STENT INTERVENTION N/A 03/16/2017   Procedure: CORONARY STENT INTERVENTION;  Surgeon: Lyn Records, MD;  Location: [facility];  Service: Cardiovascular;  Laterality: N/A;   ESOPHAGEAL DILATION N/A 02/08/2015   Procedure: ESOPHAGEAL DILATION;  Surgeon: Malissa Hippo, MD;  Location: [facility];  Service: Endoscopy;  Laterality: N/A;   ESOPHAGOGASTRODUODENOSCOPY N/A 02/08/2015   Procedure: ESOPHAGOGASTRODUODENOSCOPY (EGD);  Surgeon: Malissa Hippo, MD;  Location: [facility];  Service: Endoscopy;  Laterality: N/A;  1055   ESOPHAGOGASTRODUODENOSCOPY (EGD) WITH ESOPHAGEAL DILATION  03/29/2012   Procedure: ESOPHAGOGASTRODUODENOSCOPY (EGD) WITH ESOPHAGEAL DILATION;  Surgeon: Malissa Hippo, MD;  Location: [facility];  Service: Endoscopy;  Laterality: N/A;  730   LEFT HEART CATH AND CORONARY ANGIOGRAPHY N/A 03/16/2017   Procedure: LEFT HEART CATH AND CORONARY ANGIOGRAPHY;  Surgeon: Lyn Records, MD;  Location: [facility];  Service: Cardiovascular;  Laterality: N/A;   ULTRASOUND GUIDANCE FOR VASCULAR ACCESS  03/16/2017   Procedure: Ultrasound Guidance For Vascular Access;  Surgeon: Lyn Records, MD;  Location: [facility]  INVASIVE CV LAB;  Service: Cardiovascular;;   Social History   Socioeconomic History   Marital status: Married    Spouse name: Not on file   Number of children: Not on file   Years of education: Not on file   Highest education level: Not on file  Occupational History   Not on file  Tobacco Use   Smoking status: Never   Smokeless tobacco: Never  Vaping Use   Vaping Use: Never used  Substance and Sexual Activity   Alcohol use: No   Drug use: No   Sexual activity: Not on file  Other Topics Concern   Not on file  Social History Narrative   Drinks caffeine 4 times a week.      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   Social Determinants of Health   Financial Resource Strain: Low Risk  (01/16/2022)   Overall Financial Resource Strain (CARDIA)    Difficulty of Paying Living Expenses: Not hard at all  Food Insecurity: No Food Insecurity (01/16/2022)   Hunger Vital Sign    Worried About Running Out of Food in the Last Year: Never true    Ran Out of Food in the Last Year: Never true  Transportation Needs: No Transportation Needs (01/16/2022)   PRAPARE - Administrator, Civil Service (Medical): No    Lack of Transportation (Non-Medical): No  Physical Activity: Not on file  Stress: Not on file  Social Connections: Not on file   Outpatient Encounter Medications as of 03/27/2022  Medication  Sig   aspirin 81 MG chewable tablet Chew 1 tablet (81 mg total) by mouth daily.   atorvastatin (LIPITOR) 40 MG tablet TAKE 1 TABLET BY MOUTH ONCE DAILY AT  6  PM   Cholecalciferol (VITAMIN D3) 125 MCG (5000 UT) CAPS Take 1 capsule (5,000 Units total) by mouth daily.   Continuous Blood Gluc Receiver (FREESTYLE LIBRE 14 DAY READER) DEVI Use to check blood glucose 4 times daily.   Continuous Blood Gluc Sensor (FREESTYLE LIBRE 3 SENSOR) MISC PLACE 1 SENSOR ON THE SKIN EVERY 14 DAYS TO CHECK GLUCOSE CONTINUOUSLY   cyclobenzaprine (FLEXERIL) 10 MG tablet Take 1 tablet (10 mg total) by mouth 2 (two) times  daily as needed for muscle spasms.   FARXIGA 5 MG TABS tablet TAKE 1 TABLET BY MOUTH ONCE DAILY BEFORE BREAKFAST   ibuprofen (ADVIL) 800 MG tablet Take 800 mg by mouth 3 (three) times daily.   insulin glargine, 1 Unit Dial, (TOUJEO SOLOSTAR) 300 UNIT/ML Solostar Pen Inject 60 Units into the skin at bedtime.   Insulin Pen Needle (B-D ULTRAFINE III SHORT PEN) 31G X 8 MM MISC USE 1 PEN NEEDLE DAILY AS DIRECTED   lisinopril (ZESTRIL) 20 MG tablet Take 1 tablet by mouth once daily   Melatonin 2.5 MG CAPS Take 2.5 mg by mouth at bedtime as needed (sleep).   metFORMIN (GLUCOPHAGE-XR) 500 MG 24 hr tablet Take 2 tablets (1,000 mg total) by mouth daily with breakfast.   nitroGLYCERIN (NITROSTAT) 0.4 MG SL tablet Place 1 tablet (0.4 mg total) under the tongue every 5 (five) minutes as needed.   sildenafil (VIAGRA) 100 MG tablet Take 100 mg by mouth as needed.   Vitamin D, Ergocalciferol, (DRISDOL) 1.25 MG (50000 UNIT) CAPS capsule Take 1 capsule (50,000 Units total) by mouth every 7 (seven) days.   [DISCONTINUED] TOUJEO SOLOSTAR 300 UNIT/ML Solostar Pen INJECT 70 UNITS SUBCUTANEOUSLY AT BEDTIME (Patient taking differently: 50 Units at bedtime.)   No facility-administered encounter medications on file as of 03/27/2022.    ALLERGIES: Allergies  Allergen Reactions   Bee Venom    Shellfish Allergy Swelling    Turns red   Strawberry Extract Rash    VACCINATION STATUS: Immunization History  Administered Date(s) Administered   Moderna Sars-Covid-2 Vaccination 08/24/2019, 09/27/2019    Diabetes He presents for his follow-up diabetic visit. He has type 2 diabetes mellitus. Onset time: He reports that he was diagnosed at approximate age of 56 years. His disease course has been worsening. There are no hypoglycemic associated symptoms. Pertinent negatives for hypoglycemia include no confusion, headaches, pallor, seizures or tremors. Pertinent negatives for diabetes include no blurred vision, no chest pain,  no fatigue, no polydipsia, no polyphagia, no polyuria and no weakness. There are no hypoglycemic complications. Symptoms are worsening. Diabetic complications include heart disease. Risk factors for coronary artery disease include dyslipidemia, diabetes mellitus, family history, hypertension, male sex, obesity and sedentary lifestyle. Current diabetic treatment includes insulin injections (Is currently on Toujeo 60 units nightly, Janumet 50/1000 mg p.o. daily, glipizide 5 g p.o. daily.). His weight is decreasing steadily (Overall achieved 38 pounds weight loss.). He is following a generally unhealthy diet. When asked about meal planning, he reported none. He has not had a previous visit with a dietitian. He participates in exercise intermittently. His home blood glucose trend is increasing steadily. His breakfast blood glucose range is generally 140-180 mg/dl. His lunch blood glucose range is generally 140-180 mg/dl. His dinner blood glucose range is generally 140-180 mg/dl. His bedtime  blood glucose range is generally 140-180 mg/dl. His overall blood glucose range is 140-180 mg/dl. Joel Ward Ward with slightly higher than target glycemic profile.  His point-of-care A1c is 7%.  Most recent 2 weeks average blood glucose is 177 mg per DL.  His AGP shows 57% time range, 38% level 1 hyperglycemia, 5% level 2 hyperglycemia.  He did not have any significant hypoglycemia.   After engaging with lifestyle medicine, this patient has achieved 42 pounds of weight loss.    ) An ACE inhibitor/angiotensin II receptor blocker is being taken. He does not see a podiatrist.Eye exam is not current.  Hyperlipidemia This is a chronic problem. The current episode started more than 1 year ago. The problem is controlled. Exacerbating diseases include diabetes and obesity. Pertinent negatives include no chest pain, myalgias or shortness of breath. Current antihyperlipidemic treatment includes statins. Risk factors for coronary  artery disease include dyslipidemia, diabetes mellitus, hypertension, male sex, obesity, a sedentary lifestyle and family history.  Hypertension This is a chronic problem. The problem is controlled. Pertinent negatives include no blurred vision, chest pain, headaches, neck pain, palpitations or shortness of breath. Risk factors for coronary artery disease include diabetes mellitus, dyslipidemia, obesity, male gender, sedentary lifestyle and family history. Past treatments include ACE inhibitors. Hypertensive end-organ damage includes CAD/MI.     Review of systems Limited as above.   Objective:    BP 118/86   Pulse 76   Ht 5\' 11"  (1.803 m)   Wt 293 lb 12.8 oz (133.3 kg)   BMI 40.98 kg/m   Wt Readings from Last 3 Encounters:  03/27/22 293 lb 12.8 oz (133.3 kg)  12/19/21 298 lb (135.2 kg)  09/19/21 (!) 314 lb (142.4 kg)      Physical Exam- Limited  Constitutional:  Body mass index is 40.98 kg/m. , not in acute distress, normal state of mind   CMP ( most recent) CMP     Component Value Date/Time   NA 140 12/12/2021 0910   K 3.7 12/12/2021 0910   CL 101 12/12/2021 0910   CO2 23 12/12/2021 0910   GLUCOSE 98 12/12/2021 0910   GLUCOSE 404 (H) 01/20/2020 0556   BUN 11 12/12/2021 0910   CREATININE 0.95 12/12/2021 0910   CREATININE 0.93 10/12/2019 0833   CALCIUM 9.4 12/12/2021 0910   PROT 6.9 12/12/2021 0910   ALBUMIN 4.2 12/12/2021 0910   AST 22 12/12/2021 0910   ALT 25 12/12/2021 0910   ALKPHOS 78 12/12/2021 0910   BILITOT 0.4 12/12/2021 0910   GFRNONAA >60 01/20/2020 0556   GFRNONAA 95 10/12/2019 0833   GFRAA >60 01/20/2020 0556   GFRAA 110 10/12/2019 0833     Diabetic Labs (most recent): Lab Results  Component Value Date   HGBA1C 7.0 03/27/2022   HGBA1C 5.7 12/19/2021   HGBA1C 6.7 09/19/2021   MICROALBUR 1.8 10/12/2019   MICROALBUR 0.5 03/01/2018     Lipid Panel ( most recent) Lipid Panel     Component Value Date/Time   CHOL 117 12/12/2021 0910   TRIG  97 12/12/2021 0910   HDL 45 12/12/2021 0910   CHOLHDL 2.6 12/12/2021 0910   CHOLHDL 3.0 10/12/2019 0833   VLDL 21 06/29/2017 0807   LDLCALC 54 12/12/2021 0910   LDLCALC 64 10/12/2019 0833     Assessment & Plan:   1. DM type 2 causing vascular disease (HCC) - Patient has currently uncontrolled symptomatic type 2 DM since  56 years of age.  Joel Ward with slightly higher  than target glycemic profile.  His point-of-care A1c is 7%.  Most recent 2 weeks average blood glucose is 177 mg per DL.  His AGP shows 57% time range, 38% level 1 hyperglycemia, 5% level 2 hyperglycemia.  He did not have any significant hypoglycemia.   After engaging with lifestyle medicine, this patient has achieved 42 pounds of weight loss.   Recent labs reviewed with him showing normal renal function.  -his diabetes is complicated by coronary artery disease with stent placement on 03/15/2017 and Joel Ward remains at extremely  high risk for more acute and chronic complications which include CAD, CVA, CKD, retinopathy, and neuropathy. These are all discussed in detail with the patient.  - I have counseled him on diet management and weight loss, by adopting a carbohydrate restricted/protein rich diet. -He did admits to dietary indiscretions including consumption of sweets and sweetened beverages.     He is engaged and benefiting from lifestyle medicine.  - he acknowledges that there is a room for improvement in his food and drink choices. - Suggestion is made for him to avoid simple carbohydrates  from his diet including Cakes, Sweet Desserts, Ice Cream, Soda (diet and regular), Sweet Tea, Candies, Chips, Cookies, Store Bought Juices, Alcohol , Artificial Sweeteners,  Coffee Creamer, and "Sugar-free" Products, Lemonade. This will help patient to have more stable blood glucose profile and potentially avoid unintended weight gain.  The following Lifestyle Medicine recommendations according to [place]  [facility]) were discussed and and offered to patient and he  agrees to start the journey:  A. Whole Foods, Plant-Based Nutrition comprising of fruits and vegetables, plant-based proteins, whole-grain carbohydrates was discussed in detail with the patient.   A list for source of those nutrients were also provided to the patient.  Patient will use only water or unsweetened tea for hydration. B.  The need to stay away from risky substances including alcohol, smoking; obtaining 7 to 9 hours of restorative sleep, at least 150 minutes of moderate intensity exercise weekly, the importance of healthy social connections,  and stress management techniques were discussed. C.  A full color page of  Calorie density of various food groups per pound showing examples of each food groups was provided to the patient.   - I encouraged him to switch to  unprocessed or minimally processed complex starch and increased protein intake (animal or plant source), fruits, and vegetables.  - he is advised to stick to a routine mealtimes to eat 3 meals  a day and avoid unnecessary snacks ( to snack only to correct hypoglycemia).   - he has been  scheduled with Jearld Fenton, RDN, CDE for individualized DM education- consult pending.  - I have approached him with the following individualized plan to manage diabetes and patient agrees:   -In light of his presentation with significant weight loss, he will not need prandial insulin.  He is advised to increase his Toujeo to 60 units nightly,    associated with continuous use of his CGM.  -He is advised to continue metformin 1000 mg XR p.o. daily at breakfast, Farxiga 5 mg p.o. daily at breakfast.  -He is encouraged to call clinic for hypoglycemia below 70 and hyperglycemia above 200 mg/dL.  2) BP/HTN: -Lifestyle medicine helping with blood pressure as well.  His blood pressure is controlled to target.  He is advised to continue  lisinopril to 20 mg  p.o. daily at breakfast.    3) Lipids/HPL: His  LDL is improving from 92 to 54.  This is mainly due to his recent change in diet.  He is advised to continue atorvastatin 40 mg p.o. nightly.  He will have fasting lipid panel before his next visit.     4)  Weight/Diet: His current BMI is   40.98-overall lost 42  pounds.   He is still a candidate for modest weight loss.  I discussed with him the fact that he would benefit the most from loss of 5 - 10% of his current body weight .  He is benefiting from whole food plant-based diet.     5) vitamin D deficiency: He still has profound vitamin D deficiency at 34. He is on ongoing supplement with vitamin D 2 50,000 units weekly.  He will have repeat 25-hydroxy vitamin D measured before his next visit.   6) Chronic Care/Health Maintenance:  -he  is on ACEI/ARB and Statin medications and  is encouraged to continue to follow up with Ophthalmology, Dentist,  Podiatrist at least yearly or according to recommendations, and advised to  stay away from smoking. I have recommended yearly flu vaccine and pneumonia vaccination at least every 5 years; moderate intensity exercise for up to 150 minutes weekly; and  sleep for at least 7 hours a day.  He had negative ABI for PAD in April 2022.  His study will be repeated in April 2027, or sooner if needed.   - I advised patient to maintain close follow up with Elfredia Nevins, MD for primary care needs.  I spent 41 minutes in the care of the patient today including review of labs from CMP, Lipids, Thyroid Function, Hematology (current and previous including abstractions from other facilities); face-to-face time discussing  his blood glucose readings/logs, discussing hypoglycemia and hyperglycemia episodes and symptoms, medications doses, his options of short and long term treatment based on the latest standards of care / guidelines;  discussion about incorporating lifestyle medicine;  and documenting the encounter. Risk  reduction counseling performed per USPSTF guidelines to reduce  obesity and cardiovascular risk factors.     Please refer to Patient Instructions for Blood Glucose Monitoring and Insulin/Medications Dosing Guide"  in media tab for additional information. Please  also refer to " Patient Self Inventory" in the Media  tab for reviewed elements of pertinent patient history.  Joel Ward participated in the discussions, expressed understanding, and voiced agreement with the above plans.  All questions were answered to his satisfaction. he is encouraged to contact clinic should he have any questions or concerns prior to his return visit.   Follow up plan: - Return in about 4 months (around 07/28/2022) for F/U with Pre-visit Labs, Meter/CGM/Logs, A1c here.  Marquis Lunch, MD Phone: [phone]  Fax: [phone]   03/27/2022, 11:59 AM This note was partially dictated with voice recognition software. Similar sounding words can be transcribed inadequately or may not  be corrected upon review.

## 2022-03-27 NOTE — Patient Instructions (Signed)

## 2022-04-03 ENCOUNTER — Telehealth: Payer: Self-pay | Admitting: "Endocrinology

## 2022-04-03 NOTE — Telephone Encounter (Signed)
Printed [name] report

## 2022-04-03 NOTE — Telephone Encounter (Signed)
Ever since he has been off Janument, his sugar has been spiking. Can you pull his [name] report?

## 2022-04-03 NOTE — Telephone Encounter (Signed)
Pt notified and agrees. 

## 2022-04-20 ENCOUNTER — Other Ambulatory Visit: Payer: Self-pay | Admitting: "Endocrinology

## 2022-04-26 ENCOUNTER — Other Ambulatory Visit: Payer: Self-pay | Admitting: "Endocrinology

## 2022-05-22 ENCOUNTER — Other Ambulatory Visit: Payer: Self-pay | Admitting: "Endocrinology

## 2022-05-28 ENCOUNTER — Other Ambulatory Visit: Payer: Self-pay | Admitting: "Endocrinology

## 2022-05-29 ENCOUNTER — Other Ambulatory Visit: Payer: Self-pay | Admitting: "Endocrinology

## 2022-06-13 ENCOUNTER — Other Ambulatory Visit: Payer: Self-pay | Admitting: "Endocrinology

## 2022-06-16 ENCOUNTER — Other Ambulatory Visit: Payer: Self-pay | Admitting: "Endocrinology

## 2022-06-20 ENCOUNTER — Other Ambulatory Visit: Payer: Self-pay | Admitting: "Endocrinology

## 2022-07-06 HISTORY — PX: COLONOSCOPY: SHX174

## 2022-07-11 ENCOUNTER — Other Ambulatory Visit: Payer: Self-pay | Admitting: "Endocrinology

## 2022-07-30 DIAGNOSIS — E782 Mixed hyperlipidemia: Secondary | ICD-10-CM | POA: Diagnosis not present

## 2022-07-30 DIAGNOSIS — E1159 Type 2 diabetes mellitus with other circulatory complications: Secondary | ICD-10-CM | POA: Diagnosis not present

## 2022-07-30 DIAGNOSIS — E559 Vitamin D deficiency, unspecified: Secondary | ICD-10-CM | POA: Diagnosis not present

## 2022-07-31 LAB — COMPREHENSIVE METABOLIC PANEL
ALT: 36 IU/L (ref 0–44)
AST: 29 IU/L (ref 0–40)
Albumin/Globulin Ratio: 1.5 (ref 1.2–2.2)
Albumin: 4.5 g/dL (ref 3.8–4.9)
Alkaline Phosphatase: 83 IU/L (ref 44–121)
BUN/Creatinine Ratio: 11 (ref 9–20)
BUN: 13 mg/dL (ref 6–24)
Bilirubin Total: 0.5 mg/dL (ref 0.0–1.2)
CO2: 27 mmol/L (ref 20–29)
Calcium: 9.7 mg/dL (ref 8.7–10.2)
Chloride: 105 mmol/L (ref 96–106)
Creatinine, Ser: 1.21 mg/dL (ref 0.76–1.27)
Globulin, Total: 3 g/dL (ref 1.5–4.5)
Glucose: 108 mg/dL — ABNORMAL HIGH (ref 70–99)
Potassium: 4.5 mmol/L (ref 3.5–5.2)
Sodium: 145 mmol/L — ABNORMAL HIGH (ref 134–144)
Total Protein: 7.5 g/dL (ref 6.0–8.5)
eGFR: 71 mL/min/{1.73_m2} (ref >59)

## 2022-07-31 LAB — TSH: TSH: 1.83 u[IU]/mL (ref 0.450–4.500)

## 2022-07-31 LAB — LIPID PANEL
Chol/HDL Ratio: 2.6 ratio (ref 0.0–5.0)
Cholesterol, Total: 131 mg/dL (ref 100–199)
HDL: 51 mg/dL (ref >39)
LDL Chol Calc (NIH): 62 mg/dL (ref 0–99)
Triglycerides: 94 mg/dL (ref 0–149)
VLDL Cholesterol Cal: 18 mg/dL (ref 5–40)

## 2022-07-31 LAB — T4, FREE: Free T4: 1.06 ng/dL (ref 0.82–1.77)

## 2022-07-31 LAB — VITAMIN B12: Vitamin B-12: 1368 pg/mL — ABNORMAL HIGH (ref 232–1245)

## 2022-07-31 LAB — VITAMIN D 25 HYDROXY (VIT D DEFICIENCY, FRACTURES): Vit D, 25-Hydroxy: 18.9 ng/mL — ABNORMAL LOW (ref 30.0–100.0)

## 2022-08-04 ENCOUNTER — Encounter: Payer: Self-pay | Admitting: "Endocrinology

## 2022-08-04 ENCOUNTER — Ambulatory Visit: Payer: BC Managed Care – PPO | Admitting: "Endocrinology

## 2022-08-04 VITALS — BP 136/84 | HR 72 | Ht 71.0 in | Wt 309.8 lb

## 2022-08-04 DIAGNOSIS — E559 Vitamin D deficiency, unspecified: Secondary | ICD-10-CM | POA: Diagnosis not present

## 2022-08-04 DIAGNOSIS — I10 Essential (primary) hypertension: Secondary | ICD-10-CM

## 2022-08-04 DIAGNOSIS — E782 Mixed hyperlipidemia: Secondary | ICD-10-CM

## 2022-08-04 DIAGNOSIS — E1159 Type 2 diabetes mellitus with other circulatory complications: Secondary | ICD-10-CM

## 2022-08-04 LAB — POCT GLYCOSYLATED HEMOGLOBIN (HGB A1C): HbA1c, POC (controlled diabetic range): 7.1 % — AB (ref 0.0–7.0)

## 2022-08-04 MED ORDER — DAPAGLIFLOZIN PROPANEDIOL 10 MG PO TABS: 10.0000 mg | ORAL_TABLET | Freq: Every day | ORAL | 1 refills | Status: DC

## 2022-08-04 NOTE — Patient Instructions (Signed)

## 2022-08-04 NOTE — Progress Notes (Signed)
08/04/2022   Endocrinology follow-up note    Subjective:    [name], male    DOB: 12/18/1965.  he is being seen in follow-up after he was seen in consultation for  management of currently uncontrolled symptomatic uncontrolled type 2 diabetes, hyperlipidemia, hypertension.  PMD:   Redmond School, MD.   Past Medical History:  Diagnosis Date   CAD (coronary artery disease), native coronary artery    10/18 PCI/DES mLAD, normal EF on LV gram   Diabetes mellitus    Type 2 diagnosed 2 weeks ago   Hiatal hernia with gastroesophageal reflux    Hypertension    Mixed hyperlipidemia    Morbid obesity (HCC)    OSA on CPAP    Urinary frequency    Past Surgical History:  Procedure Laterality Date   CIRCUMCISION     1988   CORONARY STENT INTERVENTION N/A 03/16/2017   Procedure: CORONARY STENT INTERVENTION;  Surgeon: Belva Crome, MD;  Location: [facility];  Service: Cardiovascular;  Laterality: N/A;   ESOPHAGEAL DILATION N/A 02/08/2015   Procedure: ESOPHAGEAL DILATION;  Surgeon: Rogene Houston, MD;  [facility];  Service: Endoscopy;  Laterality: N/A;   ESOPHAGOGASTRODUODENOSCOPY N/A 02/08/2015   Procedure: ESOPHAGOGASTRODUODENOSCOPY (EGD);  Surgeon: Rogene Houston, MD;  [facility];  Service: Endoscopy;  Laterality: N/A;  1055   ESOPHAGOGASTRODUODENOSCOPY (EGD) WITH ESOPHAGEAL DILATION  03/29/2012   Procedure: ESOPHAGOGASTRODUODENOSCOPY (EGD) WITH ESOPHAGEAL DILATION;  Surgeon: Rogene Houston, MD;  [facility];  Service: Endoscopy;  Laterality: N/A;  730   LEFT HEART CATH AND CORONARY ANGIOGRAPHY N/A 03/16/2017   Procedure: LEFT HEART CATH AND CORONARY ANGIOGRAPHY;  Surgeon: Belva Crome, MD;  Location: [facility];  Service: Cardiovascular;  Laterality: N/A;   ULTRASOUND GUIDANCE FOR VASCULAR ACCESS  03/16/2017   Procedure: Ultrasound Guidance For Vascular Access;  Surgeon: Belva Crome, MD;  Location: [facility];  Service: Cardiovascular;;   Social History   Socioeconomic History   Marital status: Married    Spouse name: Not on file   Number of children: Not on file   Years of education: Not on file   Highest education level: Not on file  Occupational History   Not on file  Tobacco Use   Smoking status: Never   Smokeless tobacco: Never  Vaping Use   Vaping Use: Never used  Substance and Sexual Activity   Alcohol use: No   Drug use: No   Sexual activity: Not on file  Other Topics Concern   Not on file  Social History Narrative   Drinks caffeine 4 times a week.      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   Social Determinants of Health   Financial Resource Strain: Low Risk  (01/16/2022)   Overall Financial Resource Strain (CARDIA)    Difficulty of Paying Living Expenses: Not hard at all  Food Insecurity: No Food Insecurity (01/16/2022)   Hunger Vital Sign    Worried About Running Out of Food in the Last Year: Never true    Ran Out of Food in the Last Year: Never true  Transportation Needs: No Transportation Needs (01/16/2022)   PRAPARE - Hydrologist (Medical): No    Lack of Transportation (Non-Medical): No  Physical Activity: Not on file  Stress: Not on file  Social Connections: Not on file   Outpatient Encounter Medications as of 08/04/2022  Medication  Sig   aspirin 81 MG chewable tablet Chew 1 tablet (81 mg total) by mouth daily.   atorvastatin (LIPITOR) 40 MG tablet TAKE 1 TABLET BY MOUTH ONCE DAILY AT  6  PM   Cholecalciferol (VITAMIN D3) 125 MCG (5000 UT) CAPS Take 1 capsule (5,000 Units total) by mouth daily.   Continuous Blood Gluc Receiver (FREESTYLE LIBRE 14 DAY READER) DEVI Use to check blood glucose 4 times daily.   Continuous Blood Gluc Sensor (FREESTYLE LIBRE 3 SENSOR) MISC PLACE 1 SENSOR ON THE SKIN EVERY 14 DAYS TO CHECK GLUCOSE CONTINUOUSLY.   cyclobenzaprine (FLEXERIL) 10 MG tablet Take 1 tablet (10 mg total) by mouth 2 (two) times  daily as needed for muscle spasms.   dapagliflozin propanediol (FARXIGA) 10 MG TABS tablet Take 1 tablet (10 mg total) by mouth daily before breakfast.   ibuprofen (ADVIL) 800 MG tablet Take 800 mg by mouth 3 (three) times daily.   insulin glargine, 1 Unit Dial, (TOUJEO SOLOSTAR) 300 UNIT/ML Solostar Pen Inject 70 Units into the skin at bedtime.   Insulin Pen Needle (B-D ULTRAFINE III SHORT PEN) 31G X 8 MM MISC USE 1 PEN NEEDLE DAILY AS DIRECTED   lisinopril (ZESTRIL) 20 MG tablet Take 1 tablet by mouth once daily   Melatonin 2.5 MG CAPS Take 2.5 mg by mouth at bedtime as needed (sleep).   metFORMIN (GLUCOPHAGE-XR) 500 MG 24 hr tablet TAKE 2 TABLETS BY MOUTH ONCE DAILY WITH BREAKFAST   nitroGLYCERIN (NITROSTAT) 0.4 MG SL tablet Place 1 tablet (0.4 mg total) under the tongue every 5 (five) minutes as needed.   sildenafil (VIAGRA) 100 MG tablet Take 100 mg by mouth as needed.   Vitamin D, Ergocalciferol, (DRISDOL) 1.25 MG (50000 UNIT) CAPS capsule Take 1 capsule by mouth once a week   [DISCONTINUED] FARXIGA 5 MG TABS tablet TAKE 1 TABLET BY MOUTH ONCE DAILY BEFORE BREAKFAST   No facility-administered encounter medications on file as of 08/04/2022.    ALLERGIES: Allergies  Allergen Reactions   Bee Venom    Shellfish Allergy Swelling    Turns red   Strawberry Extract Rash    VACCINATION STATUS: Immunization History  Administered Date(s) Administered   Moderna Sars-Covid-2 Vaccination 08/24/2019, 09/27/2019    Diabetes He presents for his follow-up diabetic visit. He has type 2 diabetes mellitus. Onset time: He reports that he was diagnosed at approximate age of 57 years. His disease course has been stable. There are no hypoglycemic associated symptoms. Pertinent negatives for hypoglycemia include no confusion, headaches, pallor, seizures or tremors. Pertinent negatives for diabetes include no blurred vision, no chest pain, no fatigue, no polydipsia, no polyphagia, no polyuria and no  weakness. There are no hypoglycemic complications. Symptoms are stable. Diabetic complications include heart disease. Risk factors for coronary artery disease include dyslipidemia, diabetes mellitus, family history, hypertension, male sex, obesity and sedentary lifestyle. Current diabetic treatment includes insulin injections (Is currently on Toujeo 60 units nightly, Janumet 50/1000 mg p.o. daily, glipizide 5 g p.o. daily.). His weight is increasing steadily (Overall achieved 38 pounds weight loss.). He is following a generally unhealthy diet. When asked about meal planning, he reported none. He has not had a previous visit with a dietitian. He participates in exercise intermittently. His home blood glucose trend is increasing steadily. His breakfast blood glucose range is generally 140-180 mg/dl. His lunch blood glucose range is generally 140-180 mg/dl. His dinner blood glucose range is generally 140-180 mg/dl. His bedtime blood glucose range is generally 140-180 mg/dl.  His overall blood glucose range is 140-180 mg/dl. [place] returns with slightly above target glycemic profile.  He is freestyle libre 3 AGP shows 58% time in range, 29% slightly above level 1 hyperglycemia, 13% level 2 hyperglycemia.  His point-of-care A1c is 7.1%.  Over the last 14 days, his average blood glucose 179 mg per DL.  He did not document any hypoglycemia.    After engaging with lifestyle medicine, this patient has achieved 40 pounds of weight loss.    ) An ACE inhibitor/angiotensin II receptor blocker is being taken. He does not see a podiatrist.Eye exam is not current.  Hyperlipidemia This is a chronic problem. The current episode started more than 1 year ago. The problem is controlled. Exacerbating diseases include diabetes and obesity. Pertinent negatives include no chest pain, myalgias or shortness of breath. Current antihyperlipidemic treatment includes statins. Risk factors for coronary artery disease include  dyslipidemia, diabetes mellitus, hypertension, male sex, obesity, a sedentary lifestyle and family history.  Hypertension This is a chronic problem. The problem is controlled. Pertinent negatives include no blurred vision, chest pain, headaches, neck pain, palpitations or shortness of breath. Risk factors for coronary artery disease include diabetes mellitus, dyslipidemia, obesity, male gender, sedentary lifestyle and family history. Past treatments include ACE inhibitors. Hypertensive end-organ damage includes CAD/MI.     Review of systems Limited as above.   Objective:    BP 136/84   Pulse 72   Ht '5\' 11"'$  (1.803 m)   Wt (!) 309 lb 12.8 oz (140.5 kg)   BMI 43.21 kg/m   Wt Readings from Last 3 Encounters:  08/04/22 (!) 309 lb 12.8 oz (140.5 kg)  03/27/22 293 lb 12.8 oz (133.3 kg)  12/19/21 298 lb (135.2 kg)      Physical Exam- Limited  Constitutional:  Body mass index is 43.21 kg/m. , not in acute distress, normal state of mind   CMP ( most recent) CMP     Component Value Date/Time   NA 145 (H) 07/30/2022 0816   K 4.5 07/30/2022 0816   CL 105 07/30/2022 0816   CO2 27 07/30/2022 0816   GLUCOSE 108 (H) 07/30/2022 0816   GLUCOSE 404 (H) 01/20/2020 0556   BUN 13 07/30/2022 0816   CREATININE 1.21 07/30/2022 0816   CREATININE 0.93 10/12/2019 0833   CALCIUM 9.7 07/30/2022 0816   PROT 7.5 07/30/2022 0816   ALBUMIN 4.5 07/30/2022 0816   AST 29 07/30/2022 0816   ALT 36 07/30/2022 0816   ALKPHOS 83 07/30/2022 0816   BILITOT 0.5 07/30/2022 0816   GFRNONAA >60 01/20/2020 0556   GFRNONAA 95 10/12/2019 0833   GFRAA >60 01/20/2020 0556   GFRAA 110 10/12/2019 0833     Diabetic Labs (most recent): Lab Results  Component Value Date   HGBA1C 7.1 (A) 08/04/2022   HGBA1C 7.0 03/27/2022   HGBA1C 5.7 12/19/2021   MICROALBUR 1.8 10/12/2019   MICROALBUR 0.5 03/01/2018     Lipid Panel ( most recent) Lipid Panel     Component Value Date/Time   CHOL 131 07/30/2022 0816    TRIG 94 07/30/2022 0816   HDL 51 07/30/2022 0816   CHOLHDL 2.6 07/30/2022 0816   CHOLHDL 3.0 10/12/2019 0833   VLDL 21 06/29/2017 0807   LDLCALC 62 07/30/2022 0816   LDLCALC 64 10/12/2019 0833     Assessment & Plan:   1. DM type 2 causing vascular disease ([place]) - Patient has currently uncontrolled symptomatic type 2 DM since  57 years of age.  Joel Ward returns with slightly above target glycemic profile.  He is freestyle libre 3 AGP shows 58% time in range, 29% slightly above level 1 hyperglycemia, 13% level 2 hyperglycemia.  His point-of-care A1c is 7.1%.  Over the last 14 days, his average blood glucose 179 mg per DL.  He did not document any hypoglycemia.    After engaging with lifestyle medicine, this patient has achieved 40 pounds of weight loss.   Recent labs reviewed with him showing normal renal function.  -his diabetes is complicated by coronary artery disease with stent placement on 03/15/2017 and Joel Ward remains at extremely  high risk for more acute and chronic complications which include CAD, CVA, CKD, retinopathy, and neuropathy. These are all discussed in detail with the patient.  - I have counseled him on diet management and weight loss, by adopting a carbohydrate restricted/protein rich diet. -He did admits to dietary indiscretions including consumption of sweets and sweetened beverages.     He is advised to reengage with lifestyle medicine.  - he acknowledges that there is a room for improvement in his food and drink choices. - Suggestion is made for him to avoid simple carbohydrates  from his diet including Cakes, Sweet Desserts, Ice Cream, Soda (diet and regular), Sweet Tea, Candies, Chips, Cookies, Store Bought Juices, Alcohol , Artificial Sweeteners,  Coffee Creamer, and "Sugar-free" Products, Lemonade. This will help patient to have more stable blood glucose profile and potentially avoid unintended weight gain.  The following Lifestyle Medicine  recommendations according to [facility]) were discussed and and offered to patient and he  agrees to start the journey:  A. Whole Foods, Plant-Based Nutrition comprising of fruits and vegetables, plant-based proteins, whole-grain carbohydrates was discussed in detail with the patient.   A list for source of those nutrients were also provided to the patient.  Patient will use only water or unsweetened tea for hydration. B.  The need to stay away from risky substances including alcohol, smoking; obtaining 7 to 9 hours of restorative sleep, at least 150 minutes of moderate intensity exercise weekly, the importance of healthy social connections,  and stress management techniques were discussed. C.  A full color page of  Calorie density of various food groups per pound showing examples of each food groups was provided to the patient.   - I encouraged him to switch to  unprocessed or minimally processed complex starch and increased protein intake (animal or plant source), fruits, and vegetables.  - he is advised to stick to a routine mealtimes to eat 3 meals  a day and avoid unnecessary snacks ( to snack only to correct hypoglycemia).   - he has been  scheduled with Jearld Fenton, RDN, CDE for individualized DM education- consult pending.  - I have approached him with the following individualized plan to manage diabetes and patient agrees:   -In light of his presentation with above target glycemic profile, he will need higher dose of Farxiga.  He is advised to increase his [place] to 10 mg p.o. daily at breakfast, continue Toujeo 60 units nightly, metformin 1000 mg p.o. daily at breakfast.    -He is encouraged to call clinic for hypoglycemia below 70 and hyperglycemia above 200 mg/dL.  2) BP/HTN: -Lifestyle medicine helping with blood pressure as well.  His blood pressure is controlled to target.  He is advised to continue  lisinopril to 20 mg p.o. daily at  breakfast.    3) Lipids/HPL: His  LDL is improving to 62.   This is mainly due to his recent change in diet.  He is advised to continue atorvastatin 40 mg p.o. nightly.  He will have fasting lipid panel before his next visit.     4)  Weight/Diet: His current BMI is 43.1-  He is still a candidate for modest weight loss.  I discussed with him the fact that he would benefit the most from loss of 5 - 10% of his current body weight .  He is benefiting from whole food plant-based diet.     5) vitamin D deficiency: He still has profound vitamin D deficiency at 2. He is on ongoing supplement with vitamin D 2 50,000 units weekly.  He will have repeat 25-hydroxy vitamin D measured before his next visit.   6) Chronic Care/Health Maintenance:  -he  is on ACEI/ARB and Statin medications and  is encouraged to continue to follow up with Ophthalmology, Dentist,  Podiatrist at least yearly or according to recommendations, and advised to  stay away from smoking. I have recommended yearly flu vaccine and pneumonia vaccination at least every 5 years; moderate intensity exercise for up to 150 minutes weekly; and  sleep for at least 7 hours a day.  He had negative ABI for PAD in April 2022.  His study will be repeated in April 2027, or sooner if needed.   - I advised patient to maintain close follow up with Redmond School, MD for primary care needs.  I spent  26  minutes in the care of the patient today including review of labs from [facility], Lipids, Thyroid Function, Hematology (current and previous including abstractions from other facilities); face-to-face time discussing  his blood glucose readings/logs, discussing hypoglycemia and hyperglycemia episodes and symptoms, medications doses, his options of short and long term treatment based on the latest standards of care / guidelines;  discussion about incorporating lifestyle medicine;  and documenting the encounter. Risk reduction counseling performed per USPSTF  guidelines to reduce  obesity and cardiovascular risk factors.     Please refer to Patient Instructions for Blood Glucose Monitoring and Insulin/Medications Dosing Guide"  in media tab for additional information. Please  also refer to " Patient Self Inventory" in the Media  tab for reviewed elements of pertinent patient history.  Joel Ward participated in the discussions, expressed understanding, and voiced agreement with the above plans.  All questions were answered to his satisfaction. he is encouraged to contact clinic should he have any questions or concerns prior to his return visit.    Follow up plan: - Return in about 4 months (around 12/04/2022) for Bring Meter/CGM Device/Logs- A1c in Office.  Joel Lloyd, MD Phone: [phone]  Fax: [phone]   08/04/2022, 6:45 PM This note was partially dictated with voice recognition software. Similar sounding words can be transcribed inadequately or may not  be corrected upon review.

## 2022-08-08 ENCOUNTER — Other Ambulatory Visit: Payer: Self-pay | Admitting: "Endocrinology

## 2022-08-16 ENCOUNTER — Other Ambulatory Visit: Payer: Self-pay | Admitting: "Endocrinology

## 2022-08-31 ENCOUNTER — Other Ambulatory Visit: Payer: Self-pay | Admitting: "Endocrinology

## 2022-09-02 ENCOUNTER — Other Ambulatory Visit: Payer: Self-pay | Admitting: "Endocrinology

## 2022-09-23 ENCOUNTER — Other Ambulatory Visit: Payer: Self-pay | Admitting: "Endocrinology

## 2022-10-05 ENCOUNTER — Other Ambulatory Visit: Payer: Self-pay | Admitting: "Endocrinology

## 2022-10-07 DIAGNOSIS — J0100 Acute maxillary sinusitis, unspecified: Secondary | ICD-10-CM | POA: Diagnosis not present

## 2022-10-07 DIAGNOSIS — Z6841 Body Mass Index (BMI) 40.0 and over, adult: Secondary | ICD-10-CM | POA: Diagnosis not present

## 2022-10-07 DIAGNOSIS — J209 Acute bronchitis, unspecified: Secondary | ICD-10-CM | POA: Diagnosis not present

## 2022-10-07 DIAGNOSIS — E1165 Type 2 diabetes mellitus with hyperglycemia: Secondary | ICD-10-CM | POA: Diagnosis not present

## 2022-10-07 DIAGNOSIS — E1129 Type 2 diabetes mellitus with other diabetic kidney complication: Secondary | ICD-10-CM | POA: Diagnosis not present

## 2022-11-17 ENCOUNTER — Other Ambulatory Visit: Payer: Self-pay | Admitting: "Endocrinology

## 2022-11-21 ENCOUNTER — Other Ambulatory Visit: Payer: Self-pay | Admitting: "Endocrinology

## 2022-12-06 ENCOUNTER — Other Ambulatory Visit: Payer: Self-pay | Admitting: "Endocrinology

## 2022-12-10 ENCOUNTER — Encounter: Payer: Self-pay | Admitting: "Endocrinology

## 2022-12-10 ENCOUNTER — Ambulatory Visit: Payer: BC Managed Care – PPO | Admitting: "Endocrinology

## 2022-12-10 VITALS — BP 134/88 | HR 68 | Ht 71.0 in | Wt 302.2 lb

## 2022-12-10 DIAGNOSIS — I10 Essential (primary) hypertension: Secondary | ICD-10-CM

## 2022-12-10 DIAGNOSIS — E1159 Type 2 diabetes mellitus with other circulatory complications: Secondary | ICD-10-CM | POA: Diagnosis not present

## 2022-12-10 DIAGNOSIS — E559 Vitamin D deficiency, unspecified: Secondary | ICD-10-CM

## 2022-12-10 DIAGNOSIS — E782 Mixed hyperlipidemia: Secondary | ICD-10-CM

## 2022-12-10 DIAGNOSIS — Z794 Long term (current) use of insulin: Secondary | ICD-10-CM

## 2022-12-10 LAB — POCT GLYCOSYLATED HEMOGLOBIN (HGB A1C): HbA1c, POC (controlled diabetic range): 7.1 % — AB (ref 0.0–7.0)

## 2022-12-10 MED ORDER — TOUJEO SOLOSTAR 300 UNIT/ML ~~LOC~~ SOPN: 80.0000 [IU] | PEN_INJECTOR | Freq: Every evening | SUBCUTANEOUS | 1 refills | Status: DC

## 2022-12-10 NOTE — Progress Notes (Signed)
12/10/2022   Endocrinology follow-up note   Subjective:    [name], male    DOB: 1965/08/26.  he is being seen in follow-up after he was seen in consultation for  management of currently uncontrolled symptomatic uncontrolled type 2 diabetes, hyperlipidemia, hypertension.  PMD:   Elfredia Nevins, MD.   Past Medical History:  Diagnosis Date   CAD (coronary artery disease), native coronary artery    10/18 PCI/DES mLAD, normal EF on LV gram   Diabetes mellitus    Type 2 diagnosed 2 weeks ago   Hiatal hernia with gastroesophageal reflux    Hypertension    Mixed hyperlipidemia    Morbid obesity (HCC)    OSA on CPAP    Urinary frequency    Past Surgical History:  Procedure Laterality Date   CIRCUMCISION     1988   CORONARY STENT INTERVENTION N/A 03/16/2017   Procedure: CORONARY STENT INTERVENTION;  Surgeon: Lyn Records, MD;  Location: [facility];  Service: Cardiovascular;  Laterality: N/A;   ESOPHAGEAL DILATION N/A 02/08/2015   Procedure: ESOPHAGEAL DILATION;  Surgeon: Malissa Hippo, MD;  Location: [facility];  Service: Endoscopy;  Laterality: N/A;   ESOPHAGOGASTRODUODENOSCOPY N/A 02/08/2015   Procedure: ESOPHAGOGASTRODUODENOSCOPY (EGD);  Surgeon: Malissa Hippo, MD;  Location: [facility];  Service: Endoscopy;  Laterality: N/A;  1055   ESOPHAGOGASTRODUODENOSCOPY (EGD) WITH ESOPHAGEAL DILATION  03/29/2012   Procedure: ESOPHAGOGASTRODUODENOSCOPY (EGD) WITH ESOPHAGEAL DILATION;  Surgeon: Malissa Hippo, MD;  Location: [facility];  Service: Endoscopy;  Laterality: N/A;  730   LEFT HEART CATH AND CORONARY ANGIOGRAPHY N/A 03/16/2017   Procedure: LEFT HEART CATH AND CORONARY ANGIOGRAPHY;  Surgeon: Lyn Records, MD;  Location: [facility];  Service: Cardiovascular;  Laterality: N/A;   ULTRASOUND GUIDANCE FOR VASCULAR ACCESS  03/16/2017   Procedure: Ultrasound Guidance For Vascular Access;  Surgeon: Lyn Records, MD;  Location: [facility]  INVASIVE CV LAB;  Service: Cardiovascular;;   Social History   Socioeconomic History   Marital status: Married    Spouse name: Not on file   Number of children: Not on file   Years of education: Not on file   Highest education level: Not on file  Occupational History   Not on file  Tobacco Use   Smoking status: Never   Smokeless tobacco: Never  Vaping Use   Vaping status: Never Used  Substance and Sexual Activity   Alcohol use: No   Drug use: No   Sexual activity: Not on file  Other Topics Concern   Not on file  Social History Narrative   Drinks caffeine 4 times a week.      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   Social Determinants of Health   Financial Resource Strain: Low Risk  (01/16/2022)   Overall Financial Resource Strain (CARDIA)    Difficulty of Paying Living Expenses: Not hard at all  Food Insecurity: No Food Insecurity (01/16/2022)   Hunger Vital Sign    Worried About Running Out of Food in the Last Year: Never true    Ran Out of Food in the Last Year: Never true  Transportation Needs: No Transportation Needs (01/16/2022)   PRAPARE - Administrator, Civil Service (Medical): No    Lack of Transportation (Non-Medical): No  Physical Activity: Not on file  Stress: Not on file  Social Connections: Not on file   Outpatient Encounter Medications as of 12/10/2022  Medication Sig  aspirin 81 MG chewable tablet Chew 1 tablet (81 mg total) by mouth daily.   atorvastatin (LIPITOR) 40 MG tablet TAKE 1 TABLET BY MOUTH ONCE DAILY AT  6  PM   Cholecalciferol (VITAMIN D3) 125 MCG (5000 UT) CAPS Take 1 capsule (5,000 Units total) by mouth daily.   Continuous Blood Gluc Receiver (FREESTYLE LIBRE 14 DAY READER) DEVI Use to check blood glucose 4 times daily.   Continuous Glucose Sensor (FREESTYLE LIBRE 3 SENSOR) MISC USE TO CHECK GLUCOSE CONTINUOUSLY. CHANGE SENSOR EVERY 14 DAYS   cyclobenzaprine (FLEXERIL) 10 MG tablet Take 1 tablet (10 mg total) by mouth 2 (two) times daily as  needed for muscle spasms.   dapagliflozin propanediol (FARXIGA) 10 MG TABS tablet Take 1 tablet (10 mg total) by mouth daily before breakfast.   dapagliflozin propanediol (FARXIGA) 10 MG TABS tablet Take 1 tablet (10 mg total) by mouth daily before breakfast.   ibuprofen (ADVIL) 800 MG tablet Take 800 mg by mouth 3 (three) times daily.   insulin glargine, 1 Unit Dial, (TOUJEO SOLOSTAR) 300 UNIT/ML Solostar Pen Inject 80 Units into the skin at bedtime.   Insulin Pen Needle (B-D ULTRAFINE III SHORT PEN) 31G X 8 MM MISC USE 1 PEN NEEDLE DAILY AS DIRECTED   lisinopril (ZESTRIL) 20 MG tablet Take 1 tablet by mouth once daily   Melatonin 2.5 MG CAPS Take 2.5 mg by mouth at bedtime as needed (sleep).   metFORMIN (GLUCOPHAGE-XR) 500 MG 24 hr tablet TAKE 2 TABLETS BY MOUTH ONCE DAILY WITH BREAKFAST   nitroGLYCERIN (NITROSTAT) 0.4 MG SL tablet Place 1 tablet (0.4 mg total) under the tongue every 5 (five) minutes as needed.   sildenafil (VIAGRA) 100 MG tablet Take 100 mg by mouth as needed.   Vitamin D, Ergocalciferol, (DRISDOL) 1.25 MG (50000 UNIT) CAPS capsule Take 1 capsule by mouth once a week   [DISCONTINUED] TOUJEO SOLOSTAR 300 UNIT/ML Solostar Pen INJECT 60 UNITS SUBCUTANEOUSLY AT BEDTIME (Patient taking differently: 72 Units.)   No facility-administered encounter medications on file as of 12/10/2022.    ALLERGIES: Allergies  Allergen Reactions   Bee Venom    Shellfish Allergy Swelling    Turns red   Strawberry Extract Rash    VACCINATION STATUS: Immunization History  Administered Date(s) Administered   Moderna Sars-Covid-2 Vaccination 08/24/2019, 09/27/2019    Diabetes He presents for his follow-up diabetic visit. He has type 2 diabetes mellitus. Onset time: He reports that he was diagnosed at approximate age of 27 years. His disease course has been stable. There are no hypoglycemic associated symptoms. Pertinent negatives for hypoglycemia include no confusion, headaches, pallor,  seizures or tremors. Pertinent negatives for diabetes include no blurred vision, no chest pain, no fatigue, no polydipsia, no polyphagia, no polyuria and no weakness. There are no hypoglycemic complications. Symptoms are stable. Diabetic complications include heart disease. Risk factors for coronary artery disease include dyslipidemia, diabetes mellitus, family history, hypertension, male sex, obesity and sedentary lifestyle. Current diabetic treatment includes insulin injections (Is currently on Toujeo 60 units nightly, Janumet 50/1000 mg p.o. daily, glipizide 5 g p.o. daily.). His weight is fluctuating minimally (Overall achieved 38 pounds weight loss.). He is following a generally unhealthy diet. When asked about meal planning, he reported none. He has not had a previous visit with a dietitian. He participates in exercise intermittently. His home blood glucose trend is fluctuating minimally. His breakfast blood glucose range is generally 180-200 mg/dl. His lunch blood glucose range is generally 180-200 mg/dl. His dinner  blood glucose range is generally 180-200 mg/dl. His bedtime blood glucose range is generally 180-200 mg/dl. His overall blood glucose range is 180-200 mg/dl. Joel Ward returns with similar glycemic profile as at his last visit.  He is freestyle libre device AGP shows 48% time in range, 34% 1 hyperglycemia, 18% level 2 hyperglycemia.  He does not have hypoglycemia.  His average blood glucose is 191 for the last 14 days.  His point-of-care A1c is 7.1% unchanged from his last visit.      ) An ACE inhibitor/angiotensin II receptor blocker is being taken. He does not see a podiatrist.Eye exam is not current.  Hyperlipidemia This is a chronic problem. The current episode started more than 1 year ago. The problem is controlled. Exacerbating diseases include diabetes and obesity. Pertinent negatives include no chest pain, myalgias or shortness of breath. Current antihyperlipidemic treatment  includes statins. Risk factors for coronary artery disease include dyslipidemia, diabetes mellitus, hypertension, male sex, obesity, a sedentary lifestyle and family history.  Hypertension This is a chronic problem. The problem is controlled. Pertinent negatives include no blurred vision, chest pain, headaches, neck pain, palpitations or shortness of breath. Risk factors for coronary artery disease include diabetes mellitus, dyslipidemia, obesity, male gender, sedentary lifestyle and family history. Past treatments include ACE inhibitors. Hypertensive end-organ damage includes CAD/MI.     Review of systems Limited as above.   Objective:    BP 134/88 Comment: L arm with manuel cuff  Pulse 68   Ht 5\' 11"  (1.803 m)   Wt (!) 302 lb 3.2 oz (137.1 kg)   BMI 42.15 kg/m   Wt Readings from Last 3 Encounters:  12/10/22 (!) 302 lb 3.2 oz (137.1 kg)  08/04/22 (!) 309 lb 12.8 oz (140.5 kg)  03/27/22 293 lb 12.8 oz (133.3 kg)      Physical Exam- Limited  Constitutional:  Body mass index is 42.15 kg/m. , not in acute distress, normal state of mind   CMP ( most recent) CMP     Component Value Date/Time   NA 145 (H) 07/30/2022 0816   K 4.5 07/30/2022 0816   CL 105 07/30/2022 0816   CO2 27 07/30/2022 0816   GLUCOSE 108 (H) 07/30/2022 0816   GLUCOSE 404 (H) 01/20/2020 0556   BUN 13 07/30/2022 0816   CREATININE 1.21 07/30/2022 0816   CREATININE 0.93 10/12/2019 0833   CALCIUM 9.7 07/30/2022 0816   PROT 7.5 07/30/2022 0816   ALBUMIN 4.5 07/30/2022 0816   AST 29 07/30/2022 0816   ALT 36 07/30/2022 0816   ALKPHOS 83 07/30/2022 0816   BILITOT 0.5 07/30/2022 0816   GFRNONAA >60 01/20/2020 0556   GFRNONAA 95 10/12/2019 0833   GFRAA >60 01/20/2020 0556   GFRAA 110 10/12/2019 0833     Diabetic Labs (most recent): Lab Results  Component Value Date   HGBA1C 7.1 (A) 12/10/2022   HGBA1C 7.1 (A) 08/04/2022   HGBA1C 7.0 03/27/2022   MICROALBUR 1.8 10/12/2019   MICROALBUR 0.5 03/01/2018      Lipid Panel ( most recent) Lipid Panel     Component Value Date/Time   CHOL 131 07/30/2022 0816   TRIG 94 07/30/2022 0816   HDL 51 07/30/2022 0816   CHOLHDL 2.6 07/30/2022 0816   CHOLHDL 3.0 10/12/2019 0833   VLDL 21 06/29/2017 0807   LDLCALC 62 07/30/2022 0816   LDLCALC 64 10/12/2019 0833     Assessment & Plan:   1. DM type 2 causing vascular disease (HCC) - Patient has  currently uncontrolled symptomatic type 2 DM since  57 years of age.  Joel Ward returns with similar glycemic profile as at his last visit.  He is freestyle libre device AGP shows 48% time in range, 34% 1 hyperglycemia, 18% level 2 hyperglycemia.  He does not have hypoglycemia.  His average blood glucose is 191 for the last 14 days.  His point-of-care A1c is 7.1% unchanged from his last visit.      Recent labs reviewed with him showing normal renal function.  -his diabetes is complicated by coronary artery disease with stent placement on 03/15/2017 and Joel Ward remains at extremely  high risk for more acute and chronic complications which include CAD, CVA, CKD, retinopathy, and neuropathy. These are all discussed in detail with the patient.  - I have counseled him on diet management and weight loss, by adopting a carbohydrate restricted/protein rich diet. -He did admits to dietary indiscretions including consumption of sweets and sweetened beverages.     He is advised to reengage with lifestyle medicine.   - he acknowledges that there is a room for improvement in his food and drink choices. - Suggestion is made for him to avoid simple carbohydrates  from his diet including Cakes, Sweet Desserts, Ice Cream, Soda (diet and regular), Sweet Tea, Candies, Chips, Cookies, Store Bought Juices, Alcohol , Artificial Sweeteners,  Coffee Creamer, and "Sugar-free" Products, Lemonade. This will help patient to have more stable blood glucose profile and potentially avoid unintended weight gain.  The following  Lifestyle Medicine recommendations according to American College of Lifestyle Medicine  [facility]) were discussed and and offered to patient and he  agrees to start the journey:  A. Whole Foods, Plant-Based Nutrition comprising of fruits and vegetables, plant-based proteins, whole-grain carbohydrates was discussed in detail with the patient.   A list for source of those nutrients were also provided to the patient.  Patient will use only water or unsweetened tea for hydration. B.  The need to stay away from risky substances including alcohol, smoking; obtaining 7 to 9 hours of restorative sleep, at least 150 minutes of moderate intensity exercise weekly, the importance of healthy social connections,  and stress management techniques were discussed. C.  A full color page of  Calorie density of various food groups per pound showing examples of each food groups was provided to the patient.   - I encouraged him to switch to  unprocessed or minimally processed complex starch and increased protein intake (animal or plant source), fruits, and vegetables.  - he is advised to stick to a routine mealtimes to eat 3 meals  a day and avoid unnecessary snacks ( to snack only to correct hypoglycemia).   - he has been  scheduled with Norm Salt, RDN, CDE for individualized DM education- consult pending.  - I have approached him with the following individualized plan to manage diabetes and patient agrees:   -In light of his presentation with above target glycemic profile, I discussed and increase his Toujeo to 80 units nightly.  He is advised to continue Farxiga 10 mg p.o. daily at breakfast.  - Continue metformin 1000 mg p.o. XR p.o. daily at breakfast.   -He is encouraged to continue to use his CGM at all times.  -He is encouraged to call clinic for hypoglycemia below 70 and hyperglycemia above 200 mg/dL.  2) BP/HTN: -His blood pressure is controlled to target.   He is advised to continue  lisinopril to 20 mg  p.o. daily at  breakfast.    3) Lipids/HPL: His LDL is improving to 62.   This is mainly due to his recent change in diet.  He is advised to continue atorvastatin 40 mg p.o. nightly.    He will have fasting lipid panel before his next visit.     4)  Weight/Diet: His current BMI is 42.15- He is still a candidate for modest weight loss.  I discussed with him the fact that he would benefit the most from loss of 5 - 10% of his current body weight .  He is benefiting from whole food plant-based diet.     5) vitamin D deficiency:  He is on ongoing supplement with vitamin D 2 50,000 units weekly.  He will have repeat 25-hydroxy vitamin D measured before his next visit.   6) Chronic Care/Health Maintenance:  -he  is on ACEI/ARB and Statin medications and  is encouraged to continue to follow up with Ophthalmology, Dentist,  Podiatrist at least yearly or according to recommendations, and advised to  stay away from smoking. I have recommended yearly flu vaccine and pneumonia vaccination at least every 5 years; moderate intensity exercise for up to 150 minutes weekly; and  sleep for at least 7 hours a day.  He had negative ABI for PAD in April 2022.  His study will be repeated in April 2027, or sooner if needed.   - I advised patient to maintain close follow up with Elfredia Nevins, MD for primary care needs.    I spent  41  minutes in the care of the patient today including review of labs from CMP, Lipids, Thyroid Function, Hematology (current and previous including abstractions from other facilities); face-to-face time discussing  his blood glucose readings/logs, discussing hypoglycemia and hyperglycemia episodes and symptoms, medications doses, his options of short and long term treatment based on the latest standards of care / guidelines;  discussion about incorporating lifestyle medicine;  and documenting the encounter. Risk reduction counseling performed per USPSTF guidelines to reduce  obesity and  cardiovascular risk factors.     Please refer to Patient Instructions for Blood Glucose Monitoring and Insulin/Medications Dosing Guide"  in media tab for additional information. Please  also refer to " Patient Self Inventory" in the Media  tab for reviewed elements of pertinent patient history.  Joel Ward participated in the discussions, expressed understanding, and voiced agreement with the above plans.  All questions were answered to his satisfaction. he is encouraged to contact clinic should he have any questions or concerns prior to his return visit.   Follow up plan: - Return in about 4 months (around 04/12/2023) for F/U with Pre-visit Labs, Meter/CGM/Logs, A1c here.  Marquis Lunch, MD Phone: [phone]  Fax: [phone]   12/10/2022, 10:51 AM This note was partially dictated with voice recognition software. Similar sounding words can be transcribed inadequately or may not  be corrected upon review.

## 2022-12-10 NOTE — Patient Instructions (Signed)

## 2022-12-11 ENCOUNTER — Telehealth: Payer: Self-pay | Admitting: "Endocrinology

## 2022-12-11 NOTE — Telephone Encounter (Signed)
Tried to call pt to let him that the reason he keeps getting a bill from [place] is that he did not make a copay on the following dates::  03/27/22 office visit, 12/24/2017 office visit, 02/14/2019 phone visit, 07/18/2019 phone visit. He has a bad debit of $80.00  He also has other bad debit with another [employer] totaling $299.76 DOS, 2016, 2017 ane 2018  I got all this information from Kennon Holter with Billing Dept. He will have to call them to make this payment.

## 2023-01-01 DIAGNOSIS — M9902 Segmental and somatic dysfunction of thoracic region: Secondary | ICD-10-CM | POA: Diagnosis not present

## 2023-01-01 DIAGNOSIS — M9905 Segmental and somatic dysfunction of pelvic region: Secondary | ICD-10-CM | POA: Diagnosis not present

## 2023-01-01 DIAGNOSIS — M9903 Segmental and somatic dysfunction of lumbar region: Secondary | ICD-10-CM | POA: Diagnosis not present

## 2023-01-01 DIAGNOSIS — M9904 Segmental and somatic dysfunction of sacral region: Secondary | ICD-10-CM | POA: Diagnosis not present

## 2023-01-06 DIAGNOSIS — M9904 Segmental and somatic dysfunction of sacral region: Secondary | ICD-10-CM | POA: Diagnosis not present

## 2023-01-06 DIAGNOSIS — M9902 Segmental and somatic dysfunction of thoracic region: Secondary | ICD-10-CM | POA: Diagnosis not present

## 2023-01-06 DIAGNOSIS — M9903 Segmental and somatic dysfunction of lumbar region: Secondary | ICD-10-CM | POA: Diagnosis not present

## 2023-01-06 DIAGNOSIS — M9905 Segmental and somatic dysfunction of pelvic region: Secondary | ICD-10-CM | POA: Diagnosis not present

## 2023-01-09 ENCOUNTER — Emergency Department (HOSPITAL_COMMUNITY): Payer: BC Managed Care – PPO

## 2023-01-09 ENCOUNTER — Encounter (HOSPITAL_COMMUNITY): Payer: Self-pay | Admitting: Family Medicine

## 2023-01-09 ENCOUNTER — Observation Stay (HOSPITAL_COMMUNITY): Payer: BC Managed Care – PPO

## 2023-01-09 ENCOUNTER — Other Ambulatory Visit: Payer: Self-pay

## 2023-01-09 ENCOUNTER — Inpatient Hospital Stay (HOSPITAL_COMMUNITY)
Admission: EM | Admit: 2023-01-09 | Discharge: 2023-01-11 | DRG: 291 | Disposition: A | Discharge status: Home / Self Care | Payer: BC Managed Care – PPO | Attending: Family Medicine | Admitting: Family Medicine

## 2023-01-09 DIAGNOSIS — R0602 Shortness of breath: Secondary | ICD-10-CM

## 2023-01-09 DIAGNOSIS — J690 Pneumonitis due to inhalation of food and vomit: Secondary | ICD-10-CM | POA: Diagnosis present

## 2023-01-09 DIAGNOSIS — Z1152 Encounter for screening for COVID-19: Secondary | ICD-10-CM | POA: Diagnosis not present

## 2023-01-09 DIAGNOSIS — I5033 Acute on chronic diastolic (congestive) heart failure: Secondary | ICD-10-CM | POA: Diagnosis present

## 2023-01-09 DIAGNOSIS — Z955 Presence of coronary angioplasty implant and graft: Secondary | ICD-10-CM

## 2023-01-09 DIAGNOSIS — R9431 Abnormal electrocardiogram [ECG] [EKG]: Secondary | ICD-10-CM | POA: Diagnosis not present

## 2023-01-09 DIAGNOSIS — E782 Mixed hyperlipidemia: Secondary | ICD-10-CM | POA: Diagnosis present

## 2023-01-09 DIAGNOSIS — Z833 Family history of diabetes mellitus: Secondary | ICD-10-CM

## 2023-01-09 DIAGNOSIS — Z9102 Food additives allergy status: Secondary | ICD-10-CM

## 2023-01-09 DIAGNOSIS — Z8249 Family history of ischemic heart disease and other diseases of the circulatory system: Secondary | ICD-10-CM

## 2023-01-09 DIAGNOSIS — I5030 Unspecified diastolic (congestive) heart failure: Secondary | ICD-10-CM | POA: Diagnosis present

## 2023-01-09 DIAGNOSIS — Z7982 Long term (current) use of aspirin: Secondary | ICD-10-CM

## 2023-01-09 DIAGNOSIS — I2721 Secondary pulmonary arterial hypertension: Secondary | ICD-10-CM | POA: Diagnosis present

## 2023-01-09 DIAGNOSIS — I2510 Atherosclerotic heart disease of native coronary artery without angina pectoris: Secondary | ICD-10-CM | POA: Diagnosis present

## 2023-01-09 DIAGNOSIS — G4733 Obstructive sleep apnea (adult) (pediatric): Secondary | ICD-10-CM | POA: Diagnosis present

## 2023-01-09 DIAGNOSIS — I25110 Atherosclerotic heart disease of native coronary artery with unstable angina pectoris: Secondary | ICD-10-CM | POA: Diagnosis present

## 2023-01-09 DIAGNOSIS — Z23 Encounter for immunization: Secondary | ICD-10-CM

## 2023-01-09 DIAGNOSIS — R911 Solitary pulmonary nodule: Secondary | ICD-10-CM | POA: Diagnosis not present

## 2023-01-09 DIAGNOSIS — K449 Diaphragmatic hernia without obstruction or gangrene: Secondary | ICD-10-CM | POA: Diagnosis present

## 2023-01-09 DIAGNOSIS — Z6841 Body Mass Index (BMI) 40.0 and over, adult: Secondary | ICD-10-CM

## 2023-01-09 DIAGNOSIS — I700 Atherosclerosis of aorta: Secondary | ICD-10-CM | POA: Diagnosis not present

## 2023-01-09 DIAGNOSIS — R079 Chest pain, unspecified: Secondary | ICD-10-CM

## 2023-01-09 DIAGNOSIS — Z7984 Long term (current) use of oral hypoglycemic drugs: Secondary | ICD-10-CM | POA: Diagnosis not present

## 2023-01-09 DIAGNOSIS — Z91030 Bee allergy status: Secondary | ICD-10-CM

## 2023-01-09 DIAGNOSIS — Z794 Long term (current) use of insulin: Secondary | ICD-10-CM | POA: Diagnosis not present

## 2023-01-09 DIAGNOSIS — J40 Bronchitis, not specified as acute or chronic: Secondary | ICD-10-CM | POA: Diagnosis not present

## 2023-01-09 DIAGNOSIS — E876 Hypokalemia: Secondary | ICD-10-CM | POA: Diagnosis present

## 2023-01-09 DIAGNOSIS — R918 Other nonspecific abnormal finding of lung field: Secondary | ICD-10-CM | POA: Diagnosis present

## 2023-01-09 DIAGNOSIS — R0789 Other chest pain: Secondary | ICD-10-CM | POA: Diagnosis not present

## 2023-01-09 DIAGNOSIS — I517 Cardiomegaly: Secondary | ICD-10-CM | POA: Diagnosis not present

## 2023-01-09 DIAGNOSIS — J9601 Acute respiratory failure with hypoxia: Secondary | ICD-10-CM

## 2023-01-09 DIAGNOSIS — I509 Heart failure, unspecified: Secondary | ICD-10-CM

## 2023-01-09 DIAGNOSIS — I110 Hypertensive heart disease with heart failure: Principal | ICD-10-CM | POA: Diagnosis present

## 2023-01-09 DIAGNOSIS — I10 Essential (primary) hypertension: Secondary | ICD-10-CM | POA: Diagnosis present

## 2023-01-09 DIAGNOSIS — Z79899 Other long term (current) drug therapy: Secondary | ICD-10-CM

## 2023-01-09 DIAGNOSIS — K219 Gastro-esophageal reflux disease without esophagitis: Secondary | ICD-10-CM | POA: Diagnosis present

## 2023-01-09 DIAGNOSIS — E1151 Type 2 diabetes mellitus with diabetic peripheral angiopathy without gangrene: Secondary | ICD-10-CM | POA: Diagnosis not present

## 2023-01-09 DIAGNOSIS — Z91013 Allergy to seafood: Secondary | ICD-10-CM

## 2023-01-09 DIAGNOSIS — E1159 Type 2 diabetes mellitus with other circulatory complications: Secondary | ICD-10-CM

## 2023-01-09 HISTORY — DX: Unspecified diastolic (congestive) heart failure: I50.30

## 2023-01-09 HISTORY — DX: Obstructive sleep apnea (adult) (pediatric): G47.33

## 2023-01-09 HISTORY — DX: Acute respiratory failure with hypoxia: J96.01

## 2023-01-09 LAB — ECHOCARDIOGRAM COMPLETE
Area-P 1/2: 4.06 cm2
Calc EF: 60.7 %
Height: 71 in
S' Lateral: 3 cm
Single Plane A2C EF: 53.3 %
Single Plane A4C EF: 69 %
Weight: 4800 oz

## 2023-01-09 LAB — URINALYSIS, ROUTINE W REFLEX MICROSCOPIC
Bacteria, UA: NONE SEEN
Bilirubin Urine: NEGATIVE
Glucose, UA: >=500 mg/dL — AB
Hgb urine dipstick: NEGATIVE
Ketones, ur: 5 mg/dL — AB
Leukocytes,Ua: NEGATIVE
Nitrite: NEGATIVE
Protein, ur: NEGATIVE mg/dL
Specific Gravity, Urine: 1.013 (ref 1.005–1.030)
pH: 7 (ref 5.0–8.0)

## 2023-01-09 LAB — CBC WITH DIFFERENTIAL/PLATELET
Abs Immature Granulocytes: 0.04 10*3/uL (ref 0.00–0.07)
Basophils Absolute: 0 10*3/uL (ref 0.0–0.1)
Basophils Relative: 0 %
Eosinophils Absolute: 0.1 10*3/uL (ref 0.0–0.5)
Eosinophils Relative: 1 %
HCT: 38.9 % — ABNORMAL LOW (ref 39.0–52.0)
Hemoglobin: 12.3 g/dL — ABNORMAL LOW (ref 13.0–17.0)
Immature Granulocytes: 0 %
Lymphocytes Relative: 13 %
Lymphs Abs: 1.7 10*3/uL (ref 0.7–4.0)
MCH: 27.7 pg (ref 26.0–34.0)
MCHC: 31.6 g/dL (ref 30.0–36.0)
MCV: 87.6 fL (ref 80.0–100.0)
Monocytes Absolute: 1.2 10*3/uL — ABNORMAL HIGH (ref 0.1–1.0)
Monocytes Relative: 10 %
Neutro Abs: 9.5 10*3/uL — ABNORMAL HIGH (ref 1.7–7.7)
Neutrophils Relative %: 76 %
Platelets: 294 10*3/uL (ref 150–400)
RBC: 4.44 MIL/uL (ref 4.22–5.81)
RDW: 15.9 % — ABNORMAL HIGH (ref 11.5–15.5)
WBC: 12.5 10*3/uL — ABNORMAL HIGH (ref 4.0–10.5)
nRBC: 0 % (ref 0.0–0.2)

## 2023-01-09 LAB — GLUCOSE, CAPILLARY
Glucose-Capillary: 99 mg/dL (ref 70–99)
Glucose-Capillary: 128 mg/dL — ABNORMAL HIGH (ref 70–99)
Glucose-Capillary: 124 mg/dL — ABNORMAL HIGH (ref 70–99)

## 2023-01-09 LAB — BRAIN NATRIURETIC PEPTIDE: B Natriuretic Peptide: 11 pg/mL (ref 0.0–100.0)

## 2023-01-09 LAB — BASIC METABOLIC PANEL
Anion gap: 9 (ref 5–15)
BUN: 12 mg/dL (ref 6–20)
CO2: 26 mmol/L (ref 22–32)
Calcium: 8.8 mg/dL — ABNORMAL LOW (ref 8.9–10.3)
Chloride: 103 mmol/L (ref 98–111)
Creatinine, Ser: 0.99 mg/dL (ref 0.61–1.24)
GFR, Estimated: >60 mL/min (ref >60)
Glucose, Bld: 115 mg/dL — ABNORMAL HIGH (ref 70–99)
Potassium: 3.4 mmol/L — ABNORMAL LOW (ref 3.5–5.1)
Sodium: 138 mmol/L (ref 135–145)

## 2023-01-09 LAB — SARS CORONAVIRUS 2 BY RT PCR: SARS Coronavirus 2 by RT PCR: NEGATIVE

## 2023-01-09 LAB — TROPONIN I (HIGH SENSITIVITY)
Troponin I (High Sensitivity): 8 ng/L (ref <18)
Troponin I (High Sensitivity): 8 ng/L (ref <18)

## 2023-01-09 LAB — D-DIMER, QUANTITATIVE: D-Dimer, Quant: 1.62 ug/mL-FEU — ABNORMAL HIGH (ref 0.00–0.50)

## 2023-01-09 MED ORDER — INSULIN ASPART 100 UNIT/ML IJ SOLN: 0.0000 [IU] | Freq: Every day | INTRAMUSCULAR | Status: DC

## 2023-01-09 MED ORDER — INSULIN ASPART 100 UNIT/ML IJ SOLN
0.0000 [IU] | Freq: Three times a day (TID) | INTRAMUSCULAR | Status: DC
  Administered 2023-01-09: 3 [IU] via SUBCUTANEOUS
  Administered 2023-01-10: 4 [IU] via SUBCUTANEOUS
  Administered 2023-01-10 (×2): 3 [IU] via SUBCUTANEOUS
  Administered 2023-01-11: 4 [IU] via SUBCUTANEOUS

## 2023-01-09 MED ORDER — LABETALOL HCL 5 MG/ML IV SOLN: 10.0000 mg | INTRAVENOUS | Status: DC | PRN

## 2023-01-09 MED ORDER — ASPIRIN 81 MG PO CHEW
81.0000 mg | CHEWABLE_TABLET | Freq: Every day | ORAL | Status: DC
  Administered 2023-01-09 – 2023-01-11 (×3): 81 mg via ORAL
  Filled 2023-01-09 (×3): qty 1

## 2023-01-09 MED ORDER — LISINOPRIL 10 MG PO TABS
20.0000 mg | ORAL_TABLET | Freq: Every day | ORAL | Status: DC
  Administered 2023-01-09 – 2023-01-11 (×3): 20 mg via ORAL
  Filled 2023-01-09 (×3): qty 2

## 2023-01-09 MED ORDER — SODIUM CHLORIDE 0.9% FLUSH
3.0000 mL | Freq: Two times a day (BID) | INTRAVENOUS | Status: DC
  Administered 2023-01-10 (×2): 3 mL via INTRAVENOUS

## 2023-01-09 MED ORDER — SODIUM CHLORIDE 0.9% FLUSH
3.0000 mL | Freq: Two times a day (BID) | INTRAVENOUS | Status: DC
  Administered 2023-01-09 – 2023-01-11 (×3): 3 mL via INTRAVENOUS

## 2023-01-09 MED ORDER — DM-GUAIFENESIN ER 30-600 MG PO TB12
1.0000 | ORAL_TABLET | Freq: Two times a day (BID) | ORAL | Status: DC
  Administered 2023-01-09 – 2023-01-11 (×4): 1 via ORAL
  Filled 2023-01-09 (×4): qty 1

## 2023-01-09 MED ORDER — POLYETHYLENE GLYCOL 3350 17 G PO PACK: 17.0000 g | PACK | Freq: Every day | ORAL | Status: DC | PRN

## 2023-01-09 MED ORDER — ONDANSETRON HCL 4 MG/2ML IJ SOLN: 4.0000 mg | Freq: Four times a day (QID) | INTRAMUSCULAR | Status: DC | PRN

## 2023-01-09 MED ORDER — SODIUM CHLORIDE 0.9% FLUSH: 3.0000 mL | INTRAVENOUS | Status: DC | PRN

## 2023-01-09 MED ORDER — FUROSEMIDE 10 MG/ML IJ SOLN
20.0000 mg | Freq: Every day | INTRAMUSCULAR | Status: DC
  Administered 2023-01-10 – 2023-01-11 (×2): 20 mg via INTRAVENOUS
  Filled 2023-01-09 (×2): qty 2

## 2023-01-09 MED ORDER — FUROSEMIDE 10 MG/ML IJ SOLN
40.0000 mg | Freq: Two times a day (BID) | INTRAMUSCULAR | Status: DC
  Administered 2023-01-09: 40 mg via INTRAVENOUS
  Filled 2023-01-09: qty 4

## 2023-01-09 MED ORDER — ALBUTEROL SULFATE (2.5 MG/3ML) 0.083% IN NEBU: 2.5000 mg | INHALATION_SOLUTION | RESPIRATORY_TRACT | Status: DC | PRN

## 2023-01-09 MED ORDER — ACETAMINOPHEN 650 MG RE SUPP: 650.0000 mg | Freq: Four times a day (QID) | RECTAL | Status: DC | PRN

## 2023-01-09 MED ORDER — AMOXICILLIN-POT CLAVULANATE 875-125 MG PO TABS
1.0000 | ORAL_TABLET | Freq: Two times a day (BID) | ORAL | Status: DC
  Administered 2023-01-09 – 2023-01-11 (×4): 1 via ORAL
  Filled 2023-01-09 (×4): qty 1

## 2023-01-09 MED ORDER — INSULIN ASPART 100 UNIT/ML IJ SOLN
3.0000 [IU] | Freq: Three times a day (TID) | INTRAMUSCULAR | Status: DC
  Administered 2023-01-09 – 2023-01-11 (×6): 3 [IU] via SUBCUTANEOUS

## 2023-01-09 MED ORDER — SODIUM CHLORIDE 0.9 % IV SOLN: INTRAVENOUS | Status: DC | PRN

## 2023-01-09 MED ORDER — POTASSIUM CHLORIDE CRYS ER 20 MEQ PO TBCR
40.0000 meq | EXTENDED_RELEASE_TABLET | ORAL | Status: AC
  Administered 2023-01-09 – 2023-01-10 (×2): 40 meq via ORAL
  Filled 2023-01-09 (×2): qty 2

## 2023-01-09 MED ORDER — IOHEXOL 350 MG/ML SOLN
75.0000 mL | Freq: Once | INTRAVENOUS | Status: AC | PRN
  Administered 2023-01-09: 75 mL via INTRAVENOUS

## 2023-01-09 MED ORDER — HEPARIN SODIUM (PORCINE) 5000 UNIT/ML IJ SOLN
5000.0000 [IU] | Freq: Three times a day (TID) | INTRAMUSCULAR | Status: DC
  Administered 2023-01-09 – 2023-01-11 (×5): 5000 [IU] via SUBCUTANEOUS
  Filled 2023-01-09 (×5): qty 1

## 2023-01-09 MED ORDER — ONDANSETRON HCL 4 MG PO TABS: 4.0000 mg | ORAL_TABLET | Freq: Four times a day (QID) | ORAL | Status: DC | PRN

## 2023-01-09 MED ORDER — BISACODYL 10 MG RE SUPP: 10.0000 mg | Freq: Every day | RECTAL | Status: DC | PRN

## 2023-01-09 MED ORDER — MELATONIN 5 MG PO TABS
5.0000 mg | ORAL_TABLET | Freq: Every day | ORAL | Status: DC
  Filled 2023-01-09 (×2): qty 1

## 2023-01-09 MED ORDER — MELATONIN 3 MG PO TABS
3.0000 mg | ORAL_TABLET | Freq: Every day | ORAL | Status: DC
  Administered 2023-01-09 – 2023-01-10 (×2): 3 mg via ORAL
  Filled 2023-01-09 (×2): qty 1

## 2023-01-09 MED ORDER — PERFLUTREN LIPID MICROSPHERE
1.0000 mL | INTRAVENOUS | Status: AC | PRN
  Administered 2023-01-09: 2 mL via INTRAVENOUS

## 2023-01-09 MED ORDER — INSULIN GLARGINE-YFGN 100 UNIT/ML ~~LOC~~ SOLN
60.0000 [IU] | Freq: Every day | SUBCUTANEOUS | Status: DC
  Administered 2023-01-09 – 2023-01-10 (×2): 60 [IU] via SUBCUTANEOUS
  Filled 2023-01-09 (×3): qty 0.6

## 2023-01-09 MED ORDER — ACETAMINOPHEN 325 MG PO TABS: 650.0000 mg | ORAL_TABLET | Freq: Four times a day (QID) | ORAL | Status: DC | PRN

## 2023-01-09 MED ORDER — ALBUTEROL SULFATE HFA 108 (90 BASE) MCG/ACT IN AERS: 2.0000 | INHALATION_SPRAY | RESPIRATORY_TRACT | Status: DC | PRN

## 2023-01-09 MED ORDER — AMLODIPINE BESYLATE 5 MG PO TABS
5.0000 mg | ORAL_TABLET | Freq: Every day | ORAL | Status: DC
  Administered 2023-01-09 – 2023-01-11 (×3): 5 mg via ORAL
  Filled 2023-01-09 (×3): qty 1

## 2023-01-09 MED ORDER — ATORVASTATIN CALCIUM 40 MG PO TABS
40.0000 mg | ORAL_TABLET | Freq: Every day | ORAL | Status: DC
  Administered 2023-01-09 – 2023-01-10 (×2): 40 mg via ORAL
  Filled 2023-01-09 (×2): qty 1
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
## 2023-01-09 NOTE — Progress Notes (Signed)
   01/09/23 1615  TOC Brief Assessment  Patient has primary care physician Yes  Home environment has been reviewed From Home  Prior level of function: Independent  Prior/Current Home Services No current home services  Social Determinants of Health Reivew SDOH reviewed no interventions necessary  Readmission risk has been reviewed Yes (No score)  Transition of care needs no transition of care needs at this time

## 2023-01-09 NOTE — ED Notes (Signed)
Offered patient snack due to blood sugar dropping and he stated he did not want any crackers or juice provided him with of water.

## 2023-01-09 NOTE — Progress Notes (Signed)
Went in to ask patient about CPAP usage.  Patient does not use a CPAP at home and has refused here as well.  Patient is using 2L [place] at this time.

## 2023-01-09 NOTE — Progress Notes (Signed)
  Echocardiogram 2D Echocardiogram has been performed.  Janalyn Harder 01/09/2023, 2:16 PM

## 2023-01-09 NOTE — ED Notes (Signed)
Patient ambulated to nurse desk and back. Without oxygen from 87-89%. Patient states it is still hard for him to breathe but he felt ok while walking. Placed back on Oxygen now 94% back in bed.

## 2023-01-09 NOTE — H&P (Signed)
Patient Demographics:    Joel Ward, is a 57 y.o. male  MRN: [id number]   DOB - September 25, 1965  Admit Date - 01/09/2023  Outpatient Primary MD for the patient is [facility], [facility]   Assessment & Plan:   Assessment and Plan: 1) left-sided chest discomfort--history of CAD with prior stent (03/16/2017) -Troponin is 8, repeat troponin is 8, EKG sinus tachycardia without acute finding -Echo on 01/09/2023 shows  ejection fraction, by estimation, is 60 to 65% . The left ventricle has normal function. The left ventricle has no regional wall motion abnormalities. There is severe concentric left ventricular hypertrophy. Left ventricular diastolic parameters are consistent with Grade I diastolic dysfunction ( impaired relaxation)  -No mitral stenosis or aortic stenosis -Continue aspirin and atorvastatin for CAD  2) bronchitis possible aspiration pneumonia---CTA chest findings noted -COVID-negative - leukocytosis noted -Recently treated with Z-Pak -Give Augmentin and mucolytics  3)HFpEF--- acute diastolic dysfunction CHF -Chest x-ray consistent with CHF -BNP is not elevated but patient is morbidly obese -CTA chest with concerns for possible pulmonary artery hypertension -Gentle diuresis with IV Lasix -Monitor fluid intake and output balance  4) pulmonary nodules--- patient is a non-smoker -Repeat CT chest in 1 year  5) hypokalemia--- replace and monitor especially while on diuresis as above #3  6)DM2-A1c 7.1 -Use Novolog/Humalog Sliding scale insulin with Accu-Cheks/Fingersticks as ordered   7)Morbid Obesity/OSA -Low calorie diet, portion control and increase physical activity discussed with patient -Body mass index is 42.82 kg/m. --- CPAP advised qhs  Status is: Inpatient  Remains inpatient  appropriate because:   Dispo: The patient is from: Home              Anticipated d/c is to: Home              Anticipated d/c date is: 2 days              Patient currently is not medically stable to d/c. Barriers: Not Clinically Stable-   With History of - Reviewed by me  Past Medical History:  Diagnosis Date   CAD (coronary artery disease), native coronary artery    10/18 PCI/DES mLAD, normal EF on LV gram   Diabetes mellitus    Type 2 diagnosed 2 weeks ago   Hiatal hernia with gastroesophageal reflux    Hypertension    Mixed hyperlipidemia    Morbid obesity (HCC)    OSA on CPAP    Urinary frequency       Past Surgical History:  Procedure Laterality Date   CIRCUMCISION     1988   CORONARY STENT INTERVENTION N/A 03/16/2017   Procedure: CORONARY STENT INTERVENTION;  Surgeon: Lyn Records, MD;  Location: [facility] INVASIVE CV LAB;  Service: Cardiovascular;  Laterality: N/A;   ESOPHAGEAL DILATION N/A 02/08/2015   Procedure: ESOPHAGEAL DILATION;  Surgeon: Malissa Hippo, MD;  Location: [facility] ENDO SUITE;  Service: Endoscopy;  Laterality: N/A;   ESOPHAGOGASTRODUODENOSCOPY N/A  02/08/2015   Procedure: ESOPHAGOGASTRODUODENOSCOPY (EGD);  Surgeon: Malissa Hippo, MD;  Location: [facility] ENDO [facility];  Service: Endoscopy;  Laterality: N/A;  1055   ESOPHAGOGASTRODUODENOSCOPY (EGD) WITH ESOPHAGEAL DILATION  03/29/2012   Procedure: ESOPHAGOGASTRODUODENOSCOPY (EGD) WITH ESOPHAGEAL DILATION;  Surgeon: Malissa Hippo, MD;  Location: [facility];  Service: Endoscopy;  Laterality: N/A;  730   LEFT HEART CATH AND CORONARY ANGIOGRAPHY N/A 03/16/2017   Procedure: LEFT HEART CATH AND CORONARY ANGIOGRAPHY;  Surgeon: Lyn Records, MD;  Location: [facility] INVASIVE CV LAB;  Service: Cardiovascular;  Laterality: N/A;   ULTRASOUND GUIDANCE FOR VASCULAR ACCESS  03/16/2017   Procedure: Ultrasound Guidance For Vascular Access;  Surgeon: Lyn Records, MD;  Location: [facility] INVASIVE CV LAB;  Service: Cardiovascular;;    Chief  Complaint  Patient presents with   Shortness of Breath   Flank Pain      HPI:    Joel Ward  is a 57 y.o. male non-smoker with past medical history relevant for CAD with prior stent, OSA noncompliant with CPAP, morbid obesity, HTN and DM presents to the ED with complaints of left-sided chest discomfort and dyspnea since 01/08/2023 -Recently treated for bronchitis/URI with Z-Pak -Additional history obtained from wife at bedside- No fever  Or chills  -No leg pains or leg swelling -Chest discomfort is somewhat pleuritic, chest discomfort is worse with cough, but cough is not productive no recent falls or chest injury  No Nausea, Vomiting or Diarrhea -No urinary symptoms -Chest x-ray suggest mild CHF -CTA chest without PE, findings of possible aspiration pneumonia, and pulmonary artery hypertension -BNP 11--patient is morbidly obese -COVID-negative -Troponin is 8, repeat troponin is 8, EKG sinus tachycardia without acute findings -WBC 12.5-hemoglobin 12.3 -Potassium 3.4, creatinine 0.99   Review of systems:    In addition to the HPI above,   A full Review of  Systems was done, all other systems reviewed are negative except as noted above in HPI , .    Social History:  Reviewed by me    Social History   Tobacco Use   Smoking status: Never   Smokeless tobacco: Never  Substance Use Topics   Alcohol use: No     Family History :  Reviewed by me    Family History  Problem Relation Age of Onset   Heart disease Mother    Kidney failure Mother    Diabetes Mother    Diabetes Father    Thyroid disease Neg Hx     Home Medications:   Prior to Admission medications   Medication Sig Start Date End Date Taking? Authorizing Provider  atorvastatin (LIPITOR) 40 MG tablet TAKE 1 TABLET BY MOUTH ONCE DAILY AT  6  PM Patient taking differently: Take 40 mg by mouth daily. 09/30/18  Yes Wendall Stade, MD  dapagliflozin propanediol (FARXIGA) 10 MG TABS tablet Take 1 tablet (10 mg  total) by mouth daily before breakfast. 09/02/22  Yes Nida, Denman George, MD  insulin glargine, 1 Unit Dial, (TOUJEO SOLOSTAR) 300 UNIT/ML Solostar Pen Inject 80 Units into the skin at bedtime. 12/10/22  Yes Nida, Denman George, MD  lisinopril (ZESTRIL) 20 MG tablet Take 1 tablet by mouth once daily Patient taking differently: Take 20 mg by mouth daily. 11/17/22  Yes Nida, Denman George, MD  metFORMIN (GLUCOPHAGE-XR) 500 MG 24 hr tablet TAKE 2 TABLETS BY MOUTH ONCE DAILY WITH BREAKFAST Patient taking differently: Take 500 mg by mouth 2 (two) times daily with a meal. 06/16/22  Yes  Roma Kayser, MD  sildenafil (VIAGRA) 100 MG tablet Take 100 mg by mouth as needed. 03/20/17  Yes [provider]  aspirin 81 MG chewable tablet Chew 1 tablet (81 mg total) by mouth daily. 03/17/17   Arty Baumgartner, NP  Cholecalciferol (VITAMIN D3) 125 MCG (5000 UT) CAPS Take 1 capsule (5,000 Units total) by mouth daily. 10/16/19   Roma Kayser, MD  Continuous Blood Gluc Receiver (FREESTYLE LIBRE 14 DAY READER) DEVI Use to check blood glucose 4 times daily. 06/06/21   Roma Kayser, MD  Continuous Glucose Sensor (FREESTYLE LIBRE 3 SENSOR) MISC USE TO CHECK GLUCOSE CONTINUOUSLY. CHANGE SENSOR EVERY 14 DAYS 11/23/22   Roma Kayser, MD  cyclobenzaprine (FLEXERIL) 10 MG tablet Take 1 tablet (10 mg total) by mouth 2 (two) times daily as needed for muscle spasms. 01/20/20   Gwyneth Sprout, MD  ibuprofen (ADVIL) 800 MG tablet Take 800 mg by mouth 3 (three) times daily. 01/21/20   [provider]  Insulin Pen Needle (B-D ULTRAFINE III SHORT PEN) 31G X 8 MM MISC USE 1 PEN NEEDLE DAILY AS DIRECTED 09/17/20   Roma Kayser, MD  Melatonin 2.5 MG CAPS Take 2.5 mg by mouth at bedtime as needed (sleep).    [provider]  nitroGLYCERIN (NITROSTAT) 0.4 MG SL tablet Place 1 tablet (0.4 mg total) under the tongue every 5 (five) minutes as needed. 03/16/17   Arty Baumgartner, NP  Vitamin D, Ergocalciferol, (DRISDOL) 1.25 MG (50000 UNIT) CAPS capsule Take 1 capsule by mouth once a week 09/23/22   Roma Kayser, MD     Allergies:     Allergies  Allergen Reactions   Bee Venom    Shellfish Allergy Swelling    Turns red   Strawberry Extract Rash     Physical Exam:   Vitals  Blood pressure (!) 143/93, pulse 93, temperature 98 F (36.7 C), temperature source Oral, resp. rate 20, height 5\' 11"  (1.803 m), weight (!) 139.3 kg, SpO2 96%.  Physical Examination: General appearance - alert,  in no distress  Mental status - alert, oriented to person, place, and time,  Nose- [place] 2L/min Eyes - sclera anicteric Neck - supple, no JVD elevation , Chest -diminished breath sounds with faint bibasilar rales/rhonchi Heart - S1 and S2 normal, regular  Abdomen - soft, nontender, nondistended, +BS, increased truncal adiposity Neurological - screening mental status exam normal, neck supple without rigidity, cranial nerves II through XII intact, DTR's normal and symmetric Extremities -+ve  pedal edema noted, intact peripheral pulses  Skin - warm, dry     Data Review:    CBC Recent Labs  Lab 01/09/23 0840  WBC 12.5*  HGB 12.3*  HCT 38.9*  PLT 294  MCV 87.6  MCH 27.7  MCHC 31.6  RDW 15.9*  LYMPHSABS 1.7  MONOABS 1.2*  EOSABS 0.1  BASOSABS 0.0   ------------------------------------------------------------------------------------------------------------------  Chemistries  Recent Labs  Lab 01/09/23 0840  NA 138  K 3.4*  CL 103  CO2 26  GLUCOSE 115*  BUN 12  CREATININE 0.99  CALCIUM 8.8*   ------------------------------------------------------------------------------------------------------------------ estimated creatinine clearance is 120.3 mL/min (by C-G formula based on SCr of 0.99  mg/dL). ------------------------------------------------------------------------------------------------------------------ ------------------------------------------------------------------------------------------------------------------ Recent Labs    01/09/23 0840  DDIMER 1.62*   ------------------------------------------------------------------------------------------------------------------    Component Value Date/Time   BNP 11.0 01/09/2023 0840   Urinalysis    Component Value Date/Time   COLORURINE STRAW (A) 01/09/2023 0828   APPEARANCEUR CLEAR 01/09/2023  0828   LABSPEC 1.013 01/09/2023 0828   PHURINE 7.0 01/09/2023 0828   GLUCOSEU >=500 (A) 01/09/2023 0828   HGBUR NEGATIVE 01/09/2023 0828   BILIRUBINUR NEGATIVE 01/09/2023 0828   KETONESUR 5 (A) 01/09/2023 0828   PROTEINUR NEGATIVE 01/09/2023 0828   NITRITE NEGATIVE 01/09/2023 0828   LEUKOCYTESUR NEGATIVE 01/09/2023 0828    ----------------------------------------------------------------------------------------------------------------   Imaging Results:    ECHOCARDIOGRAM COMPLETE  Result Date: 01/09/2023    ECHOCARDIOGRAM REPORT   Patient Name:   IRISH GONYO Date of Exam: 01/09/2023 Medical Rec #:  [id number]         Height:       71.0 in Accession #:    [id number]        Weight:       300.0 lb Date of Birth:  [birth year]         BSA:          2.505 m Patient Age:    57 years          BP:           167/95 mmHg Patient Gender: M                 HR:           86 bpm. [facility] Procedure: 2D Echo, Cardiac Doppler, Color Doppler and Intracardiac            Opacification Agent Indications:    R94.31 Abnormal EKG  History:        Patient has no prior history of Echocardiogram examinations.                 Signs/Symptoms:Shortness of Breath, Dyspnea and Chest Pain; Risk                 Factors:Diabetes, Dyslipidemia and Hypertension.  Sonographer:    Sheralyn Boatman RDCS Referring Phys: [phone] [facility]   Sonographer Comments: Technically difficult study due to poor echo windows, suboptimal parasternal window, suboptimal apical window, suboptimal subcostal window and patient is obese. Image acquisition challenging due to patient body habitus. IMPRESSIONS  1. Left ventricular ejection fraction, by estimation, is 60 to 65%. The left ventricle has normal function. The left ventricle has no regional wall motion abnormalities. There is severe concentric left ventricular hypertrophy. Left ventricular diastolic  parameters are consistent with Grade I diastolic dysfunction (impaired relaxation).  2. Right ventricular systolic function is normal. The right ventricular size is mildly enlarged.  3. The mitral valve is normal in structure. No evidence of mitral valve regurgitation. No evidence of mitral stenosis.  4. The aortic valve was not well visualized. Aortic valve regurgitation is not visualized. No aortic stenosis is present.  5. Aortic dilatation noted. There is dilatation of the aortic root. Comparison(s): No prior Echocardiogram. FINDINGS  Left Ventricle: Left ventricular ejection fraction, by estimation, is 60 to 65%. The left ventricle has normal function. The left ventricle has no regional wall motion abnormalities. Definity contrast agent was given IV to delineate the left ventricular  endocardial borders. The left ventricular internal cavity size was normal in size. There is severe concentric left ventricular hypertrophy. Left ventricular diastolic parameters are consistent with Grade I diastolic dysfunction (impaired relaxation). Right Ventricle: The right ventricular size is mildly enlarged. Right vetricular wall thickness was not well visualized. Right ventricular systolic function is normal. Left Atrium: Left atrial size was normal in size. Right Atrium: Right atrial size was normal in size. Pericardium: Trivial pericardial  effusion is present. The pericardial effusion is anterior to the right ventricle.  Presence of epicardial fat layer. Mitral Valve: The mitral valve is normal in structure. No evidence of mitral valve regurgitation. No evidence of mitral valve stenosis. Tricuspid Valve: The tricuspid valve is not well visualized. Tricuspid valve regurgitation is not demonstrated. No evidence of tricuspid stenosis. Aortic Valve: The aortic valve was not well visualized. Aortic valve regurgitation is not visualized. No aortic stenosis is present. Pulmonic Valve: The pulmonic valve was normal in structure. Pulmonic valve regurgitation is not visualized. No evidence of pulmonic stenosis. Aorta: The aortic root and ascending aorta are structurally normal, with no evidence of dilitation and aortic dilatation noted. There is dilatation of the aortic root. IAS/Shunts: No atrial level shunt detected by color flow Doppler.  LEFT VENTRICLE PLAX 2D LVIDd:         4.70 cm     Diastology LVIDs:         3.00 cm     LV e' medial:    4.46 cm/s LV PW:         1.60 cm     LV E/e' medial:  18.8 LV IVS:        1.70 cm     LV e' lateral:   7.62 cm/s LVOT diam:     2.40 cm     LV E/e' lateral: 11.0 LV SV:         80 LV SV Index:   32 LVOT Area:     4.52 cm  LV Volumes (MOD) LV vol d, MOD A2C: 83.9 ml LV vol d, MOD A4C: 81.7 ml LV vol s, MOD A2C: 39.2 ml LV vol s, MOD A4C: 25.3 ml LV SV MOD A2C:     44.8 ml LV SV MOD A4C:     81.7 ml LV SV MOD BP:      50.8 ml RIGHT VENTRICLE             IVC RV S prime:     16.50 cm/s  IVC diam: 1.50 cm TAPSE (M-mode): 2.4 cm LEFT ATRIUM             Index        RIGHT ATRIUM           Index LA diam:        3.00 cm 1.20 cm/m   RA Area:     17.10 cm LA Vol (A2C):   36.8 ml 14.69 ml/m  RA Volume:   47.20 ml  18.84 ml/m LA Vol (A4C):   55.9 ml 22.31 ml/m LA Biplane Vol: 46.6 ml 18.60 ml/m  AORTIC VALVE LVOT Vmax:   109.00 cm/s LVOT Vmean:  70.000 cm/s LVOT VTI:    0.176 m  AORTA Ao Root diam: 3.60 cm Ao Asc diam:  3.70 cm MITRAL VALVE MV Area (PHT): 4.06 cm    SHUNTS MV Decel Time: 187 msec     Systemic VTI:  0.18 m MV E velocity: 83.70 cm/s  Systemic Diam: 2.40 cm MV A velocity: 85.90 cm/s MV E/A ratio:  0.97 Riley Lam MD Electronically signed by Riley Lam MD Signature Date/Time: 01/09/2023/2:22:36 PM    Final    CT Angio Chest PE W/Cm &/Or Wo Cm  Result Date: 01/09/2023 CLINICAL DATA:  57 year old male with history of shortness of breath. EXAM: CT ANGIOGRAPHY CHEST WITH CONTRAST TECHNIQUE: Multidetector CT imaging of the chest was performed using the standard protocol during bolus administration of intravenous contrast. Multiplanar  CT image reconstructions and MIPs were obtained to evaluate the vascular anatomy. RADIATION DOSE REDUCTION: This exam was performed according to the departmental dose-optimization program which includes automated exposure control, adjustment of the mA and/or kV according to patient size and/or use of iterative reconstruction technique. CONTRAST:  75mL OMNIPAQUE IOHEXOL 350 MG/ML SOLN COMPARISON:  No priors. FINDINGS: Cardiovascular: No filling defect in the pulmonary arterial tree to suggest pulmonary embolism. Heart size is normal. There is no significant pericardial fluid, thickening or pericardial calcification. There is aortic atherosclerosis, as well as atherosclerosis of the great vessels of the mediastinum and the coronary arteries, including calcified atherosclerotic plaque in the left anterior descending and right coronary arteries. Dilatation of the pulmonic trunk (3.6 cm in diameter), concerning for pulmonary arterial hypertension. Mediastinum/Nodes: No pathologically enlarged mediastinal or hilar lymph nodes. Esophagus is unremarkable in appearance. No axillary lymphadenopathy. Lungs/Pleura: Thickening of the peribronchovascular interstitium throughout the lung bases with surrounding peribronchovascular ground-glass attenuation. These findings are nonspecific, and could reflect areas of chronic post infectious or inflammatory scarring,  although sequela of mild aspiration is not excluded. No confluent consolidative airspace disease. No pleural effusions. A few scattered small pulmonary nodules are noted, largest of which measure up to 5 mm in the periphery of the right upper lobe (axial image 60 of series 8). Upper Abdomen: Unremarkable. Musculoskeletal: There are no aggressive appearing lytic or blastic lesions noted in the visualized portions of the skeleton. Review of the MIP images confirms the above findings. IMPRESSION: 1. No evidence of pulmonary embolism. 2. Findings in the lung bases which may simply reflect areas of chronic post infectious or inflammatory scarring, although clinical correlation for signs and symptoms of recent aspiration is also suggested. 3. Dilatation of the pulmonic trunk (3.6 cm in diameter), concerning for pulmonary arterial hypertension. 4. Small pulmonary nodules measuring 5 mm or less in size, nonspecific, but statistically likely benign. No follow-up needed if patient is low-risk (and has no known or suspected primary neoplasm). Non-contrast chest CT can be considered in 12 months if patient is high-risk. This recommendation follows the consensus statement: Guidelines for Management of Incidental Pulmonary Nodules Detected on CT Images: From the Fleischner Society 2017; Radiology 2017; 284:228-243. 5. Aortic atherosclerosis, in addition to two-vessel coronary artery disease. Please note that although the presence of coronary artery calcium documents the presence of coronary artery disease, the severity of this disease and any potential stenosis cannot be assessed on this non-gated CT examination. Assessment for potential risk factor modification, dietary therapy or pharmacologic therapy may be warranted, if clinically indicated. Aortic Atherosclerosis (ICD10-I70.0). Electronically Signed   By: Trudie Reed M.D.   On: 01/09/2023 10:30   DG Chest 2 View  Result Date: 01/09/2023 CLINICAL DATA:  57 year old  male with history of shortness of breath. EXAM: CHEST - 2 VIEW COMPARISON:  Chest x-ray 01/20/2020. FINDINGS: There is cephalization of the pulmonary vasculature and slight indistinctness of the interstitial markings suggestive of mild pulmonary edema. Trace bilateral pleural effusions. Mild cardiomegaly. The patient is rotated to the right on today's exam, resulting in distortion of the mediastinal contours and reduced diagnostic sensitivity and specificity for mediastinal pathology. IMPRESSION: 1. The appearance of the chest suggests mild congestive heart failure, as above. Electronically Signed   By: Trudie Reed M.D.   On: 01/09/2023 09:12    Radiological Exams on Admission: ECHOCARDIOGRAM COMPLETE  Result Date: 01/09/2023    ECHOCARDIOGRAM REPORT   Patient Name:   ROGER ZARR Date of Exam: 01/09/2023 Medical  Rec #:  [id number]         Height:       71.0 in Accession #:    [id number]        Weight:       300.0 lb Date of Birth:  1965/12/12         BSA:          2.505 m Patient Age:    57 years          BP:           167/95 mmHg Patient Gender: M                 HR:           86 bpm. [facility] Procedure: 2D Echo, Cardiac Doppler, Color Doppler and Intracardiac            Opacification Agent Indications:    R94.31 Abnormal EKG  History:        Patient has no prior history of Echocardiogram examinations.                 Signs/Symptoms:Shortness of Breath, Dyspnea and Chest Pain; Risk                 Factors:Diabetes, Dyslipidemia and Hypertension.  Sonographer:    Sheralyn Boatman RDCS Referring Phys: [phone] [facility]  Sonographer Comments: Technically difficult study due to poor echo windows, suboptimal parasternal window, suboptimal apical window, suboptimal subcostal window and patient is obese. Image acquisition challenging due to patient body habitus. IMPRESSIONS  1. Left ventricular ejection fraction, by estimation, is 60 to 65%. The left ventricle has normal function. The left  ventricle has no regional wall motion abnormalities. There is severe concentric left ventricular hypertrophy. Left ventricular diastolic  parameters are consistent with Grade I diastolic dysfunction (impaired relaxation).  2. Right ventricular systolic function is normal. The right ventricular size is mildly enlarged.  3. The mitral valve is normal in structure. No evidence of mitral valve regurgitation. No evidence of mitral stenosis.  4. The aortic valve was not well visualized. Aortic valve regurgitation is not visualized. No aortic stenosis is present.  5. Aortic dilatation noted. There is dilatation of the aortic root. Comparison(s): No prior Echocardiogram. FINDINGS  Left Ventricle: Left ventricular ejection fraction, by estimation, is 60 to 65%. The left ventricle has normal function. The left ventricle has no regional wall motion abnormalities. Definity contrast agent was given IV to delineate the left ventricular  endocardial borders. The left ventricular internal cavity size was normal in size. There is severe concentric left ventricular hypertrophy. Left ventricular diastolic parameters are consistent with Grade I diastolic dysfunction (impaired relaxation). Right Ventricle: The right ventricular size is mildly enlarged. Right vetricular wall thickness was not well visualized. Right ventricular systolic function is normal. Left Atrium: Left atrial size was normal in size. Right Atrium: Right atrial size was normal in size. Pericardium: Trivial pericardial effusion is present. The pericardial effusion is anterior to the right ventricle. Presence of epicardial fat layer. Mitral Valve: The mitral valve is normal in structure. No evidence of mitral valve regurgitation. No evidence of mitral valve stenosis. Tricuspid Valve: The tricuspid valve is not well visualized. Tricuspid valve regurgitation is not demonstrated. No evidence of tricuspid stenosis. Aortic Valve: The aortic valve was not well visualized.  Aortic valve regurgitation is not visualized. No aortic stenosis is present. Pulmonic Valve: The pulmonic valve was normal in structure. Pulmonic valve regurgitation is not  visualized. No evidence of pulmonic stenosis. Aorta: The aortic root and ascending aorta are structurally normal, with no evidence of dilitation and aortic dilatation noted. There is dilatation of the aortic root. IAS/Shunts: No atrial level shunt detected by color flow Doppler.  LEFT VENTRICLE PLAX 2D LVIDd:         4.70 cm     Diastology LVIDs:         3.00 cm     LV e' medial:    4.46 cm/s LV PW:         1.60 cm     LV E/e' medial:  18.8 LV IVS:        1.70 cm     LV e' lateral:   7.62 cm/s LVOT diam:     2.40 cm     LV E/e' lateral: 11.0 LV SV:         80 LV SV Index:   32 LVOT Area:     4.52 cm  LV Volumes (MOD) LV vol d, MOD A2C: 83.9 ml LV vol d, MOD A4C: 81.7 ml LV vol s, MOD A2C: 39.2 ml LV vol s, MOD A4C: 25.3 ml LV SV MOD A2C:     44.8 ml LV SV MOD A4C:     81.7 ml LV SV MOD BP:      50.8 ml RIGHT VENTRICLE             IVC RV S prime:     16.50 cm/s  IVC diam: 1.50 cm TAPSE (M-mode): 2.4 cm LEFT ATRIUM             Index        RIGHT ATRIUM           Index LA diam:        3.00 cm 1.20 cm/m   RA Area:     17.10 cm LA Vol (A2C):   36.8 ml 14.69 ml/m  RA Volume:   47.20 ml  18.84 ml/m LA Vol (A4C):   55.9 ml 22.31 ml/m LA Biplane Vol: 46.6 ml 18.60 ml/m  AORTIC VALVE LVOT Vmax:   109.00 cm/s LVOT Vmean:  70.000 cm/s LVOT VTI:    0.176 m  AORTA Ao Root diam: 3.60 cm Ao Asc diam:  3.70 cm MITRAL VALVE MV Area (PHT): 4.06 cm    SHUNTS MV Decel Time: 187 msec    Systemic VTI:  0.18 m MV E velocity: 83.70 cm/s  Systemic Diam: 2.40 cm MV A velocity: 85.90 cm/s MV E/A ratio:  0.97 Riley Lam MD Electronically signed by Riley Lam MD Signature Date/Time: 01/09/2023/2:22:36 PM    Final    CT Angio Chest PE W/Cm &/Or Wo Cm  Result Date: 01/09/2023 CLINICAL DATA:  57 year old male with history of shortness of breath.  EXAM: CT ANGIOGRAPHY CHEST WITH CONTRAST TECHNIQUE: Multidetector CT imaging of the chest was performed using the standard protocol during bolus administration of intravenous contrast. Multiplanar CT image reconstructions and MIPs were obtained to evaluate the vascular anatomy. RADIATION DOSE REDUCTION: This exam was performed according to the departmental dose-optimization program which includes automated exposure control, adjustment of the mA and/or kV according to patient size and/or use of iterative reconstruction technique. CONTRAST:  75mL OMNIPAQUE IOHEXOL 350 MG/ML SOLN COMPARISON:  No priors. FINDINGS: Cardiovascular: No filling defect in the pulmonary arterial tree to suggest pulmonary embolism. Heart size is normal. There is no significant pericardial fluid, thickening or pericardial calcification. There is aortic atherosclerosis, as well as  atherosclerosis of the great vessels of the mediastinum and the coronary arteries, including calcified atherosclerotic plaque in the left anterior descending and right coronary arteries. Dilatation of the pulmonic trunk (3.6 cm in diameter), concerning for pulmonary arterial hypertension. Mediastinum/Nodes: No pathologically enlarged mediastinal or hilar lymph nodes. Esophagus is unremarkable in appearance. No axillary lymphadenopathy. Lungs/Pleura: Thickening of the peribronchovascular interstitium throughout the lung bases with surrounding peribronchovascular ground-glass attenuation. These findings are nonspecific, and could reflect areas of chronic post infectious or inflammatory scarring, although sequela of mild aspiration is not excluded. No confluent consolidative airspace disease. No pleural effusions. A few scattered small pulmonary nodules are noted, largest of which measure up to 5 mm in the periphery of the right upper lobe (axial image 60 of series 8). Upper Abdomen: Unremarkable. Musculoskeletal: There are no aggressive appearing lytic or blastic lesions  noted in the visualized portions of the skeleton. Review of the MIP images confirms the above findings. IMPRESSION: 1. No evidence of pulmonary embolism. 2. Findings in the lung bases which may simply reflect areas of chronic post infectious or inflammatory scarring, although clinical correlation for signs and symptoms of recent aspiration is also suggested. 3. Dilatation of the pulmonic trunk (3.6 cm in diameter), concerning for pulmonary arterial hypertension. 4. Small pulmonary nodules measuring 5 mm or less in size, nonspecific, but statistically likely benign. No follow-up needed if patient is low-risk (and has no known or suspected primary neoplasm). Non-contrast chest CT can be considered in 12 months if patient is high-risk. This recommendation follows the consensus statement: Guidelines for Management of Incidental Pulmonary Nodules Detected on CT Images: From the Fleischner Society 2017; Radiology 2017; 284:228-243. 5. Aortic atherosclerosis, in addition to two-vessel coronary artery disease. Please note that although the presence of coronary artery calcium documents the presence of coronary artery disease, the severity of this disease and any potential stenosis cannot be assessed on this non-gated CT examination. Assessment for potential risk factor modification, dietary therapy or pharmacologic therapy may be warranted, if clinically indicated. Aortic Atherosclerosis (ICD10-I70.0). Electronically Signed   By: Trudie Reed M.D.   On: 01/09/2023 10:30   DG Chest 2 View  Result Date: 01/09/2023 CLINICAL DATA:  57 year old male with history of shortness of breath. EXAM: CHEST - 2 VIEW COMPARISON:  Chest x-ray 01/20/2020. FINDINGS: There is cephalization of the pulmonary vasculature and slight indistinctness of the interstitial markings suggestive of mild pulmonary edema. Trace bilateral pleural effusions. Mild cardiomegaly. The patient is rotated to the right on today's exam, resulting in distortion  of the mediastinal contours and reduced diagnostic sensitivity and specificity for mediastinal pathology. IMPRESSION: 1. The appearance of the chest suggests mild congestive heart failure, as above. Electronically Signed   By: Trudie Reed M.D.   On: 01/09/2023 09:12    DVT Prophylaxis -SCD /heparin AM Labs Ordered, also please review Full Orders  Family Communication: Admission, patients condition and plan of care including tests being ordered have been discussed with the patient and wife who indicate understanding and agree with the plan   Condition --stable  Shon Hale M.D on 01/09/2023 at 7:53 PM Go to [email or web address] -  for contact info  [facility] - Office  [phone]

## 2023-01-09 NOTE — ED Triage Notes (Signed)
Pt c/o left flank pain since yesterday morning. Pt began getting SOB last night the patient states it hurts his flank to take a deep breathe. No n/v/d or chest pain.

## 2023-01-10 DIAGNOSIS — Z7984 Long term (current) use of oral hypoglycemic drugs: Secondary | ICD-10-CM | POA: Diagnosis not present

## 2023-01-10 DIAGNOSIS — I2510 Atherosclerotic heart disease of native coronary artery without angina pectoris: Secondary | ICD-10-CM | POA: Diagnosis present

## 2023-01-10 DIAGNOSIS — G4733 Obstructive sleep apnea (adult) (pediatric): Secondary | ICD-10-CM | POA: Diagnosis present

## 2023-01-10 DIAGNOSIS — Z1152 Encounter for screening for COVID-19: Secondary | ICD-10-CM | POA: Diagnosis not present

## 2023-01-10 DIAGNOSIS — Z8249 Family history of ischemic heart disease and other diseases of the circulatory system: Secondary | ICD-10-CM | POA: Diagnosis not present

## 2023-01-10 DIAGNOSIS — Z955 Presence of coronary angioplasty implant and graft: Secondary | ICD-10-CM | POA: Diagnosis not present

## 2023-01-10 DIAGNOSIS — I509 Heart failure, unspecified: Secondary | ICD-10-CM

## 2023-01-10 DIAGNOSIS — R0602 Shortness of breath: Secondary | ICD-10-CM | POA: Diagnosis present

## 2023-01-10 DIAGNOSIS — J40 Bronchitis, not specified as acute or chronic: Secondary | ICD-10-CM | POA: Diagnosis present

## 2023-01-10 DIAGNOSIS — Z23 Encounter for immunization: Secondary | ICD-10-CM | POA: Diagnosis not present

## 2023-01-10 DIAGNOSIS — Z794 Long term (current) use of insulin: Secondary | ICD-10-CM | POA: Diagnosis not present

## 2023-01-10 DIAGNOSIS — E782 Mixed hyperlipidemia: Secondary | ICD-10-CM | POA: Diagnosis present

## 2023-01-10 DIAGNOSIS — E1151 Type 2 diabetes mellitus with diabetic peripheral angiopathy without gangrene: Secondary | ICD-10-CM | POA: Diagnosis present

## 2023-01-10 DIAGNOSIS — Z6841 Body Mass Index (BMI) 40.0 and over, adult: Secondary | ICD-10-CM | POA: Diagnosis not present

## 2023-01-10 DIAGNOSIS — J690 Pneumonitis due to inhalation of food and vomit: Secondary | ICD-10-CM | POA: Diagnosis present

## 2023-01-10 DIAGNOSIS — I2721 Secondary pulmonary arterial hypertension: Secondary | ICD-10-CM | POA: Diagnosis present

## 2023-01-10 DIAGNOSIS — K219 Gastro-esophageal reflux disease without esophagitis: Secondary | ICD-10-CM | POA: Diagnosis present

## 2023-01-10 DIAGNOSIS — I110 Hypertensive heart disease with heart failure: Secondary | ICD-10-CM | POA: Diagnosis present

## 2023-01-10 DIAGNOSIS — Z79899 Other long term (current) drug therapy: Secondary | ICD-10-CM | POA: Diagnosis not present

## 2023-01-10 DIAGNOSIS — J9601 Acute respiratory failure with hypoxia: Secondary | ICD-10-CM

## 2023-01-10 DIAGNOSIS — K449 Diaphragmatic hernia without obstruction or gangrene: Secondary | ICD-10-CM | POA: Diagnosis present

## 2023-01-10 DIAGNOSIS — E876 Hypokalemia: Secondary | ICD-10-CM | POA: Diagnosis present

## 2023-01-10 DIAGNOSIS — R918 Other nonspecific abnormal finding of lung field: Secondary | ICD-10-CM | POA: Diagnosis present

## 2023-01-10 DIAGNOSIS — Z7982 Long term (current) use of aspirin: Secondary | ICD-10-CM | POA: Diagnosis not present

## 2023-01-10 DIAGNOSIS — I5033 Acute on chronic diastolic (congestive) heart failure: Secondary | ICD-10-CM | POA: Diagnosis present

## 2023-01-10 HISTORY — DX: Heart failure, unspecified: I50.9

## 2023-01-10 LAB — BASIC METABOLIC PANEL WITH GFR
Anion gap: 10 (ref 5–15)
BUN: 15 mg/dL (ref 6–20)
CO2: 25 mmol/L (ref 22–32)
Calcium: 8.8 mg/dL — ABNORMAL LOW (ref 8.9–10.3)
Chloride: 100 mmol/L (ref 98–111)
Creatinine, Ser: 0.93 mg/dL (ref 0.61–1.24)
GFR, Estimated: >60 mL/min (ref >60)
Glucose, Bld: 161 mg/dL — ABNORMAL HIGH (ref 70–99)
Potassium: 3.9 mmol/L (ref 3.5–5.1)
Sodium: 135 mmol/L (ref 135–145)

## 2023-01-10 LAB — GLUCOSE, CAPILLARY
Glucose-Capillary: 144 mg/dL — ABNORMAL HIGH (ref 70–99)
Glucose-Capillary: 179 mg/dL — ABNORMAL HIGH (ref 70–99)
Glucose-Capillary: 142 mg/dL — ABNORMAL HIGH (ref 70–99)
Glucose-Capillary: 130 mg/dL — ABNORMAL HIGH (ref 70–99)

## 2023-01-10 LAB — HIV ANTIBODY (ROUTINE TESTING W REFLEX): HIV Screen 4th Generation wRfx: NONREACTIVE

## 2023-01-10 NOTE — Plan of Care (Signed)

## 2023-01-10 NOTE — Progress Notes (Signed)
PROGRESS NOTE     Joel Ward, is a 57 y.o. male, DOB - 1965-06-18, UEA:[id number]  Admit date - 01/09/2023   Admitting Physician Shaan Rhoads Mariea Clonts, MD  Outpatient Primary MD for the patient is [facility]  LOS - 0  Chief Complaint  Patient presents with   Shortness of Breath   Flank Pain        Brief Narrative:  57 y.o. male non-smoker with past medical history relevant for CAD with prior stent, OSA noncompliant with CPAP, morbid obesity, HTN and DM admitted on 01/10/2023 with acute diastolic dysfunction CHF in setting of uncontrolled hypertension and untreated OSA  Assessment and Plan: 1) left-sided chest discomfort--history of CAD with prior stent (PCI/DES mid LAD on 03/16/2017) -Troponin is 8, repeat troponin is 8, EKG sinus tachycardia without acute finding -Echo on 01/09/2023 shows  ejection fraction, by estimation, is 60 to 65% . The left ventricle has normal function. The left ventricle has no regional wall motion abnormalities. There is severe concentric left ventricular hypertrophy. Left ventricular diastolic parameters are consistent with Grade I diastolic dysfunction ( impaired relaxation)  -No mitral stenosis or aortic stenosis -Continue aspirin and atorvastatin for CAD   2)Bronchitis and possible aspiration pneumonia---CTA chest findings noted -COVID-negative - leukocytosis noted -Recently treated with Z-Pak -Treat empirically with Augmentin and mucolytics   3)HFpEF--- acute diastolic dysfunction CHF -Chest x-ray consistent with CHF -BNP is not elevated but patient is morbidly obese -CTA chest with concerns for possible pulmonary artery hypertension 01/10/23- -weaned off oxygen -Voiding well, dyspnea on exertion persist but no dyspnea at rest -Continue IV Lasix -Daily weight and fluid input and output management -Reds vest   4) pulmonary nodules--- patient is a non-smoker -Repeat CT chest in 1 year   5) hypokalemia--- replace and  monitor especially while on diuresis as above #3   6)DM2-A1c 7.1 -Use Novolog/Humalog Sliding scale insulin with Accu-Cheks/Fingersticks as ordered    7)Morbid Obesity/OSA -Low calorie diet, portion control and increase physical activity discussed with patient -Body mass index is 42.82 kg/m. --- CPAP advised qhs   Status is: Inpatient   Remains inpatient appropriate because:    Dispo: The patient is from: Home              Anticipated d/c is to: Home  Code Status :  -  Code Status: Full Code   Family Communication:  (patient is alert, awake and coherent)  Discussed with wife at bedside  DVT Prophylaxis  :   - SCDs   heparin injection 5,000 Units Start: 01/09/23 2200 SCDs Start: 01/09/23 1946 Place TED hose Start: 01/09/23 1946   Lab Results  Component Value Date   PLT 294 01/09/2023   Inpatient Medications  Scheduled Meds:  amLODipine  5 mg Oral Daily   amoxicillin-clavulanate  1 tablet Oral Q12H   aspirin  81 mg Oral Daily   atorvastatin  40 mg Oral q1800   dextromethorphan-guaiFENesin  1 tablet Oral BID   furosemide  20 mg Intravenous Daily   heparin  5,000 Units Subcutaneous Q8H   insulin aspart  0-20 Units Subcutaneous TID WC   insulin aspart  0-5 Units Subcutaneous QHS   insulin aspart  3 Units Subcutaneous TID WC   insulin glargine-yfgn  60 Units Subcutaneous QHS   lisinopril  20 mg Oral Daily   melatonin  3 mg Oral QHS   sodium chloride flush  3 mL Intravenous Q12H   sodium chloride flush  3 mL Intravenous Q12H  Continuous Infusions:  sodium chloride     PRN Meds:.sodium chloride, acetaminophen **OR** acetaminophen, albuterol, bisacodyl, labetalol, ondansetron **OR** ondansetron (ZOFRAN) IV, polyethylene glycol, sodium chloride flush   Anti-infectives (From admission, onward)    Start     Dose/Rate Route Frequency Ordered Stop   01/09/23 2200  amoxicillin-clavulanate (AUGMENTIN) 875-125 MG per tablet 1 tablet        1 tablet Oral Every 12 hours  01/09/23 1941        Subjective: Alberteen Sam today has no fevers, no emesis,    Wife at bedside -Voiding well -No further chest discomfort -Dyspnea on exertion persist -Hypoxia resolving  Objective: Vitals:   01/09/23 1956 01/09/23 2135 01/10/23 0405 01/10/23 0500  BP:  (!) 147/82 (!) 148/69   Pulse:  82 94   Resp:  20 20   Temp:  98.2 F (36.8 C) 99.1 F (37.3 C)   TempSrc:  Oral Oral   SpO2: 96% 96% 94%   Weight:    (!) 137.8 kg  Height:        Intake/Output Summary (Last 24 hours) at 01/10/2023 1519 Last data filed at 01/10/2023 1100 Gross per 24 hour  Intake 240 ml  Output 3650 ml  Net -3410 ml   Filed Weights   01/09/23 0810 01/09/23 1700 01/10/23 0500  Weight: 136.1 kg (!) 139.3 kg (!) 137.8 kg    Physical Exam  Gen:- Awake Alert, no conversational dyspnea HEENT:- [place].AT, No sclera icterus Neck-Supple Neck,No JVD,.  Lungs-improving air movement, no wheezing  CV- S1, S2 normal, regular  Abd-  +ve B.Sounds, Abd Soft, No tenderness,    Extremity/Skin:-1+ edema, pedal pulses present  Psych-affect is appropriate, oriented x3 Neuro-no new focal deficits, no tremors  Data Reviewed: I have personally reviewed following labs and imaging studies  CBC: Recent Labs  Lab 01/09/23 0840  WBC 12.5*  NEUTROABS 9.5*  HGB 12.3*  HCT 38.9*  MCV 87.6  PLT 294   Basic Metabolic Panel: Recent Labs  Lab 01/09/23 0840 01/10/23 0536  NA 138 135  K 3.4* 3.9  CL 103 100  CO2 26 25  GLUCOSE 115* 161*  BUN 12 15  CREATININE 0.99 0.93  CALCIUM 8.8* 8.8*   GFR: Estimated Creatinine Clearance: 127.3 mL/min (by C-G formula based on SCr of 0.93 mg/dL).  Recent Results (from the past 240 hour(s))  SARS Coronavirus 2 by RT PCR (hospital order, performed in [facility] hospital lab) *cepheid single result test* Anterior Nasal Swab     Status: None   Collection Time: 01/09/23  1:10 PM   Specimen: Anterior Nasal Swab  Result Value Ref Range Status   SARS  Coronavirus 2 by RT PCR NEGATIVE NEGATIVE Final    Comment: (NOTE) SARS-CoV-2 target nucleic acids are NOT DETECTED.  The SARS-CoV-2 RNA is generally detectable in upper and lower respiratory specimens during the acute phase of infection. The lowest concentration of SARS-CoV-2 viral copies this assay can detect is 250 copies / mL. A negative result does not preclude SARS-CoV-2 infection and should not be used as the sole basis for treatment or other patient management decisions.  A negative result may occur with improper specimen collection / handling, submission of specimen other than nasopharyngeal swab, presence of viral mutation(s) within the areas targeted by this assay, and inadequate number of viral copies (<250 copies / mL). A negative result must be combined with clinical observations, patient history, and epidemiological information.  Fact Sheet for Patients:   RoadLapTop.co.za  Fact  Sheet for Healthcare Providers: http://kim-miller.com/  This test is not yet approved or  cleared by the [place] and has been authorized for detection and/or diagnosis of SARS-CoV-2 by FDA under an Emergency Use Authorization (EUA).  This EUA will remain in effect (meaning this test can be used) for the duration of the COVID-19 declaration under Section 564(b)(1) of the Act, 21 U.S.C. section 360bbb-3(b)(1), unless the authorization is terminated or revoked sooner.  Performed at [facility], [address] [place], [place] [address]    Radiology Studies: ECHOCARDIOGRAM COMPLETE  Result Date: 01/09/2023    ECHOCARDIOGRAM REPORT   Patient Name:   AMARIOUS BERST Date of Exam: 01/09/2023 Medical Rec #:  [id number]         Height:       71.0 in Accession #:    [id number]        Weight:       300.0 lb Date of Birth:  1966/05/01         BSA:          2.505 m Patient Age:    57 years          BP:           167/95 mmHg Patient Gender: M                  HR:           86 bpm. [facility]am [facility] Procedure: 2D Echo, Cardiac Doppler, Color Doppler and Intracardiac            Opacification Agent Indications:    R94.31 Abnormal EKG  History:        Patient has no prior history of Echocardiogram examinations.                 Signs/Symptoms:Shortness of Breath, Dyspnea and Chest Pain; Risk                 Factors:Diabetes, Dyslipidemia and Hypertension.  Sonographer:    Sheralyn Boatman RDCS Referring Phys: [phone] [facility]  Sonographer Comments: Technically difficult study due to poor echo windows, suboptimal parasternal window, suboptimal apical window, suboptimal subcostal window and patient is obese. Image acquisition challenging due to patient body habitus. IMPRESSIONS  1. Left ventricular ejection fraction, by estimation, is 60 to 65%. The left ventricle has normal function. The left ventricle has no regional wall motion abnormalities. There is severe concentric left ventricular hypertrophy. Left ventricular diastolic  parameters are consistent with Grade I diastolic dysfunction (impaired relaxation).  2. Right ventricular systolic function is normal. The right ventricular size is mildly enlarged.  3. The mitral valve is normal in structure. No evidence of mitral valve regurgitation. No evidence of mitral stenosis.  4. The aortic valve was not well visualized. Aortic valve regurgitation is not visualized. No aortic stenosis is present.  5. Aortic dilatation noted. There is dilatation of the aortic root. Comparison(s): No prior Echocardiogram. FINDINGS  Left Ventricle: Left ventricular ejection fraction, by estimation, is 60 to 65%. The left ventricle has normal function. The left ventricle has no regional wall motion abnormalities. Definity contrast agent was given IV to delineate the left ventricular  endocardial borders. The left ventricular internal cavity size was normal in size. There is severe concentric left ventricular hypertrophy. Left  ventricular diastolic parameters are consistent with Grade I diastolic dysfunction (impaired relaxation). Right Ventricle: The right ventricular size is mildly enlarged. Right vetricular wall thickness was not well visualized. Right  ventricular systolic function is normal. Left Atrium: Left atrial size was normal in size. Right Atrium: Right atrial size was normal in size. Pericardium: Trivial pericardial effusion is present. The pericardial effusion is anterior to the right ventricle. Presence of epicardial fat layer. Mitral Valve: The mitral valve is normal in structure. No evidence of mitral valve regurgitation. No evidence of mitral valve stenosis. Tricuspid Valve: The tricuspid valve is not well visualized. Tricuspid valve regurgitation is not demonstrated. No evidence of tricuspid stenosis. Aortic Valve: The aortic valve was not well visualized. Aortic valve regurgitation is not visualized. No aortic stenosis is present. Pulmonic Valve: The pulmonic valve was normal in structure. Pulmonic valve regurgitation is not visualized. No evidence of pulmonic stenosis. Aorta: The aortic root and ascending aorta are structurally normal, with no evidence of dilitation and aortic dilatation noted. There is dilatation of the aortic root. IAS/Shunts: No atrial level shunt detected by color flow Doppler.  LEFT VENTRICLE PLAX 2D LVIDd:         4.70 cm     Diastology LVIDs:         3.00 cm     LV e' medial:    4.46 cm/s LV PW:         1.60 cm     LV E/e' medial:  18.8 LV IVS:        1.70 cm     LV e' lateral:   7.62 cm/s LVOT diam:     2.40 cm     LV E/e' lateral: 11.0 LV SV:         80 LV SV Index:   32 LVOT Area:     4.52 cm  LV Volumes (MOD) LV vol d, MOD A2C: 83.9 ml LV vol d, MOD A4C: 81.7 ml LV vol s, MOD A2C: 39.2 ml LV vol s, MOD A4C: 25.3 ml LV SV MOD A2C:     44.8 ml LV SV MOD A4C:     81.7 ml LV SV MOD BP:      50.8 ml RIGHT VENTRICLE             IVC RV S prime:     16.50 cm/s  IVC diam: 1.50 cm TAPSE (M-mode): 2.4  cm LEFT ATRIUM             Index        RIGHT ATRIUM           Index LA diam:        3.00 cm 1.20 cm/m   RA Area:     17.10 cm LA Vol (A2C):   36.8 ml 14.69 ml/m  RA Volume:   47.20 ml  18.84 ml/m LA Vol (A4C):   55.9 ml 22.31 ml/m LA Biplane Vol: 46.6 ml 18.60 ml/m  AORTIC VALVE LVOT Vmax:   109.00 cm/s LVOT Vmean:  70.000 cm/s LVOT VTI:    0.176 m  AORTA Ao Root diam: 3.60 cm Ao Asc diam:  3.70 cm MITRAL VALVE MV Area (PHT): 4.06 cm    SHUNTS MV Decel Time: 187 msec    Systemic VTI:  0.18 m MV E velocity: 83.70 cm/s  Systemic Diam: 2.40 cm MV A velocity: 85.90 cm/s MV E/A ratio:  0.97 Riley Lam MD Electronically signed by Riley Lam MD Signature Date/Time: 01/09/2023/2:22:36 PM    Final    CT Angio Chest PE W/Cm &/Or Wo Cm  Result Date: 01/09/2023 CLINICAL DATA:  57 year old male with history of shortness of breath. EXAM:  CT ANGIOGRAPHY CHEST WITH CONTRAST TECHNIQUE: Multidetector CT imaging of the chest was performed using the standard protocol during bolus administration of intravenous contrast. Multiplanar CT image reconstructions and MIPs were obtained to evaluate the vascular anatomy. RADIATION DOSE REDUCTION: This exam was performed according to the departmental dose-optimization program which includes automated exposure control, adjustment of the mA and/or kV according to patient size and/or use of iterative reconstruction technique. CONTRAST:  75mL OMNIPAQUE IOHEXOL 350 MG/ML SOLN COMPARISON:  No priors. FINDINGS: Cardiovascular: No filling defect in the pulmonary arterial tree to suggest pulmonary embolism. Heart size is normal. There is no significant pericardial fluid, thickening or pericardial calcification. There is aortic atherosclerosis, as well as atherosclerosis of the great vessels of the mediastinum and the coronary arteries, including calcified atherosclerotic plaque in the left anterior descending and right coronary arteries. Dilatation of the pulmonic trunk (3.6  cm in diameter), concerning for pulmonary arterial hypertension. Mediastinum/Nodes: No pathologically enlarged mediastinal or hilar lymph nodes. Esophagus is unremarkable in appearance. No axillary lymphadenopathy. Lungs/Pleura: Thickening of the peribronchovascular interstitium throughout the lung bases with surrounding peribronchovascular ground-glass attenuation. These findings are nonspecific, and could reflect areas of chronic post infectious or inflammatory scarring, although sequela of mild aspiration is not excluded. No confluent consolidative airspace disease. No pleural effusions. A few scattered small pulmonary nodules are noted, largest of which measure up to 5 mm in the periphery of the right upper lobe (axial image 60 of series 8). Upper Abdomen: Unremarkable. Musculoskeletal: There are no aggressive appearing lytic or blastic lesions noted in the visualized portions of the skeleton. Review of the MIP images confirms the above findings. IMPRESSION: 1. No evidence of pulmonary embolism. 2. Findings in the lung bases which may simply reflect areas of chronic post infectious or inflammatory scarring, although clinical correlation for signs and symptoms of recent aspiration is also suggested. 3. Dilatation of the pulmonic trunk (3.6 cm in diameter), concerning for pulmonary arterial hypertension. 4. Small pulmonary nodules measuring 5 mm or less in size, nonspecific, but statistically likely benign. No follow-up needed if patient is low-risk (and has no known or suspected primary neoplasm). Non-contrast chest CT can be considered in 12 months if patient is high-risk. This recommendation follows the consensus statement: Guidelines for Management of Incidental Pulmonary Nodules Detected on CT Images: From the Fleischner Society 2017; Radiology 2017; 284:228-243. 5. Aortic atherosclerosis, in addition to two-vessel coronary artery disease. Please note that although the presence of coronary artery calcium  documents the presence of coronary artery disease, the severity of this disease and any potential stenosis cannot be assessed on this non-gated CT examination. Assessment for potential risk factor modification, dietary therapy or pharmacologic therapy may be warranted, if clinically indicated. Aortic Atherosclerosis (ICD10-I70.0). Electronically Signed   By: Trudie Reed M.D.   On: 01/09/2023 10:30   DG Chest 2 View  Result Date: 01/09/2023 CLINICAL DATA:  56 year old male with history of shortness of breath. EXAM: CHEST - 2 VIEW COMPARISON:  Chest x-ray 01/20/2020. FINDINGS: There is cephalization of the pulmonary vasculature and slight indistinctness of the interstitial markings suggestive of mild pulmonary edema. Trace bilateral pleural effusions. Mild cardiomegaly. The patient is rotated to the right on today's exam, resulting in distortion of the mediastinal contours and reduced diagnostic sensitivity and specificity for mediastinal pathology. IMPRESSION: 1. The appearance of the chest suggests mild congestive heart failure, as above. Electronically Signed   By: Trudie Reed M.D.   On: 01/09/2023 09:12    Scheduled Meds:  amLODipine  5 mg Oral Daily   amoxicillin-clavulanate  1 tablet Oral Q12H   aspirin  81 mg Oral Daily   atorvastatin  40 mg Oral q1800   dextromethorphan-guaiFENesin  1 tablet Oral BID   furosemide  20 mg Intravenous Daily   heparin  5,000 Units Subcutaneous Q8H   insulin aspart  0-20 Units Subcutaneous TID WC   insulin aspart  0-5 Units Subcutaneous QHS   insulin aspart  3 Units Subcutaneous TID WC   insulin glargine-yfgn  60 Units Subcutaneous QHS   lisinopril  20 mg Oral Daily   melatonin  3 mg Oral QHS   sodium chloride flush  3 mL Intravenous Q12H   sodium chloride flush  3 mL Intravenous Q12H   Continuous Infusions:  sodium chloride      LOS: 0 days   Shon Hale M.D on 01/10/2023 at 3:19 PM  Go to [email or web address] - for contact info  [facility] - Office  [phone]  If 7PM-7AM, please contact night-coverage [email or web address] 01/10/2023, 3:19 PM

## 2023-01-10 NOTE — Progress Notes (Signed)
Patient ambulated to restroom checked his oxygen saturation after ambulation was 85-86. Patient then placed on 2L came up to 96%. No c/o pain or discomfort thus far in shift. Appetite good. Patient educated on CHF and how to manage by eating low sodium diet foods.

## 2023-01-10 NOTE — Progress Notes (Signed)
   01/10/23 1100  ReDS Vest / Clip  Station Marker D  Ruler Value 41  Anatomical Comments low quality unable to obtain

## 2023-01-11 DIAGNOSIS — J9601 Acute respiratory failure with hypoxia: Secondary | ICD-10-CM | POA: Diagnosis not present

## 2023-01-11 LAB — GLUCOSE, CAPILLARY
Glucose-Capillary: 99 mg/dL (ref 70–99)
Glucose-Capillary: 159 mg/dL — ABNORMAL HIGH (ref 70–99)

## 2023-01-11 MED ORDER — CARVEDILOL 3.125 MG PO TABS
3.1250 mg | ORAL_TABLET | Freq: Two times a day (BID) | ORAL | 5 refills | Status: AC
Start: 2023-01-11 — End: 2024-01-11

## 2023-01-11 MED ORDER — LISINOPRIL 20 MG PO TABS: 20.0000 mg | ORAL_TABLET | Freq: Every day | ORAL | 5 refills | Status: AC

## 2023-01-11 MED ORDER — ATORVASTATIN CALCIUM 40 MG PO TABS: 40.0000 mg | ORAL_TABLET | Freq: Every day | ORAL | 4 refills | Status: AC

## 2023-01-11 MED ORDER — DM-GUAIFENESIN ER 30-600 MG PO TB12: 1.0000 | ORAL_TABLET | Freq: Two times a day (BID) | ORAL | 0 refills | Status: AC

## 2023-01-11 MED ORDER — ASPIRIN 81 MG PO CHEW: 81.0000 mg | CHEWABLE_TABLET | Freq: Every day | ORAL | 11 refills | Status: DC

## 2023-01-11 MED ORDER — AMLODIPINE BESYLATE 2.5 MG PO TABS
2.5000 mg | ORAL_TABLET | Freq: Every day | ORAL | 4 refills | Status: DC
Start: 2023-01-11 — End: 2023-01-11

## 2023-01-11 MED ORDER — POTASSIUM CHLORIDE ER 10 MEQ PO TBCR: 10.0000 meq | EXTENDED_RELEASE_TABLET | Freq: Every day | ORAL | 2 refills | Status: DC

## 2023-01-11 MED ORDER — TOUJEO SOLOSTAR 300 UNIT/ML ~~LOC~~ SOPN: 80.0000 [IU] | PEN_INJECTOR | Freq: Every evening | SUBCUTANEOUS | 1 refills | Status: DC

## 2023-01-11 MED ORDER — FUROSEMIDE 20 MG PO TABS
20.0000 mg | ORAL_TABLET | Freq: Every day | ORAL | 4 refills | Status: AC
Start: 2023-01-11 — End: 2024-01-11

## 2023-01-11 MED ORDER — ASPIRIN 81 MG PO TBEC: 81.0000 mg | DELAYED_RELEASE_TABLET | Freq: Every day | ORAL | 2 refills | Status: AC

## 2023-01-11 MED ORDER — DAPAGLIFLOZIN PROPANEDIOL 10 MG PO TABS: 10.0000 mg | ORAL_TABLET | Freq: Every day | ORAL | 3 refills | Status: DC

## 2023-01-11 MED ORDER — METFORMIN HCL ER 500 MG PO TB24: 500.0000 mg | ORAL_TABLET | Freq: Two times a day (BID) | ORAL | 3 refills | Status: DC

## 2023-01-11 MED ORDER — PNEUMOCOCCAL 20-VAL CONJ VACC 0.5 ML IM SUSY
0.5000 mL | PREFILLED_SYRINGE | INTRAMUSCULAR | Status: AC
  Administered 2023-01-11: 0.5 mL via INTRAMUSCULAR
  Filled 2023-01-11: qty 0.5

## 2023-01-11 MED ORDER — AMOXICILLIN-POT CLAVULANATE 875-125 MG PO TABS: 1.0000 | ORAL_TABLET | Freq: Two times a day (BID) | ORAL | 0 refills | Status: AC

## 2023-01-11 NOTE — Progress Notes (Signed)
Mobility Specialist Progress Note:    01/11/23 1015  Mobility  Activity Ambulated with assistance in hallway  Level of Assistance Standby assist, set-up cues, supervision of patient - no hands on  Assistive Device None  Distance Ambulated (ft) 160 ft  Range of Motion/Exercises All extremities;Active  Activity Response Tolerated well  Mobility Referral Yes  $Mobility charge 1 Mobility  Mobility Specialist Start Time (ACUTE ONLY) 1015  Mobility Specialist Stop Time (ACUTE ONLY) 1025  Mobility Specialist Time Calculation (min) (ACUTE ONLY) 10 min   Pt was received in bed, agreeable to mobility. Required no AD and SBA to stand and ambulate. SpO2 95% on RA while sitting EOB. SpO2 93% on RA while standing bedside. SpO2 88% on RA while ambulating, took a standing rest break. Recovered, SpO2 93% on RA. Returned pt to room, SpO2 95% on RA. Left pt in bed, all needs met.   Joel Ward Mobility Specialist Please contact via Special educational needs teacher or  Rehab office at [phone]

## 2023-01-11 NOTE — Progress Notes (Signed)
  Pre- Ambulation Vital Signs: B/P: 106/73 HR: 83 Resp: 18 SaO2: 94% on RA  Pt ambulated entire length of hallway to nurses station and back with only slight SOB, SaO2 stayed 92% - 93% entire time of ambulation. HR maxed at 106 ST.   Post Ambulation Vital Signs: B/P: 121/80 HR: 93 Resp: 20 SaO2: 93% RA

## 2023-01-11 NOTE — Discharge Instructions (Signed)
1)Very low-salt diet advised--less than 2 g of sodium per day advised 2)Weigh yourself daily, call if you gain more than 3 pounds in 1 day or more than 5 pounds in 1 week as your diuretic medications may need to be adjusted 3)Limit your Fluid  intake to No more than 60 ounces (1.8 Liters) per day 4)Morbid Obesity--Low calorie diet, portion control and increase physical activity advised -Body mass index is 41.94 kg/m. 5) outpatient follow-up with pulmonologist for sleep study and CPAP machine/mask titration advised--to treat your sleep apnea 6)Continue compression stockings to be put on while you are awake--please take them off when you go to bed 7)Avoid ibuprofen/Advil/Aleve/Motrin/Goody Powders/Naproxen/BC powders/Meloxicam/Diclofenac/Indomethacin and other Nonsteroidal anti-inflammatory medications as these will make you more likely to bleed and can cause stomach ulcers, can also cause Kidney problems.  8)Repeat BMP blood test and blood pressure recheck with primary care physician in 1 to 2 weeks or so

## 2023-01-11 NOTE — Plan of Care (Signed)

## 2023-01-11 NOTE — Discharge Summary (Addendum)
Joel Ward, is a 57 y.o. male  DOB 21-Aug-1965  MRN [id number].  Admission date:  01/09/2023  Admitting Physician  Shon Hale, MD  Discharge Date:  01/11/2023   Primary MD  Pllc, [facility]  Recommendations for primary care physician for things to follow:   1)Very low-salt diet advised--less than 2 g of sodium per day advised 2)Weigh yourself daily, call if you gain more than 3 pounds in 1 day or more than 5 pounds in 1 week as your diuretic medications may need to be adjusted 3)Limit your Fluid  intake to No more than 60 ounces (1.8 Liters) per day 4)Morbid Obesity--Low calorie diet, portion control and increase physical activity advised -Body mass index is 41.94 kg/m. 5) outpatient follow-up with pulmonologist for sleep study and CPAP machine/mask titration advised--to treat your sleep apnea 6)Continue compression stockings to be put on while you are awake--please take them off when you go to bed 7)Avoid ibuprofen/Advil/Aleve/Motrin/Goody Powders/Naproxen/BC powders/Meloxicam/Diclofenac/Indomethacin and other Nonsteroidal anti-inflammatory medications as these will make you more likely to bleed and can cause stomach ulcers, can also cause Kidney problems.  8)Repeat BMP blood test and blood pressure recheck with primary care physician in 1 to 2 weeks or so  Admission Diagnosis  Shortness of breath [R06.02] Pulmonary nodules [R91.8] Acute respiratory failure with hypoxia (HCC) [J96.01] Left-sided chest pain [R07.9] Acute exacerbation of CHF (congestive heart failure) (HCC) [I50.9]   Discharge Diagnosis  Shortness of breath [R06.02] Pulmonary nodules [R91.8] Acute respiratory failure with hypoxia (HCC) [J96.01] Left-sided chest pain [R07.9] Acute exacerbation of CHF (congestive heart failure) (HCC) [I50.9]   Principal Problem:   Acute respiratory failure with hypoxia  (HCC) Active Problems:   OSA (obstructive sleep apnea)   (HFpEF)/chronic diastolic dysfunction CHF   DM type 2 causing vascular disease (HCC)   Morbid obesity (HCC)   Coronary artery disease involving native coronary artery of native heart with unstable angina pectoris (HCC)   Essential hypertension, benign   Insulin long-term use (HCC)   Acute exacerbation of CHF (congestive heart failure) (HCC)      Past Medical History:  Diagnosis Date   CAD (coronary artery disease), native coronary artery    10/18 PCI/DES mLAD, normal EF on LV gram   Diabetes mellitus    Type 2 diagnosed 2 weeks ago   Hiatal hernia with gastroesophageal reflux    Hypertension    Mixed hyperlipidemia    Morbid obesity (HCC)    OSA on CPAP    Urinary frequency     Past Surgical History:  Procedure Laterality Date   CIRCUMCISION     1988   CORONARY STENT INTERVENTION N/A 03/16/2017   Procedure: CORONARY STENT INTERVENTION;  Surgeon: Lyn Records, MD;  [facility]: [facility] INVAS[facility] CV LAB;  Service: Cardiovascular;  Laterality: N/A;   ESOPHAGEAL DILATION N/A 02/08/2015   Procedure: ESOPHAGEAL DILATION;  Surgeon: Malissa Hippo, MD;  Location: [facility] ENDO SU[facility];  Service: Endoscopy;  Laterality: N/A;   ESOPHAGOGASTRODUODENOSCOPY N/A 02/08/2015   Procedure: ESOPHAGOGASTRODUODENOSCOPY (EGD);  Surgeon:  Malissa Hippo, MD;  Location: [facility];  Service: Endoscopy;  Laterality: N/A;  1055   ESOPHAGOGASTRODUODENOSCOPY (EGD) WITH ESOPHAGEAL DILATION  03/29/2012   Procedure: ESOPHAGOGASTRODUODENOSCOPY (EGD) WITH ESOPHAGEAL DILATION;  Surgeon: Malissa Hippo, MD;  Location: [facility];  Service: Endoscopy;  Laterality: N/A;  730   LEFT HEART CATH AND CORONARY ANGIOGRAPHY N/A 03/16/2017   Procedure: LEFT HEART CATH AND CORONARY ANGIOGRAPHY;  Surgeon: Lyn Records, MD;  Location: [facility] INVASIVE CV LAB;  Service: Cardiovascular;  Laterality: N/A;   ULTRASOUND GUIDANCE FOR VASCULAR ACCESS  03/16/2017   Procedure: Ultrasound  Guidance For Vascular Access;  Surgeon: Lyn Records, MD;  Location: [facility] INVASIVE CV LAB;  Service: Cardiovascular;;     HPI  from the history and physical done on the day of admission:   Joel Ward  is a 57 y.o. male non-smoker with past medical history relevant for CAD with prior stent, OSA noncompliant with CPAP, morbid obesity, HTN and DM presents to the ED with complaints of left-sided chest discomfort and dyspnea since 01/08/2023 -Recently treated for bronchitis/URI with Z-Pak -Additional history obtained from wife at bedside- No fever  Or chills  -No leg pains or leg swelling -Chest discomfort is somewhat pleuritic, chest discomfort is worse with cough, but cough is not productive no recent falls or chest injury   No Nausea, Vomiting or Diarrhea -No urinary symptoms -Chest x-ray suggest mild CHF -CTA chest without PE, findings of possible aspiration pneumonia, and pulmonary artery hypertension -BNP 11--patient is morbidly obese -COVID-negative -Troponin is 8, repeat troponin is 8, EKG sinus tachycardia without acute findings -WBC 12.5-hemoglobin 12.3 -Potassium 3.4, creatinine 0.99   Hospital Course:   Brief Narrative:  57 y.o. male non-smoker with past medical history relevant for CAD with prior stent, OSA noncompliant with CPAP, morbid obesity, HTN and DM admitted on 01/10/2023 with acute diastolic dysfunction CHF in setting of uncontrolled hypertension and untreated OSA  Assessment and Plan: 1) left-sided chest discomfort--history of CAD with prior stent (PCI/DES mid LAD on 03/16/2017) -Troponin is 8, repeat troponin is 8, EKG sinus tachycardia without acute finding -Echo on 01/09/2023 shows  ejection fraction, by estimation, is 60 to 65% . The left ventricle has normal function. The left ventricle has no regional wall motion abnormalities. There is severe concentric left ventricular hypertrophy. Left ventricular diastolic parameters are consistent with Grade I diastolic  dysfunction ( impaired relaxation)  -No mitral stenosis or aortic stenosis -Continue aspirin and atorvastatin for CAD --- Add Coreg   2)Bronchitis and possible aspiration pneumonia---CTA chest findings noted -COVID-negative - WBC 12.5>>11.3 -Recently treated with Z-Pak -Okay to complete Augmentin and mucolytics   3)HFpEF--- acute diastolic dysfunction CHF -Chest x-ray consistent with CHF -BNP is not elevated but patient is morbidly obese -CTA chest with concerns for possible pulmonary artery hypertension -weaned off oxygen successfully -Voiding well, dyspnea on exertion persist but no dyspnea at rest -Treated with IV Lasix, okay to discharge on oral Lasix Weight 307 >>303>>300 --Low-salt diet advised   4) pulmonary nodules--- patient is a non-smoker -Repeat CT chest in 1 year   5) hypokalemia--- replaced , continue potassium replacement while on diuretics   6)DM2-A1c 7.1 -Resume metformin and Farxiga   7)Morbid Obesity/OSA -Low calorie diet, portion control and increase physical activity discussed with patient -Body mass index is 42.82 kg/m. --- Outpatient sleep study for CPAP advised qhs  Discharge Condition: stable  Follow UP   Follow-up Information     Pllc, [employer].  Schedule an appointment as soon as possible for a visit in 1 week(s).   Specialty: Family Medicine Why: BMP Blood Tests and BP recheck Contact information: [address] [place] [address] [phone]                Diet and Activity recommendation:  As advised  Discharge Instructions    Discharge Instructions     Call MD for:  difficulty breathing, headache or visual disturbances   Complete by: As directed    Call MD for:  persistant dizziness or light-headedness   Complete by: As directed    Call MD for:  persistant nausea and vomiting   Complete by: As directed    Call MD for:  temperature >100.4   Complete by: As directed    Diet - low sodium  heart healthy   Complete by: As directed    Diet Carb Modified   Complete by: As directed    Discharge instructions   Complete by: As directed    1)Very low-salt diet advised--less than 2 g of sodium per day advised 2)Weigh yourself daily, call if you gain more than 3 pounds in 1 day or more than 5 pounds in 1 week as your diuretic medications may need to be adjusted 3)Limit your Fluid  intake to No more than 60 ounces (1.8 Liters) per day 4)Morbid Obesity--Low calorie diet, portion control and increase physical activity advised -Body mass index is 41.94 kg/m. 5) outpatient follow-up with pulmonologist for sleep study and CPAP machine/mask titration advised--to treat your sleep apnea 6)Continue compression stockings to be put on while you are awake--please take them off when you go to bed 7)Avoid ibuprofen/Advil/Aleve/Motrin/Goody Powders/Naproxen/BC powders/Meloxicam/Diclofenac/Indomethacin and other Nonsteroidal anti-inflammatory medications as these will make you more likely to bleed and can cause stomach ulcers, can also cause Kidney problems.  8)Repeat BMP blood test and blood pressure recheck with primary care physician in 1 to 2 weeks or so   Increase activity slowly   Complete by: As directed        Discharge Medications     Allergies as of 01/11/2023       Reactions   Bee Venom    Shellfish Allergy Swelling   Turns red   Strawberry Extract Rash        Medication List     STOP taking these medications    aspirin 81 MG chewable tablet Replaced by: aspirin EC 81 MG tablet   ibuprofen 800 MG tablet Commonly known as: ADVIL       TAKE these medications    amoxicillin-clavulanate 875-125 MG tablet Commonly known as: AUGMENTIN Take 1 tablet by mouth 2 (two) times daily for 5 days.   aspirin EC 81 MG tablet Take 1 tablet (81 mg total) by mouth daily with breakfast. Replaces: aspirin 81 MG chewable tablet   atorvastatin 40 MG tablet Commonly known as:  LIPITOR Take 1 tablet (40 mg total) by mouth daily.   B-D ULTRAFINE III SHORT PEN 31G X 8 MM Misc Generic drug: Insulin Pen Needle USE 1 PEN NEEDLE DAILY AS DIRECTED   carvedilol 3.125 MG tablet Commonly known as: Coreg Take 1 tablet (3.125 mg total) by mouth 2 (two) times daily. For BP   dapagliflozin propanediol 10 MG Tabs tablet Commonly known as: Farxiga Take 1 tablet (10 mg total) by mouth daily before breakfast.   dextromethorphan-guaiFENesin 30-600 MG 12hr tablet Commonly known as: MUCINEX DM Take 1 tablet by mouth 2 (two) times daily.  FreeStyle Libre 14 Day Reader Hardie Pulley Use to check blood glucose 4 times daily.   FreeStyle Libre 3 Sensor Misc USE TO CHECK GLUCOSE CONTINUOUSLY. CHANGE SENSOR EVERY 14 DAYS   furosemide 20 MG tablet Commonly known as: Lasix Take 1 tablet (20 mg total) by mouth daily.   lisinopril 20 MG tablet Commonly known as: ZESTRIL Take 1 tablet (20 mg total) by mouth daily.   Melatonin 2.5 MG Caps Take 2.5 mg by mouth at bedtime as needed (sleep).   metFORMIN 500 MG 24 hr tablet Commonly known as: GLUCOPHAGE-XR Take 1 tablet (500 mg total) by mouth 2 (two) times daily with a meal. What changed: See the new instructions.   nitroGLYCERIN 0.4 MG SL tablet Commonly known as: Nitrostat Place 1 tablet (0.4 mg total) under the tongue every 5 (five) minutes as needed.   potassium chloride 10 MEQ tablet Commonly known as: KLOR-CON Take 1 tablet (10 mEq total) by mouth daily. Take While taking Lasix/furosemide   sildenafil 100 MG tablet Commonly known as: VIAGRA Take 100 mg by mouth as needed.   Toujeo SoloStar 300 UNIT/ML Solostar Pen Generic drug: insulin glargine (1 Unit Dial) Inject 80 Units into the skin at bedtime.   Vitamin D3 125 MCG (5000 UT) Caps Take 1 capsule (5,000 Units total) by mouth daily.       Major procedures and Radiology Reports - PLEASE review detailed and final reports for all details, in brief -    ECHOCARDIOGRAM COMPLETE  Result Date: 01/09/2023    ECHOCARDIOGRAM REPORT   Patient Name:   VINCENZO WAMPLER Date of Exam: 01/09/2023 Medical Rec #:  [id number]         Height:       71.0 in Accession #:    [id number]        Weight:       300.0 lb Date of Birth:  1965/12/10         BSA:          2.505 m Patient Age:    57 years          BP:           167/95 mmHg Patient Gender: M                 HR:           86 bpm. [facility] Procedure: 2D Echo, Cardiac Doppler, Color Doppler and Intracardiac            Opacification Agent Indications:    R94.31 Abnormal EKG  History:        Patient has no prior history of Echocardiogram examinations.                 Signs/Symptoms:Shortness of Breath, Dyspnea and Chest Pain; Risk                 Factors:Diabetes, Dyslipidemia and Hypertension.  Sonographer:    Sheralyn Boatman RDCS Referring Phys: [phone] [facility]  Sonographer Comments: Technically difficult study due to poor echo windows, suboptimal parasternal window, suboptimal apical window, suboptimal subcostal window and patient is obese. Image acquisition challenging due to patient body habitus. IMPRESSIONS  1. Left ventricular ejection fraction, by estimation, is 60 to 65%. The left ventricle has normal function. The left ventricle has no regional wall motion abnormalities. There is severe concentric left ventricular hypertrophy. Left ventricular diastolic  parameters are consistent with Grade I diastolic dysfunction (impaired relaxation).  2. Right ventricular  systolic function is normal. The right ventricular size is mildly enlarged.  3. The mitral valve is normal in structure. No evidence of mitral valve regurgitation. No evidence of mitral stenosis.  4. The aortic valve was not well visualized. Aortic valve regurgitation is not visualized. No aortic stenosis is present.  5. Aortic dilatation noted. There is dilatation of the aortic root. Comparison(s): No prior Echocardiogram. FINDINGS  Left  Ventricle: Left ventricular ejection fraction, by estimation, is 60 to 65%. The left ventricle has normal function. The left ventricle has no regional wall motion abnormalities. Definity contrast agent was given IV to delineate the left ventricular  endocardial borders. The left ventricular internal cavity size was normal in size. There is severe concentric left ventricular hypertrophy. Left ventricular diastolic parameters are consistent with Grade I diastolic dysfunction (impaired relaxation). Right Ventricle: The right ventricular size is mildly enlarged. Right vetricular wall thickness was not well visualized. Right ventricular systolic function is normal. Left Atrium: Left atrial size was normal in size. Right Atrium: Right atrial size was normal in size. Pericardium: Trivial pericardial effusion is present. The pericardial effusion is anterior to the right ventricle. Presence of epicardial fat layer. Mitral Valve: The mitral valve is normal in structure. No evidence of mitral valve regurgitation. No evidence of mitral valve stenosis. Tricuspid Valve: The tricuspid valve is not well visualized. Tricuspid valve regurgitation is not demonstrated. No evidence of tricuspid stenosis. Aortic Valve: The aortic valve was not well visualized. Aortic valve regurgitation is not visualized. No aortic stenosis is present. Pulmonic Valve: The pulmonic valve was normal in structure. Pulmonic valve regurgitation is not visualized. No evidence of pulmonic stenosis. Aorta: The aortic root and ascending aorta are structurally normal, with no evidence of dilitation and aortic dilatation noted. There is dilatation of the aortic root. IAS/Shunts: No atrial level shunt detected by color flow Doppler.  LEFT VENTRICLE PLAX 2D LVIDd:         4.70 cm     Diastology LVIDs:         3.00 cm     LV e' medial:    4.46 cm/s LV PW:         1.60 cm     LV E/e' medial:  18.8 LV IVS:        1.70 cm     LV e' lateral:   7.62 cm/s LVOT diam:      2.40 cm     LV E/e' lateral: 11.0 LV SV:         80 LV SV Index:   32 LVOT Area:     4.52 cm  LV Volumes (MOD) LV vol d, MOD A2C: 83.9 ml LV vol d, MOD A4C: 81.7 ml LV vol s, MOD A2C: 39.2 ml LV vol s, MOD A4C: 25.3 ml LV SV MOD A2C:     44.8 ml LV SV MOD A4C:     81.7 ml LV SV MOD BP:      50.8 ml RIGHT VENTRICLE             IVC RV S prime:     16.50 cm/s  IVC diam: 1.50 cm TAPSE (M-mode): 2.4 cm LEFT ATRIUM             Index        RIGHT ATRIUM           Index LA diam:        3.00 cm 1.20 cm/m   RA Area:  17.10 cm LA Vol (A2C):   36.8 ml 14.69 ml/m  RA Volume:   47.20 ml  18.84 ml/m LA Vol (A4C):   55.9 ml 22.31 ml/m LA Biplane Vol: 46.6 ml 18.60 ml/m  AORTIC VALVE LVOT Vmax:   109.00 cm/s LVOT Vmean:  70.000 cm/s LVOT VTI:    0.176 m  AORTA Ao Root diam: 3.60 cm Ao Asc diam:  3.70 cm MITRAL VALVE MV Area (PHT): 4.06 cm    SHUNTS MV Decel Time: 187 msec    Systemic VTI:  0.18 m MV E velocity: 83.70 cm/s  Systemic Diam: 2.40 cm MV A velocity: 85.90 cm/s MV E/A ratio:  0.97 Riley Lam MD Electronically signed by Riley Lam MD Signature Date/Time: 01/09/2023/2:22:36 PM    Final    CT Angio Chest PE W/Cm &/Or Wo Cm  Result Date: 01/09/2023 CLINICAL DATA:  57 year old male with history of shortness of breath. EXAM: CT ANGIOGRAPHY CHEST WITH CONTRAST TECHNIQUE: Multidetector CT imaging of the chest was performed using the standard protocol during bolus administration of intravenous contrast. Multiplanar CT image reconstructions and MIPs were obtained to evaluate the vascular anatomy. RADIATION DOSE REDUCTION: This exam was performed according to the departmental dose-optimization program which includes automated exposure control, adjustment of the mA and/or kV according to patient size and/or use of iterative reconstruction technique. CONTRAST:  75mL OMNIPAQUE IOHEXOL 350 MG/ML SOLN COMPARISON:  No priors. FINDINGS: Cardiovascular: No filling defect in the pulmonary arterial tree to  suggest pulmonary embolism. Heart size is normal. There is no significant pericardial fluid, thickening or pericardial calcification. There is aortic atherosclerosis, as well as atherosclerosis of the great vessels of the mediastinum and the coronary arteries, including calcified atherosclerotic plaque in the left anterior descending and right coronary arteries. Dilatation of the pulmonic trunk (3.6 cm in diameter), concerning for pulmonary arterial hypertension. Mediastinum/Nodes: No pathologically enlarged mediastinal or hilar lymph nodes. Esophagus is unremarkable in appearance. No axillary lymphadenopathy. Lungs/Pleura: Thickening of the peribronchovascular interstitium throughout the lung bases with surrounding peribronchovascular ground-glass attenuation. These findings are nonspecific, and could reflect areas of chronic post infectious or inflammatory scarring, although sequela of mild aspiration is not excluded. No confluent consolidative airspace disease. No pleural effusions. A few scattered small pulmonary nodules are noted, largest of which measure up to 5 mm in the periphery of the right upper lobe (axial image 60 of series 8). Upper Abdomen: Unremarkable. Musculoskeletal: There are no aggressive appearing lytic or blastic lesions noted in the visualized portions of the skeleton. Review of the MIP images confirms the above findings. IMPRESSION: 1. No evidence of pulmonary embolism. 2. Findings in the lung bases which may simply reflect areas of chronic post infectious or inflammatory scarring, although clinical correlation for signs and symptoms of recent aspiration is also suggested. 3. Dilatation of the pulmonic trunk (3.6 cm in diameter), concerning for pulmonary arterial hypertension. 4. Small pulmonary nodules measuring 5 mm or less in size, nonspecific, but statistically likely benign. No follow-up needed if patient is low-risk (and has no known or suspected primary neoplasm). Non-contrast chest  CT can be considered in 12 months if patient is high-risk. This recommendation follows the consensus statement: Guidelines for Management of Incidental Pulmonary Nodules Detected on CT Images: From the Fleischner Society 2017; Radiology 2017; 284:228-243. 5. Aortic atherosclerosis, in addition to two-vessel coronary artery disease. Please note that although the presence of coronary artery calcium documents the presence of coronary artery disease, the severity of this disease and any potential stenosis cannot  be assessed on this non-gated CT examination. Assessment for potential risk factor modification, dietary therapy or pharmacologic therapy may be warranted, if clinically indicated. Aortic Atherosclerosis (ICD10-I70.0). Electronically Signed   By: Trudie Reed M.D.   On: 01/09/2023 10:30   DG Chest 2 View  Result Date: 01/09/2023 CLINICAL DATA:  57 year old male with history of shortness of breath. EXAM: CHEST - 2 VIEW COMPARISON:  Chest x-ray 01/20/2020. FINDINGS: There is cephalization of the pulmonary vasculature and slight indistinctness of the interstitial markings suggestive of mild pulmonary edema. Trace bilateral pleural effusions. Mild cardiomegaly. The patient is rotated to the right on today's exam, resulting in distortion of the mediastinal contours and reduced diagnostic sensitivity and specificity for mediastinal pathology. IMPRESSION: 1. The appearance of the chest suggests mild congestive heart failure, as above. Electronically Signed   By: Trudie Reed M.D.   On: 01/09/2023 09:12    Micro Results   Recent Results (from the past 240 hour(s))  SARS Coronavirus 2 by RT PCR (hospital order, performed in [facility] hospital lab) *cepheid single result test* Anterior Nasal Swab     Status: None   Collection Time: 01/09/23  1:10 PM   Specimen: Anterior Nasal Swab  Result Value Ref Range Status   SARS Coronavirus 2 by RT PCR NEGATIVE NEGATIVE Final    Comment: (NOTE) SARS-CoV-2  target nucleic acids are NOT DETECTED.  The SARS-CoV-2 RNA is generally detectable in upper and lower respiratory specimens during the acute phase of infection. The lowest concentration of SARS-CoV-2 viral copies this assay can detect is 250 copies / mL. A negative result does not preclude SARS-CoV-2 infection and should not be used as the sole basis for treatment or other patient management decisions.  A negative result may occur with improper specimen collection / handling, submission of specimen other than nasopharyngeal swab, presence of viral mutation(s) within the areas targeted by this assay, and inadequate number of viral copies (<250 copies / mL). A negative result must be combined with clinical observations, patient history, and epidemiological information.  Fact Sheet for Patients:   RoadLapTop.co.za  Fact Sheet for Healthcare Providers: http://kim-miller.com/  This test is not yet approved or  cleared by the [place] FDA and has been authorized for detection and/or diagnosis of SARS-CoV-2 by FDA under an Emergency Use Authorization (EUA).  This EUA will remain in effect (meaning this test can be used) for the duration of the COVID-19 declaration under Section 564(b)(1) of the Act, 21 U.S.C. section 360bbb-3(b)(1), unless the authorization is terminated or revoked sooner.  Performed at [facility], [address], [place], [place] [address]    Today   Subjective    Makaih Tatge today has no new complaints       - Pt ambulated entire length of hallway for room 306 to nurses station and back with only slight SOB, SaO2 stayed 92% - 93% entire time of ambulation. HR maxed at 106 ST.    Post Ambulation Vital Signs: B/P: 121/80 HR: 93 Resp: 20 SaO2: 93% RA -No chest pains no palpitations no dizziness   Patient has been seen and examined prior to discharge   Objective   Blood pressure 121/80, pulse 93,  temperature 98.6 F (37 C), resp. rate 20, height 5\' 11"  (1.803 m), weight (!) 136.4 kg, SpO2 93%.   Intake/Output Summary (Last 24 hours) at 01/11/2023 1521 Last data filed at 01/11/2023 0854 Gross per 24 hour  Intake 243 ml  Output 700 ml  Net -457  ml   Exam Gen:- Awake Alert, no acute distress,speaking in full sentences, morbidly obese HEENT:- [place].AT, No sclera icterus Neck-Supple Neck,No JVD,.  Lungs-improved air movement, no rales, no wheezing, no rhonchi CV- S1, S2 normal, regular Abd-  +ve B.Sounds, Abd Soft, No tenderness,    Extremity/Skin:-Resolved edema,   good pulses Psych-affect is appropriate, oriented x3 Neuro-no new focal deficits, no tremors   Data Review   CBC w Diff:  Lab Results  Component Value Date   WBC 11.3 (H) 01/11/2023   HGB 13.7 01/11/2023   HGB 14.2 03/22/2017   HCT 43.7 01/11/2023   HCT 41.6 03/22/2017   PLT 335 01/11/2023   PLT 483 (H) 03/22/2017   LYMPHOPCT 13 01/09/2023   MONOPCT 10 01/09/2023   EOSPCT 1 01/09/2023   BASOPCT 0 01/09/2023   CMP:  Lab Results  Component Value Date   NA 136 01/11/2023   NA 145 (H) 07/30/2022   K 3.9 01/11/2023   CL 100 01/11/2023   CO2 27 01/11/2023   BUN 14 01/11/2023   BUN 13 07/30/2022   CREATININE 0.87 01/11/2023   CREATININE 0.93 10/12/2019   PROT 7.5 07/30/2022   ALBUMIN 3.8 01/11/2023   ALBUMIN 4.5 07/30/2022   BILITOT 0.5 07/30/2022   ALKPHOS 83 07/30/2022   AST 29 07/30/2022   ALT 36 07/30/2022   Total Discharge time is about 33 minutes  Shon Hale M.D on 01/11/2023 at 3:21 PM  Go to [email or web address] -  for contact info  [facility] - Office  [phone]

## 2023-01-15 DIAGNOSIS — I2510 Atherosclerotic heart disease of native coronary artery without angina pectoris: Secondary | ICD-10-CM | POA: Diagnosis not present

## 2023-01-15 DIAGNOSIS — I5030 Unspecified diastolic (congestive) heart failure: Secondary | ICD-10-CM | POA: Diagnosis not present

## 2023-01-15 DIAGNOSIS — Z6841 Body Mass Index (BMI) 40.0 and over, adult: Secondary | ICD-10-CM | POA: Diagnosis not present

## 2023-01-15 DIAGNOSIS — G4733 Obstructive sleep apnea (adult) (pediatric): Secondary | ICD-10-CM | POA: Diagnosis not present

## 2023-02-05 ENCOUNTER — Other Ambulatory Visit: Payer: Self-pay | Admitting: "Endocrinology

## 2023-02-12 ENCOUNTER — Other Ambulatory Visit: Payer: Self-pay | Admitting: "Endocrinology

## 2023-02-18 DIAGNOSIS — E119 Type 2 diabetes mellitus without complications: Secondary | ICD-10-CM | POA: Diagnosis not present

## 2023-02-18 DIAGNOSIS — H40012 Open angle with borderline findings, low risk, left eye: Secondary | ICD-10-CM | POA: Diagnosis not present

## 2023-03-16 DIAGNOSIS — G4733 Obstructive sleep apnea (adult) (pediatric): Secondary | ICD-10-CM | POA: Diagnosis not present

## 2023-03-22 ENCOUNTER — Other Ambulatory Visit: Payer: Self-pay

## 2023-03-22 ENCOUNTER — Other Ambulatory Visit: Payer: Self-pay | Admitting: "Endocrinology

## 2023-03-22 MED ORDER — FREESTYLE LIBRE 3 SENSOR MISC: 1 refills | Status: DC

## 2023-04-09 DIAGNOSIS — G4733 Obstructive sleep apnea (adult) (pediatric): Secondary | ICD-10-CM | POA: Diagnosis not present

## 2023-04-15 ENCOUNTER — Other Ambulatory Visit: Payer: Self-pay | Admitting: "Endocrinology

## 2023-04-15 DIAGNOSIS — E1159 Type 2 diabetes mellitus with other circulatory complications: Secondary | ICD-10-CM | POA: Diagnosis not present

## 2023-04-15 DIAGNOSIS — E538 Deficiency of other specified B group vitamins: Secondary | ICD-10-CM

## 2023-04-16 LAB — COMPLETE METABOLIC PANEL WITH GFR
AG Ratio: 1.4 (calc) (ref 1.0–2.5)
ALT: 22 U/L (ref 9–46)
AST: 18 U/L (ref 10–35)
Albumin: 4.3 g/dL (ref 3.6–5.1)
Alkaline phosphatase (APISO): 64 U/L (ref 35–144)
BUN: 16 mg/dL (ref 7–25)
CO2: 31 mmol/L (ref 20–32)
Calcium: 9.5 mg/dL (ref 8.6–10.3)
Chloride: 104 mmol/L (ref 98–110)
Creat: 0.99 mg/dL (ref 0.70–1.30)
Globulin: 3 g/dL (ref 1.9–3.7)
Glucose, Bld: 89 mg/dL (ref 65–99)
Potassium: 4.2 mmol/L (ref 3.5–5.3)
Sodium: 140 mmol/L (ref 135–146)
Total Bilirubin: 0.5 mg/dL (ref 0.2–1.2)
Total Protein: 7.3 g/dL (ref 6.1–8.1)
eGFR: 90 mL/min/{1.73_m2} (ref >=60)

## 2023-04-16 LAB — HEMOGLOBIN A1C
Hgb A1c MFr Bld: 7.5 %{Hb} — ABNORMAL HIGH (ref <5.7)
Mean Plasma Glucose: 169 mg/dL
eAG (mmol/L): 9.3 mmol/L

## 2023-04-16 LAB — VITAMIN B12: Vitamin B-12: 1174 pg/mL — ABNORMAL HIGH (ref 200–1100)

## 2023-04-20 DIAGNOSIS — I5030 Unspecified diastolic (congestive) heart failure: Secondary | ICD-10-CM | POA: Diagnosis not present

## 2023-04-20 DIAGNOSIS — E1165 Type 2 diabetes mellitus with hyperglycemia: Secondary | ICD-10-CM | POA: Diagnosis not present

## 2023-04-20 DIAGNOSIS — Z0001 Encounter for general adult medical examination with abnormal findings: Secondary | ICD-10-CM | POA: Diagnosis not present

## 2023-04-20 DIAGNOSIS — Z1331 Encounter for screening for depression: Secondary | ICD-10-CM | POA: Diagnosis not present

## 2023-04-20 DIAGNOSIS — E782 Mixed hyperlipidemia: Secondary | ICD-10-CM | POA: Diagnosis not present

## 2023-04-20 DIAGNOSIS — I2510 Atherosclerotic heart disease of native coronary artery without angina pectoris: Secondary | ICD-10-CM | POA: Diagnosis not present

## 2023-04-20 DIAGNOSIS — Z6841 Body Mass Index (BMI) 40.0 and over, adult: Secondary | ICD-10-CM | POA: Diagnosis not present

## 2023-04-20 DIAGNOSIS — G4733 Obstructive sleep apnea (adult) (pediatric): Secondary | ICD-10-CM | POA: Diagnosis not present

## 2023-04-23 ENCOUNTER — Other Ambulatory Visit: Payer: Self-pay | Admitting: "Endocrinology

## 2023-04-26 ENCOUNTER — Encounter: Payer: Self-pay | Admitting: "Endocrinology

## 2023-04-26 ENCOUNTER — Ambulatory Visit (INDEPENDENT_AMBULATORY_CARE_PROVIDER_SITE_OTHER): Payer: BC Managed Care – PPO | Admitting: "Endocrinology

## 2023-04-26 VITALS — BP 126/86 | HR 76 | Ht 71.0 in | Wt 308.2 lb

## 2023-04-26 DIAGNOSIS — I10 Essential (primary) hypertension: Secondary | ICD-10-CM

## 2023-04-26 DIAGNOSIS — E1159 Type 2 diabetes mellitus with other circulatory complications: Secondary | ICD-10-CM | POA: Diagnosis not present

## 2023-04-26 DIAGNOSIS — Z794 Long term (current) use of insulin: Secondary | ICD-10-CM

## 2023-04-26 DIAGNOSIS — E538 Deficiency of other specified B group vitamins: Secondary | ICD-10-CM | POA: Diagnosis not present

## 2023-04-26 DIAGNOSIS — E782 Mixed hyperlipidemia: Secondary | ICD-10-CM | POA: Diagnosis not present

## 2023-04-26 DIAGNOSIS — E559 Vitamin D deficiency, unspecified: Secondary | ICD-10-CM

## 2023-04-26 MED ORDER — TOUJEO SOLOSTAR 300 UNIT/ML ~~LOC~~ SOPN: 80.0000 [IU] | PEN_INJECTOR | Freq: Every day | SUBCUTANEOUS | 1 refills | Status: DC

## 2023-04-26 MED ORDER — METFORMIN HCL ER 500 MG PO TB24: 500.0000 mg | ORAL_TABLET | Freq: Every day | ORAL | 1 refills | Status: DC

## 2023-04-26 NOTE — Progress Notes (Signed)
04/26/2023   Endocrinology follow-up note   Subjective:    [name], male    DOB: 05-23-1966.  he is being seen in follow-up after he was seen in consultation for  management of currently uncontrolled symptomatic uncontrolled type 2 diabetes, hyperlipidemia, hypertension.  PMD:   Elfredia Nevins, MD.   Past Medical History:  Diagnosis Date   CAD (coronary artery disease), native coronary artery    10/18 PCI/DES mLAD, normal EF on LV gram   Diabetes mellitus    Type 2 diagnosed 2 weeks ago   Hiatal hernia with gastroesophageal reflux    Hypertension    Mixed hyperlipidemia    Morbid obesity (HCC)    OSA on CPAP    Urinary frequency    Past Surgical History:  Procedure Laterality Date   CIRCUMCISION     1988   CORONARY STENT INTERVENTION N/A 03/16/2017   Procedure: CORONARY STENT INTERVENTION;  Surgeon: Lyn Records, MD;  Location: [facility];  Service: Cardiovascular;  Laterality: N/A;   ESOPHAGEAL DILATION N/A 02/08/2015   Procedure: ESOPHAGEAL DILATION;  Surgeon: Malissa Hippo, MD;  Location: [facility];  Service: Endoscopy;  Laterality: N/A;   ESOPHAGOGASTRODUODENOSCOPY N/A 02/08/2015   Procedure: ESOPHAGOGASTRODUODENOSCOPY (EGD);  Surgeon: Malissa Hippo, MD;  Location: [facility];  Service: Endoscopy;  Laterality: N/A;  1055   ESOPHAGOGASTRODUODENOSCOPY (EGD) WITH ESOPHAGEAL DILATION  03/29/2012   Procedure: ESOPHAGOGASTRODUODENOSCOPY (EGD) WITH ESOPHAGEAL DILATION;  Surgeon: Malissa Hippo, MD;  Location: [facility];  Service: Endoscopy;  Laterality: N/A;  730   LEFT HEART CATH AND CORONARY ANGIOGRAPHY N/A 03/16/2017   Procedure: LEFT HEART CATH AND CORONARY ANGIOGRAPHY;  Surgeon: Lyn Records, MD;  Location: [facility];  Service: Cardiovascular;  Laterality: N/A;   ULTRASOUND GUIDANCE FOR VASCULAR ACCESS  03/16/2017   Procedure: Ultrasound Guidance For Vascular Access;  Surgeon: Lyn Records, MD;  Location: [facility]  INVASIVE CV LAB;  Service: Cardiovascular;;   Social History   Socioeconomic History   Marital status: Married    Spouse name: Not on file   Number of children: Not on file   Years of education: Not on file   Highest education level: Not on file  Occupational History   Not on file  Tobacco Use   Smoking status: Never   Smokeless tobacco: Never  Vaping Use   Vaping status: Never Used  Substance and Sexual Activity   Alcohol use: No   Drug use: No   Sexual activity: Not on file  Other Topics Concern   Not on file  Social History Narrative   Drinks caffeine 4 times a week.      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   Social Determinants of Health   Financial Resource Strain: Low Risk  (01/16/2022)   Overall Financial Resource Strain (CARDIA)    Difficulty of Paying Living Expenses: Not hard at all  Food Insecurity: No Food Insecurity (01/09/2023)   Hunger Vital Sign    Worried About Running Out of Food in the Last Year: Never true    Ran Out of Food in the Last Year: Never true  Transportation Needs: No Transportation Needs (01/09/2023)   PRAPARE - Administrator, Civil Service (Medical): No    Lack of Transportation (Non-Medical): No  Physical Activity: Not on file  Stress: Not on file  Social Connections: Not on file   Outpatient Encounter Medications as of 04/26/2023  Medication Sig  aspirin EC 81 MG tablet Take 1 tablet (81 mg total) by mouth daily with breakfast.   atorvastatin (LIPITOR) 40 MG tablet Take 1 tablet (40 mg total) by mouth daily.   carvedilol (COREG) 3.125 MG tablet Take 1 tablet (3.125 mg total) by mouth 2 (two) times daily. For BP   Cholecalciferol (VITAMIN D3) 125 MCG (5000 UT) CAPS Take 1 capsule (5,000 Units total) by mouth daily.   Continuous Blood Gluc Receiver (FREESTYLE LIBRE 14 DAY READER) DEVI Use to check blood glucose 4 times daily.   Continuous Glucose Sensor (FREESTYLE LIBRE 3 PLUS SENSOR) MISC Change sensor every 15 days. E11.65    dapagliflozin propanediol (FARXIGA) 10 MG TABS tablet Take 1 tablet (10 mg total) by mouth daily before breakfast.   dextromethorphan-guaiFENesin (MUCINEX DM) 30-600 MG 12hr tablet Take 1 tablet by mouth 2 (two) times daily.   furosemide (LASIX) 20 MG tablet Take 1 tablet (20 mg total) by mouth daily.   insulin glargine, 1 Unit Dial, (TOUJEO SOLOSTAR) 300 UNIT/ML Solostar Pen Inject 80 Units into the skin at bedtime.   Insulin Pen Needle (B-D ULTRAFINE III SHORT PEN) 31G X 8 MM MISC USE 1 PEN NEEDLE DAILY AS DIRECTED   lisinopril (ZESTRIL) 20 MG tablet Take 1 tablet (20 mg total) by mouth daily.   Melatonin 2.5 MG CAPS Take 2.5 mg by mouth at bedtime as needed (sleep).   metFORMIN (GLUCOPHAGE-XR) 500 MG 24 hr tablet Take 1 tablet (500 mg total) by mouth daily with breakfast.   nitroGLYCERIN (NITROSTAT) 0.4 MG SL tablet Place 1 tablet (0.4 mg total) under the tongue every 5 (five) minutes as needed.   potassium chloride (KLOR-CON) 10 MEQ tablet Take 1 tablet (10 mEq total) by mouth daily. Take While taking Lasix/furosemide   sildenafil (VIAGRA) 100 MG tablet Take 100 mg by mouth as needed.   [DISCONTINUED] insulin glargine, 1 Unit Dial, (TOUJEO SOLOSTAR) 300 UNIT/ML Solostar Pen Inject 80 Units into the skin at bedtime.   [DISCONTINUED] metFORMIN (GLUCOPHAGE-XR) 500 MG 24 hr tablet Take 1 tablet (500 mg total) by mouth 2 (two) times daily with a meal. (Patient taking differently: Take 500 mg by mouth daily with breakfast.)   No facility-administered encounter medications on file as of 04/26/2023.    ALLERGIES: Allergies  Allergen Reactions   Bee Venom    Shellfish Allergy Swelling    Turns red   Strawberry Extract Rash    VACCINATION STATUS: Immunization History  Administered Date(s) Administered   Moderna Sars-Covid-2 Vaccination 08/24/2019, 09/27/2019   PNEUMOCOCCAL CONJUGATE-20 01/11/2023    Diabetes He presents for his follow-up diabetic visit. He has type 2 diabetes mellitus.  Onset time: He reports that he was diagnosed at approximate age of 57 years. His disease course has been fluctuating. There are no hypoglycemic associated symptoms. Pertinent negatives for hypoglycemia include no confusion, headaches, pallor, seizures or tremors. Pertinent negatives for diabetes include no blurred vision, no chest pain, no fatigue, no polydipsia, no polyphagia, no polyuria and no weakness. There are no hypoglycemic complications. Symptoms are stable. Diabetic complications include heart disease. Risk factors for coronary artery disease include dyslipidemia, diabetes mellitus, family history, hypertension, male sex, obesity and sedentary lifestyle. Current diabetic treatment includes insulin injections (Is currently on Toujeo 60 units nightly, Janumet 50/1000 mg p.o. daily, glipizide 5 g p.o. daily.). His weight is increasing steadily (Overall achieved 38 pounds weight loss.). He is following a generally unhealthy diet. When asked about meal planning, he reported none. He has not had  a previous visit with a dietitian. He participates in exercise intermittently. His home blood glucose trend is fluctuating minimally. His breakfast blood glucose range is generally 130-140 mg/dl. His lunch blood glucose range is generally 130-140 mg/dl. His dinner blood glucose range is generally 130-140 mg/dl. His bedtime blood glucose range is generally 130-140 mg/dl. His overall blood glucose range is 130-140 mg/dl. Joel Ward returns with recently improving glycemic profile.  His AGP shows 87% time range, 12% level 1 hyperglycemia.  His average blood glucose is 130 mg per DL, however most recent measured A1c was 7.5%.   ) An ACE inhibitor/angiotensin II receptor blocker is being taken. He does not see a podiatrist.Eye exam is not current.  Hyperlipidemia This is a chronic problem. The current episode started more than 1 year ago. The problem is controlled. Exacerbating diseases include diabetes and obesity.  Pertinent negatives include no chest pain, myalgias or shortness of breath. Current antihyperlipidemic treatment includes statins. Risk factors for coronary artery disease include dyslipidemia, diabetes mellitus, hypertension, male sex, obesity, a sedentary lifestyle and family history.  Hypertension This is a chronic problem. The problem is controlled. Pertinent negatives include no blurred vision, chest pain, headaches, neck pain, palpitations or shortness of breath. Risk factors for coronary artery disease include diabetes mellitus, dyslipidemia, obesity, male gender, sedentary lifestyle and family history. Past treatments include ACE inhibitors. Hypertensive end-organ damage includes CAD/MI.     Review of systems Limited as above.   Objective:    BP 126/86   Pulse 76   Ht 5\' 11"  (1.803 m)   Wt (!) 308 lb 3.2 oz (139.8 kg)   BMI 42.99 kg/m   Wt Readings from Last 3 Encounters:  04/26/23 (!) 308 lb 3.2 oz (139.8 kg)  01/11/23 (!) 300 lb 11.2 oz (136.4 kg)  12/10/22 (!) 302 lb 3.2 oz (137.1 kg)      Physical Exam- Limited  Constitutional:  Body mass index is 42.99 kg/m. , not in acute distress, normal state of mind   CMP ( most recent) CMP     Component Value Date/Time   NA 140 04/15/2023 1020   NA 145 (H) 07/30/2022 0816   K 4.2 04/15/2023 1020   CL 104 04/15/2023 1020   CO2 31 04/15/2023 1020   GLUCOSE 89 04/15/2023 1020   BUN 16 04/15/2023 1020   BUN 13 07/30/2022 0816   CREATININE 0.99 04/15/2023 1020   CALCIUM 9.5 04/15/2023 1020   PROT 7.3 04/15/2023 1020   PROT 7.5 07/30/2022 0816   ALBUMIN 3.8 01/11/2023 0344   ALBUMIN 4.5 07/30/2022 0816   AST 18 04/15/2023 1020   ALT 22 04/15/2023 1020   ALKPHOS 83 07/30/2022 0816   BILITOT 0.5 04/15/2023 1020   BILITOT 0.5 07/30/2022 0816   GFRNONAA >60 01/11/2023 0344   GFRNONAA 95 10/12/2019 0833   GFRAA >60 01/20/2020 0556   GFRAA 110 10/12/2019 0833     Diabetic Labs (most recent): Lab Results  Component  Value Date   HGBA1C 7.5 (H) 04/15/2023   HGBA1C 7.1 (A) 12/10/2022   HGBA1C 7.1 (A) 08/04/2022   MICROALBUR 1.8 10/12/2019   MICROALBUR 0.5 03/01/2018     Lipid Panel ( most recent) Lipid Panel     Component Value Date/Time   CHOL 131 07/30/2022 0816   TRIG 94 07/30/2022 0816   HDL 51 07/30/2022 0816   CHOLHDL 2.6 07/30/2022 0816   CHOLHDL 3.0 10/12/2019 0833   VLDL 21 06/29/2017 0807   LDLCALC 62 07/30/2022 0816  LDLCALC 64 10/12/2019 0833     Assessment & Plan:   1. DM type 2 causing vascular disease (HCC) - Patient has currently uncontrolled symptomatic type 2 DM since  57 years of age.  Joel Ward returns with recently improving glycemic profile.  His AGP shows 87% time range, 12% level 1 hyperglycemia.  His average blood glucose is 130 mg per DL, however most recent measured A1c was 7.5%.     Recent labs reviewed with him showing normal renal function.  -his diabetes is complicated by coronary artery disease with stent placement on 03/15/2017 and Joel Ward remains at extremely  high risk for more acute and chronic complications which include CHF, obstructive sleep apnea, CAD, CVA, CKD, retinopathy, and neuropathy. These are all discussed in detail with the patient.  - I have counseled him on diet management and weight loss, by adopting a carbohydrate restricted/protein rich diet. -He did admits to dietary indiscretions including consumption of sweets and sweetened beverages.     He is advised to reengage with lifestyle medicine.   - he acknowledges that there is a room for improvement in his food and drink choices. - Suggestion is made for him to avoid simple carbohydrates  from his diet including Cakes, Sweet Desserts, Ice Cream, Soda (diet and regular), Sweet Tea, Candies, Chips, Cookies, Store Bought Juices, Alcohol , Artificial Sweeteners,  Coffee Creamer, and "Sugar-free" Products, Lemonade. This will help patient to have more stable blood glucose profile and  potentially avoid unintended weight gain.  The following Lifestyle Medicine recommendations according to American College of Lifestyle Medicine  [facility]) were discussed and and offered to patient and he  agrees to start the journey:  A. Whole Foods, Plant-Based Nutrition comprising of fruits and vegetables, plant-based proteins, whole-grain carbohydrates was discussed in detail with the patient.   A list for source of those nutrients were also provided to the patient.  Patient will use only water or unsweetened tea for hydration. B.  The need to stay away from risky substances including alcohol, smoking; obtaining 7 to 9 hours of restorative sleep, at least 150 minutes of moderate intensity exercise weekly, the importance of healthy social connections,  and stress management techniques were discussed. C.  A full color page of  Calorie density of various food groups per pound showing examples of each food groups was provided to the patient.   - I encouraged him to switch to  unprocessed or minimally processed complex starch and increased protein intake (animal or plant source), fruits, and vegetables.  - he is advised to stick to a routine mealtimes to eat 3 meals  a day and avoid unnecessary snacks ( to snack only to correct hypoglycemia).   - he has been  scheduled with Norm Salt, RDN, CDE for individualized DM education- consult pending.  - I have approached him with the following individualized plan to manage diabetes and patient agrees:   -In light of his presentation with recently tightening glycemic profile, he would not be considered for prandial insulin for now.  He is advised to continue Toujeo 80 units nightly, associated with utilizing his CGM continuously.   -He is benefiting from [place], advised to continue Farxiga 10 mg p.o. daily at breakfast along with metformin 500 mg XR p.o. daily at breakfast.   -He is encouraged to call clinic for hypoglycemia below 70 and hyperglycemia  above 200 mg/dL.  2) BP/HTN: -His blood pressure is controlled to target.  He is advised to continue  lisinopril to 20 mg p.o. daily at breakfast.    3) Lipids/HPL: His LDL is improving to 62.   This is mainly due to his recent change in diet.  He is advised to continue atorvastatin 40 mg p.o. nightly.  He will have fasting lipid panel before his next visit.     4)  Weight/Diet: His current BMI is 42.99- He is still a candidate for modest weight loss.  I discussed with him the fact that he would benefit the most from loss of 5 - 10% of his current body weight .  He is benefiting from whole food plant-based diet.     5) vitamin D deficiency:  He is on ongoing supplement with vitamin D 2 50,000 units weekly.  He will have repeat 25-hydroxy vitamin D measured before his next visit.   His labs still show supraphysiologic level of vitamin B12.  He is advised to stay off of vitamin B12 supplements  6) Chronic Care/Health Maintenance:  -he  is on ACEI/ARB and Statin medications and  is encouraged to continue to follow up with Ophthalmology, Dentist,  Podiatrist at least yearly or according to recommendations, and advised to  stay away from smoking. I have recommended yearly flu vaccine and pneumonia vaccination at least every 5 years; moderate intensity exercise for up to 150 minutes weekly; and  sleep for at least 7 hours a day.  He had negative ABI for PAD in April 2022.  His study will be repeated in April 2027, or sooner if needed.   - I advised patient to maintain close follow up with Elfredia Nevins, MD for primary care needs.   I spent  41  minutes in the care of the patient today including review of labs from CMP, Lipids, Thyroid Function, Hematology (current and previous including abstractions from other facilities); face-to-face time discussing  his blood glucose readings/logs, discussing hypoglycemia and hyperglycemia episodes and symptoms, medications doses, his options of short and  long term treatment based on the latest standards of care / guidelines;  discussion about incorporating lifestyle medicine;  and documenting the encounter. Risk reduction counseling performed per USPSTF guidelines to reduce  obesity and cardiovascular risk factors.     Please refer to Patient Instructions for Blood Glucose Monitoring and Insulin/Medications Dosing Guide"  in media tab for additional information. Please  also refer to " Patient Self Inventory" in the Media  tab for reviewed elements of pertinent patient history.  Joel Ward participated in the discussions, expressed understanding, and voiced agreement with the above plans.  All questions were answered to his satisfaction. he is encouraged to contact clinic should he have any questions or concerns prior to his return visit.    Follow up plan: - Return in about 4 months (around 08/24/2023) for Bring Meter/CGM Device/Logs- A1c in Office.  Marquis Lunch, MD Phone: [phone]  Fax: [phone]   04/26/2023, 10:22 AM This note was partially dictated with voice recognition software. Similar sounding words can be transcribed inadequately or may not  be corrected upon review.

## 2023-04-26 NOTE — Patient Instructions (Signed)
                                     Advice for Weight Management  -For most of us the best way to lose weight is by diet management. Generally speaking, diet management means consuming less calories intentionally which over time brings about progressive weight loss.  This can be achieved more effectively by avoiding ultra processed carbohydrates, processed meats, unhealthy fats.    It is critically important to know your numbers: how much calorie you are consuming and how much calorie you need. More importantly, our carbohydrates sources should be unprocessed naturally occurring  complex starch food items.  It is always important to balance nutrition also by  appropriate intake of proteins (mainly plant-based), healthy fats/oils, plenty of fruits and vegetables.   -The American College of Lifestyle Medicine (ACL M) recommends nutrition derived mostly from Whole Food, Plant Predominant Sources example an apple instead of applesauce or apple pie. Eat Plenty of vegetables, Mushrooms, fruits, Legumes, Whole Grains, Nuts, seeds in lieu of processed meats, processed snacks/pastries red meat, poultry, eggs.  Use only water or unsweetened tea for hydration.  The College also recommends the need to stay away from risky substances including alcohol, smoking; obtaining 7-9 hours of restorative sleep, at least 150 minutes of moderate intensity exercise weekly, importance of healthy social connections, and being mindful of stress and seek help when it is overwhelming.    -Sticking to a routine mealtime to eat 3 meals a day and avoiding unnecessary snacks is shown to have a big role in weight control. Under normal circumstances, the only time we burn stored energy is when we are hungry, so allow  some hunger to take place- hunger means no food between appropriate meal times, only water.  It is not advisable to starve.   -It is better to avoid simple carbohydrates including:  Cakes, Sweet Desserts, Ice Cream, Soda (diet and regular), Sweet Tea, Candies, Chips, Cookies, Store Bought Juices, Alcohol in Excess of  1-2 drinks a day, Lemonade,  Artificial Sweeteners, Doughnuts, Coffee Creamers, "Sugar-free" Products, etc, etc.  This is not a complete list.....    -Consulting with certified diabetes educators is proven to provide you with the most accurate and current information on diet.  Also, you may be  interested in discussing diet options/exchanges , we can schedule a visit with Joel Ward, RDN, CDE for individualized nutrition education.  -Exercise: If you are able: 30 -60 minutes a day ,4 days a week, or 150 minutes of moderate intensity exercise weekly.    The longer the better if tolerated.  Combine stretch, strength, and aerobic activities.  If you were told in the past that you have high risk for cardiovascular diseases, or if you are currently symptomatic, you may seek evaluation by your heart doctor prior to initiating moderate to intense exercise programs.                                  Additional Care Considerations for Diabetes/Prediabetes   -Diabetes  is a chronic disease.  The most important care consideration is regular follow-up with your diabetes care provider with the goal being avoiding or delaying its complications and to take advantage of advances in medications and technology.  If appropriate actions are taken early enough, type 2 diabetes can even be   reversed.  Seek information from the right source.  - Whole Food, Plant Predominant Nutrition is highly recommended: Eat Plenty of vegetables, Mushrooms, fruits, Legumes, Whole Grains, Nuts, seeds in lieu of processed meats, processed snacks/pastries red meat, poultry, eggs as recommended by American College of  Lifestyle Medicine (ACLM).  -Type 2 diabetes is known to coexist with other important comorbidities such as high blood pressure and high cholesterol.  It is critical to control not only the  diabetes but also the high blood pressure and high cholesterol to minimize and delay the risk of complications including coronary artery disease, stroke, amputations, blindness, etc.  The good news is that this diet recommendation for type 2 diabetes is also very helpful for managing high cholesterol and high blood blood pressure.  - Studies showed that people with diabetes will benefit from a class of medications known as ACE inhibitors and statins.  Unless there are specific reasons not to be on these medications, the standard of care is to consider getting one from these groups of medications at an optimal doses.  These medications are generally considered safe and proven to help protect the heart and the kidneys.    - People with diabetes are encouraged to initiate and maintain regular follow-up with eye doctors, foot doctors, dentists , and if necessary heart and kidney doctors.     - It is highly recommended that people with diabetes quit smoking or stay away from smoking, and get yearly  flu vaccine and pneumonia vaccine at least every 5 years.  See above for additional recommendations on exercise, sleep, stress management , and healthy social connections.      

## 2023-05-09 DIAGNOSIS — G4733 Obstructive sleep apnea (adult) (pediatric): Secondary | ICD-10-CM | POA: Diagnosis not present

## 2023-06-09 DIAGNOSIS — G4733 Obstructive sleep apnea (adult) (pediatric): Secondary | ICD-10-CM | POA: Diagnosis not present

## 2023-06-17 ENCOUNTER — Other Ambulatory Visit: Payer: Self-pay

## 2023-06-17 MED ORDER — METFORMIN HCL ER 500 MG PO TB24: 500.0000 mg | ORAL_TABLET | Freq: Every day | ORAL | 1 refills | Status: DC

## 2023-06-30 ENCOUNTER — Telehealth (HOSPITAL_BASED_OUTPATIENT_CLINIC_OR_DEPARTMENT_OTHER): Payer: Self-pay | Admitting: *Deleted

## 2023-06-30 DIAGNOSIS — R1310 Dysphagia, unspecified: Secondary | ICD-10-CM | POA: Diagnosis not present

## 2023-06-30 DIAGNOSIS — I509 Heart failure, unspecified: Secondary | ICD-10-CM | POA: Diagnosis not present

## 2023-06-30 NOTE — Telephone Encounter (Signed)
   Name: Joel Ward  DOB: Dec 30, 1965  MRN: [id number]  Primary Cardiologist: None- Patient will need to see cardiologist to re-establish care as he has not been seen since 08/2019 by Dr. Eden Emms.   Chart reviewed as part of pre-operative protocol coverage. Because of [name]'s past medical history and time since last visit, he will require a follow-up in-office visit in order to better assess preoperative cardiovascular risk.  Pre-op covering staff: - Please schedule appointment and call patient to inform them. If patient already had an upcoming appointment within acceptable timeframe, please add "pre-op clearance" to the appointment notes so provider is aware. - Please contact requesting surgeon's office via preferred method (i.e, phone, fax) to inform them of need for appointment prior to surgery.  Rip Harbour, NP  06/30/2023, 4:41 PM

## 2023-06-30 NOTE — Telephone Encounter (Signed)
   Pre-operative Risk Assessment    Patient Name: Joel Ward  DOB: August 05, 1965 MRN: [id number]   Date of last office visit: 09/01/2019 Date of next office visit: None   Request for Surgical Clearance    Procedure:   Endoscopy/Colonoscopy   Date of Surgery:  Clearance TBD                                 Surgeon:  Dr. Kathi Der Surgeon's Group or Practice Name:  [facility] Phone number:  [phone] Fax number:  [phone]   Type of Clearance Requested:   - Medical  - Pharmacy:  Hold Aspirin Not Indicated   Type of Anesthesia:   Propofol   Additional requests/questions:    Signed, Emmit Pomfret   06/30/2023, 4:32 PM

## 2023-06-30 NOTE — Telephone Encounter (Signed)
I will send a message to our scheduling team to reach out to the pt with new pt appt to re-establish. Pt last seen 2021.

## 2023-07-02 NOTE — Telephone Encounter (Signed)
LVM to schedule NP appt for cardiac clearance

## 2023-07-06 ENCOUNTER — Telehealth: Payer: Self-pay | Admitting: Cardiology

## 2023-07-06 ENCOUNTER — Encounter: Payer: Self-pay | Admitting: Cardiology

## 2023-07-06 DIAGNOSIS — R350 Frequency of micturition: Secondary | ICD-10-CM | POA: Insufficient documentation

## 2023-07-06 DIAGNOSIS — K219 Gastro-esophageal reflux disease without esophagitis: Secondary | ICD-10-CM | POA: Insufficient documentation

## 2023-07-06 NOTE — Telephone Encounter (Signed)
I will update the requesting office that our office has tried to reach him to schedule a new pt appt. Please have the pt call our office [phone] and schedule new pt appt for preop clearance.

## 2023-07-06 NOTE — Telephone Encounter (Signed)
Wilson, Jasmin B5 minutes ago (1:20 PM)   JW Patient returned Pre-op call and scheduled in-office visit on 2/6.      Note   [place], Rothgeb [phone]  Annetta Maw

## 2023-07-06 NOTE — Telephone Encounter (Signed)
Patient returned Pre-op call and scheduled in-office visit on 2/6.

## 2023-07-06 NOTE — Telephone Encounter (Signed)
Pt has new pt appt with Dr. Dulce Sellar 07/08/23.

## 2023-07-07 NOTE — Progress Notes (Signed)
 Cardiology Office Note:    Date:  07/08/2023   ID:  Joel Ward, DOB [birth year], MRN [id number]  PCP:  Joel Satterfield, MD  Cardiologist:  Joel Leiter, MD   Referring MD: Joel Satterfield, MD  ASSESSMENT:    1. Preoperative cardiovascular examination   2. Essential hypertension, benign   3. Coronary artery disease involving native coronary artery of native heart with unstable angina pectoris (HCC)   4. Mixed hyperlipidemia   5. OSA (obstructive sleep apnea)    PLAN:    In order of problems listed above:  He is seen today in preparation for colonoscopy with general anesthesia Procedure is elective low risk his heart disease is stable CAD and has hypertension hyperlipidemia and sleep apnea If needed he can stop his aspirin  for a week prior to procedure you generally resume 24 to 48 hours later at the discretion of the gastroenterologist Hypertension is controlled continue his treatment including diuretic and furosemide  along with his beta-blocker carvedilol . I asked him to purchase a digital blood pressure device good technique record and bring to his next follow-up visit Continue his lipid-lowering treatment lipid profile 04/21/2023 LDL 78 cholesterol 145 non-HDL cholesterol 98 He wants to establish cardiology care and I will direct him to my partner Dr Joel Ward in [place] he should be seen in 6 months     Medication Adjustments/Labs and Tests Ordered: Current medicines are reviewed at length with the patient today.  Concerns regarding medicines are outlined above.  Orders Placed This Encounter  Procedures   EKG 12-Lead   No orders of the defined types were placed in this encounter.    History of Present Illness:    Joel Ward is a 58 y.o. male who is being seen today to establish cardiology care and preoperative visit.  At the request of Joel Satterfield, MD. expanding endoscopy colonoscopy with propofol anesthesia.  Previously seen by my [employer] partner Dr.  Delford 09/01/2019 with a history of CAD with drug-eluting stent left anterior descending coronary artery October 2018 hypertension hyperlipidemia type 2 diabetes morbid obesity obstructive sleep apnea treated with CPAP  He had echocardiogram 01/09/2023 showing normal left ventricular size severe concentric LVH grade 1 diastolic dysfunction normal systolic function EF 60 to 65% and no valvular abnormality.  The aortic root was dilated 3.6 cm.  He has done well since PCI he does mechanical factory work vigorous active and has no exercise intolerance angina edema shortness of breath orthopnea palpitation or syncope He tolerates his statin without muscle pain or weakness He is taking lithium and tells me he was told that he has to take a potassium tablet and I will renew his prescription He does not check blood pressure have asked him to purchase an Omron device large cuff good technique record and bring to follow-up visits  Past Medical History:  Diagnosis Date   (HFpEF)/chronic diastolic dysfunction CHF 01/09/2023   Acute exacerbation of CHF (congestive heart failure) (HCC) 01/10/2023   Acute respiratory failure with hypoxia (HCC) 01/09/2023   CAD (coronary artery disease), native coronary artery    10/18 PCI/DES mLAD, normal EF on LV gram   Coronary artery disease involving native coronary artery of native heart with unstable angina pectoris (HCC) 03/16/2017   Diabetes mellitus    Type 2 diagnosed 2 weeks ago   DM type 2 causing vascular disease (HCC) 06/24/2011   Type 2 x 2 weeks     Dysphagia 06/24/2011   IMO SNOMED Dx Update Oct 2024  Essential hypertension, benign 03/24/2017   Hiatal hernia with gastroesophageal reflux    Hypertension    Mixed hyperlipidemia    Morbid obesity (HCC)    Multinodular goiter 07/08/2017   Nocturia more than twice per night 01/22/2015   OSA (obstructive sleep apnea) 01/09/2023   OSA on CPAP    Persistent circadian rhythm sleep disorder, shift work  type 01/22/2015   Snoring 01/22/2015   Traumatic plantar fasciitis 03/15/2012   Left foot.  Date of injury January 03, 2012     Urinary frequency    Vitamin D  deficiency 03/03/2018    Past Surgical History:  Procedure Laterality Date   CIRCUMCISION     1988   CORONARY STENT INTERVENTION N/A 03/16/2017   Procedure: CORONARY STENT INTERVENTION;  Surgeon: Joel Victory ORN, MD;  Location: [facility];  Service: Cardiovascular;  Laterality: N/A;   ESOPHAGEAL DILATION N/A 02/08/2015   Procedure: ESOPHAGEAL DILATION;  Surgeon: Joel RAYMOND Rivet, MD;  Location: [facility];  Service: Endoscopy;  Laterality: N/A;   ESOPHAGOGASTRODUODENOSCOPY N/A 02/08/2015   Procedure: ESOPHAGOGASTRODUODENOSCOPY (EGD);  Surgeon: Joel RAYMOND Rivet, MD;  Location: [facility];  Service: Endoscopy;  Laterality: N/A;  1055   ESOPHAGOGASTRODUODENOSCOPY (EGD) WITH ESOPHAGEAL DILATION  03/29/2012   Procedure: ESOPHAGOGASTRODUODENOSCOPY (EGD) WITH ESOPHAGEAL DILATION;  Surgeon: Joel RAYMOND Rivet, MD;  Location: [facility];  Service: Endoscopy;  Laterality: N/A;  730   LEFT HEART CATH AND CORONARY ANGIOGRAPHY N/A 03/16/2017   Procedure: LEFT HEART CATH AND CORONARY ANGIOGRAPHY;  Surgeon: Joel Victory ORN, MD;  Location: [facility];  Service: Cardiovascular;  Laterality: N/A;   ULTRASOUND GUIDANCE FOR VASCULAR ACCESS  03/16/2017   Procedure: Ultrasound Guidance For Vascular Access;  Surgeon: Joel Victory ORN, MD;  Location: [facility] INVASIVE CV LAB;  Service: Cardiovascular;;    Current Medications: Current Meds  Medication Sig   aspirin  EC 81 MG tablet Take 1 tablet (81 mg total) by mouth daily with breakfast.   atorvastatin  (LIPITOR) 40 MG tablet Take 1 tablet (40 mg total) by mouth daily.   carvedilol  (COREG ) 3.125 MG tablet Take 1 tablet (3.125 mg total) by mouth 2 (two) times daily. For BP   Cholecalciferol (VITAMIN D3) 125 MCG (5000 UT) CAPS Take 1 capsule (5,000 Units total) by mouth daily.   Continuous Blood Gluc  Receiver (FREESTYLE LIBRE 14 DAY READER) DEVI Use to check blood glucose 4 times daily.   Continuous Glucose Sensor (FREESTYLE LIBRE 3 PLUS SENSOR) MISC Change sensor every 15 days. E11.65   dapagliflozin  propanediol (FARXIGA ) 10 MG TABS tablet Take 1 tablet (10 mg total) by mouth daily before breakfast.   dextromethorphan -guaiFENesin  (MUCINEX  DM) 30-600 MG 12hr tablet Take 1 tablet by mouth 2 (two) times daily.   furosemide  (LASIX ) 20 MG tablet Take 1 tablet (20 mg total) by mouth daily.   insulin  glargine, 1 Unit Dial , (TOUJEO  SOLOSTAR) 300 UNIT/ML Solostar Pen Inject 80 Units into the skin at bedtime.   Insulin  Pen Needle (B-D ULTRAFINE III SHORT PEN) 31G X 8 MM MISC USE 1 PEN NEEDLE DAILY AS DIRECTED   lisinopril  (ZESTRIL ) 20 MG tablet Take 1 tablet (20 mg total) by mouth daily.   Melatonin 2.5 MG CAPS Take 2.5 mg by mouth at bedtime as needed (sleep).   metFORMIN  (GLUCOPHAGE -XR) 500 MG 24 hr tablet Take 1 tablet (500 mg total) by mouth daily with breakfast.   nitroGLYCERIN  (NITROSTAT ) 0.4 MG SL tablet Place 1 tablet (0.4 mg total) under the tongue every  5 (five) minutes as needed.   potassium chloride  (KLOR-CON ) 10 MEQ tablet Take 1 tablet (10 mEq total) by mouth daily. Take While taking Lasix /furosemide    sildenafil (VIAGRA) 100 MG tablet Take 100 mg by mouth as needed.     Allergies:   Bee venom, Shellfish allergy, and Strawberry extract   Social History   Socioeconomic History   Marital status: Married    Spouse name: Not on file   Number of children: Not on file   Years of education: Not on file   Highest education level: Not on file  Occupational History   Not on file  Tobacco Use   Smoking status: Never   Smokeless tobacco: Never  Vaping Use   Vaping status: Never Used  Substance and Sexual Activity   Alcohol use: No   Drug use: No   Sexual activity: Not on file  Other Topics Concern   Not on file  Social History Narrative   Drinks caffeine 4 times a week.       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   Social Drivers of Corporate Investment Banker Strain: Low Risk  (01/16/2022)   Overall Financial Resource Strain (CARDIA)    Difficulty of Paying Living Expenses: Not hard at all  Food Insecurity: No Food Insecurity (01/09/2023)   Hunger Vital Sign    Worried About Running Out of Food in the Last Year: Never true    Ran Out of Food in the Last Year: Never true  Transportation Needs: No Transportation Needs (01/09/2023)   PRAPARE - Administrator, Civil Service (Medical): No    Lack of Transportation (Non-Medical): No  Physical Activity: Not on file  Stress: Not on file  Social Connections: Not on file     Family History: The patient's family history includes Diabetes in his father and mother; Heart disease in his mother; Kidney failure in his mother. There is no history of Thyroid  disease.  ROS:   ROS Please see the history of present illness.     All other systems reviewed and are negative.  EKGs/Labs/Other Studies Reviewed:    The following studies were reviewed today:   Cardiac Studies & Procedures   CARDIAC CATHETERIZATION  CARDIAC CATHETERIZATION 03/16/2017  Narrative  Class III angina pectoris correlating with a 95% proximal to mid LAD stenosis. The LAD also contains eccentric proximal less than 50% narrowing.  Normal left main, circumflex, and right coronary.  Normal left ventricular systolic function, with mild elevation in LVEDP of 21 mmHg consistent with diastolic heart failure.  Successful culprit PCI of the proximal to mid LAD stenosis reducing the 95% obstruction to 0% with TIMI grade 3 flow using an 18 x 3.5 mm Exposed Dilated to 3.75 MmHg High Pressure.   RECOMMENDATIONS:  Aggressive risk factor modification: High intensity statin therapy 2 LDL less than 70, hemoglobin A1c less than 6.5, weight reduction, blood pressure control 130/85 mmHg or less, continue ACE inhibitor therapy, consider adding low-dose beta blocker  therapy,, and exercise.  Same day discharge of no complications.  No work for 7 days and appropriate instruction concerning resuming activity.  Consider phase II cardiac rehabilitation for educational purposes.  Findings Coronary Findings Diagnostic  Dominance: Co-dominant  Left Anterior Descending  Intervention  Mid LAD-2 lesion Angioplasty Lesion crossed with guidewire using a WIRE ASAHI PROWATER 180CM. Pre-stent angioplasty was performed using a BALLOON SAPPHIRE 3.0X12. A STENT RESOLUTE ONYX 3.5X18 drug eluting stent was successfully placed. Stent strut is well apposed. Post-stent  angioplasty was performed using a BALLOON SAPPHIRE [place] 3.75X8. Maximum pressure: 14 atm. The pre-interventional distal flow is normal (TIMI 3).  The post-interventional distal flow is normal (TIMI 3). The intervention was successful . No complications occurred at this lesion. Pressure wire/FFR was not performed on the lesion . IVUS was not performed on the lesion . There is a 0% residual stenosis post intervention.    ECHOCARDIOGRAM  ECHOCARDIOGRAM COMPLETE 01/09/2023  Narrative ECHOCARDIOGRAM REPORT    Patient Name:   Joel Ward Date of Exam: 01/09/2023 Medical Rec #:  [id number]         Height:       71.0 in Accession #:    [id number]        Weight:       300.0 lb Date of Birth:  11-27-1965         BSA:          2.505 m Patient Age:    57 years          BP:           167/95 mmHg Patient Gender: M                 HR:           86 bpm. Exam Location:  [facility]  Procedure: 2D Echo, Cardiac Doppler, Color Doppler and Intracardiac Opacification Agent  Indications:    R94.31 Abnormal EKG  History:        Patient has no prior history of Echocardiogram examinations. Signs/Symptoms:Shortness of Breath, Dyspnea and Chest Pain; Risk Factors:Diabetes, Dyslipidemia and Hypertension.  Sonographer:    Ellouise Mose RDCS Referring Phys: [phone] [facility]   Sonographer Comments: Technically  difficult study due to poor echo windows, suboptimal parasternal window, suboptimal apical window, suboptimal subcostal window and patient is obese. Image acquisition challenging due to patient body habitus. IMPRESSIONS   1. Left ventricular ejection fraction, by estimation, is 60 to 65%. The left ventricle has normal function. The left ventricle has no regional wall motion abnormalities. There is severe concentric left ventricular hypertrophy. Left ventricular diastolic parameters are consistent with Grade I diastolic dysfunction (impaired relaxation). 2. Right ventricular systolic function is normal. The right ventricular size is mildly enlarged. 3. The mitral valve is normal in structure. No evidence of mitral valve regurgitation. No evidence of mitral stenosis. 4. The aortic valve was not well visualized. Aortic valve regurgitation is not visualized. No aortic stenosis is present. 5. Aortic dilatation noted. There is dilatation of the aortic root.  Comparison(s): No prior Echocardiogram.  FINDINGS Left Ventricle: Left ventricular ejection fraction, by estimation, is 60 to 65%. The left ventricle has normal function. The left ventricle has no regional wall motion abnormalities. Definity  contrast agent was given IV to delineate the left ventricular endocardial borders. The left ventricular internal cavity size was normal in size. There is severe concentric left ventricular hypertrophy. Left ventricular diastolic parameters are consistent with Grade I diastolic dysfunction (impaired relaxation).  Right Ventricle: The right ventricular size is mildly enlarged. Right vetricular wall thickness was not well visualized. Right ventricular systolic function is normal.  Left Atrium: Left atrial size was normal in size.  Right Atrium: Right atrial size was normal in size.  Pericardium: Trivial pericardial effusion is present. The pericardial effusion is anterior to the right ventricle. Presence of  epicardial fat layer.  Mitral Valve: The mitral valve is normal in structure. No evidence of mitral valve regurgitation. No evidence of mitral valve stenosis.  Tricuspid Valve: The tricuspid valve is not well visualized. Tricuspid valve regurgitation is not demonstrated. No evidence of tricuspid stenosis.  Aortic Valve: The aortic valve was not well visualized. Aortic valve regurgitation is not visualized. No aortic stenosis is present.  Pulmonic Valve: The pulmonic valve was normal in structure. Pulmonic valve regurgitation is not visualized. No evidence of pulmonic stenosis.  Aorta: The aortic root and ascending aorta are structurally normal, with no evidence of dilitation and aortic dilatation noted. There is dilatation of the aortic root.  IAS/Shunts: No atrial level shunt detected by color flow Doppler.   LEFT VENTRICLE PLAX 2D LVIDd:         4.70 cm     Diastology LVIDs:         3.00 cm     LV e' medial:    4.46 cm/s LV PW:         1.60 cm     LV E/e' medial:  18.8 LV IVS:        1.70 cm     LV e' lateral:   7.62 cm/s LVOT diam:     2.40 cm     LV E/e' lateral: 11.0 LV SV:         80 LV SV Index:   32 LVOT Area:     4.52 cm  LV Volumes (MOD) LV vol d, MOD A2C: 83.9 ml LV vol d, MOD A4C: 81.7 ml LV vol s, MOD A2C: 39.2 ml LV vol s, MOD A4C: 25.3 ml LV SV MOD A2C:     44.8 ml LV SV MOD A4C:     81.7 ml LV SV MOD BP:      50.8 ml  RIGHT VENTRICLE             IVC RV S prime:     16.50 cm/s  IVC diam: 1.50 cm TAPSE (M-mode): 2.4 cm  LEFT ATRIUM             Index        RIGHT ATRIUM           Index LA diam:        3.00 cm 1.20 cm/m   RA Area:     17.10 cm LA Vol (A2C):   36.8 ml 14.69 ml/m  RA Volume:   47.20 ml  18.84 ml/m LA Vol (A4C):   55.9 ml 22.31 ml/m LA Biplane Vol: 46.6 ml 18.60 ml/m AORTIC VALVE LVOT Vmax:   109.00 cm/s LVOT Vmean:  70.000 cm/s LVOT VTI:    0.176 m  AORTA Ao Root diam: 3.60 cm Ao Asc diam:  3.70 cm  MITRAL VALVE MV Area (PHT):  4.06 cm    SHUNTS MV Decel Time: 187 msec    Systemic VTI:  0.18 m MV E velocity: 83.70 cm/s  Systemic Diam: 2.40 cm MV A velocity: 85.90 cm/s MV E/A ratio:  0.97  Stanly Leavens MD Electronically signed by Stanly Leavens MD Signature Date/Time: 01/09/2023/2:22:36 PM    Final             EKG Interpretation Date/Time:  Thursday July 08 2023 14:32:01 EST Ventricular Rate:  86 PR Interval:  170 QRS Duration:  102 QT Interval:  372 QTC Calculation: 445 R Axis:   80  Text Interpretation: Normal sinus rhythm Normal ECG When compared with ECG of 11-Jan-2023 08:44, Criteria for Septal infarct are no longer Present Confirmed by Monetta Rogue ([id number]) on 07/08/2023 2:37:48 PM    Recent Labs:  07/30/2022: TSH 1.830 01/09/2023: B Natriuretic Peptide 11.0 01/11/2023: Hemoglobin 13.7; Platelets 335 04/15/2023: ALT 22; BUN 16; Creat 0.99; Potassium 4.2; Sodium 140  Recent Lipid Panel    Component Value Date/Time   CHOL 131 07/30/2022 0816   TRIG 94 07/30/2022 0816   HDL 51 07/30/2022 0816   CHOLHDL 2.6 07/30/2022 0816   CHOLHDL 3.0 10/12/2019 0833   VLDL 21 06/29/2017 0807   LDLCALC 62 07/30/2022 0816   LDLCALC 64 10/12/2019 0833    Physical Exam:    VS:  BP 136/80 (Cuff Size: Large)   Pulse 86   Ht 5' 10 (1.778 m)   Wt (!) 317 lb 3.2 oz (143.9 kg)   SpO2 93%   BMI 45.51 kg/m     Wt Readings from Last 3 Encounters:  07/08/23 (!) 317 lb 3.2 oz (143.9 kg)  04/26/23 (!) 308 lb 3.2 oz (139.8 kg)  01/11/23 (!) 300 lb 11.2 oz (136.4 kg)     GEN: Obese BMI 45.5 well nourished, well developed in no acute distress HEENT: Normal NECK: No JVD; No carotid bruits LYMPHATICS: No lymphadenopathy CARDIAC: RRR, no murmurs, rubs, gallops RESPIRATORY:  Clear to auscultation without rales, wheezing or rhonchi  ABDOMEN: Soft, non-tender, non-distended MUSCULOSKELETAL:  No edema; No deformity  SKIN: Warm and dry NEUROLOGIC:  Alert and oriented x 3 PSYCHIATRIC:  Normal  affect     Signed, Joel Leiter, MD  07/08/2023 2:51 PM    [facility]

## 2023-07-08 ENCOUNTER — Encounter: Payer: Self-pay | Admitting: Cardiology

## 2023-07-08 ENCOUNTER — Ambulatory Visit: Payer: BC Managed Care – PPO | Attending: Cardiology | Admitting: Cardiology

## 2023-07-08 VITALS — BP 136/80 | HR 86 | Ht 70.0 in | Wt 317.2 lb

## 2023-07-08 DIAGNOSIS — E782 Mixed hyperlipidemia: Secondary | ICD-10-CM

## 2023-07-08 DIAGNOSIS — K219 Gastro-esophageal reflux disease without esophagitis: Secondary | ICD-10-CM

## 2023-07-08 DIAGNOSIS — R350 Frequency of micturition: Secondary | ICD-10-CM

## 2023-07-08 DIAGNOSIS — Z01810 Encounter for preprocedural cardiovascular examination: Secondary | ICD-10-CM | POA: Diagnosis not present

## 2023-07-08 DIAGNOSIS — I25110 Atherosclerotic heart disease of native coronary artery with unstable angina pectoris: Secondary | ICD-10-CM

## 2023-07-08 DIAGNOSIS — I10 Essential (primary) hypertension: Secondary | ICD-10-CM | POA: Diagnosis not present

## 2023-07-08 DIAGNOSIS — G4733 Obstructive sleep apnea (adult) (pediatric): Secondary | ICD-10-CM

## 2023-07-08 MED ORDER — POTASSIUM CHLORIDE ER 10 MEQ PO TBCR: 10.0000 meq | EXTENDED_RELEASE_TABLET | Freq: Every day | ORAL | 3 refills | Status: DC

## 2023-07-08 NOTE — Patient Instructions (Addendum)
 Medication Instructions:  Your physician recommends that you continue on your current medications as directed. Please refer to the Current Medication list given to you today.  *If you need a refill on your cardiac medications before your next appointment, please call your pharmacy*   Lab Work: None If you have labs (blood work) drawn today and your tests are completely normal, you will receive your results only by: MyChart Message (if you have MyChart) OR A paper copy in the mail If you have any lab test that is abnormal or we need to change your treatment, we will call you to review the results.   Testing/Procedures: None   Follow-Up: At [facility], you and your health needs are our priority.  As part of our continuing mission to provide you with exceptional heart care, we have created designated Provider Care Teams.  These Care Teams include your primary Cardiologist (physician) and Advanced Practice Providers (APPs -  Physician Assistants and Nurse Practitioners) who all work together to provide you with the care you need, when you need it.  We recommend signing up for the patient portal called MyChart.  Sign up information is provided on this After Visit Summary.  MyChart is used to connect with patients for Virtual Visits (Telemedicine).  Patients are able to view lab/test results, encounter notes, upcoming appointments, etc.  Non-urgent messages can be sent to your provider as well.   To learn more about what you can do with MyChart, go to [email or web address].    Your next appointment:   6 month(s)  Provider:   Dr. Sheena  Other Instructions Purchase an Omron blood pressure device with a large cuff  Check and record blood pressures daily and bring the list of blood pressures to your next office visit.          [facility]  Tips to measure your blood pressure correctly  To determine whether you have hypertension, a medical professional will take a blood  pressure reading. How you prepare for the test, the position of your arm, and other factors can change a blood pressure reading by 10% or more. That could be enough to hide high blood pressure, start you on a drug you don't really need, or lead your doctor to incorrectly adjust your medications. National and international guidelines offer specific instructions for measuring blood pressure. If a doctor, nurse, or medical assistant isn't doing it right, don't hesitate to ask him or her to get with the guidelines. Here's what you can do to ensure a correct reading:  Don't drink a caffeinated beverage or smoke during the 30 minutes before the test.  Sit quietly for five minutes before the test begins.  During the measurement, sit in a chair with your feet on the floor and your arm supported so your elbow is at about heart level.  The inflatable part of the cuff should completely cover at least 80% of your upper arm, and the cuff should be placed on bare skin, not over a shirt.  Don't talk during the measurement.  Have your blood pressure measured twice, with a brief break in between. If the readings are different by 5 points or more, have it done a third time. There are times to break these rules. If you sometimes feel lightheaded when getting out of bed in the morning or when you stand after sitting, you should have your blood pressure checked while seated and then while standing to see if it falls from one position to the  next. Because blood pressure varies throughout the day, your doctor will rarely diagnose hypertension on the basis of a single reading. Instead, he or she will want to confirm the measurements on at least two occasions, usually within a few weeks of one another. The exception to this rule is if you have a blood pressure reading of 180/110 mm Hg or higher. A result this high usually calls for prompt treatment. It's also a good idea to have your blood pressure measured in both arms at least  once, since the reading in one arm (usually the right) may be higher than that in the left. A 2014 study in The American Journal of Medicine of nearly 3,400 people found average arm- to-arm differences in systolic blood pressure of about 5 points. The higher number should be used to make treatment decisions. In 2017, new guidelines from the American Heart Association, the [employer] of Cardiology, and nine other health organizations lowered the diagnosis of high blood pressure to 130/80 mm Hg or higher for all adults. The guidelines also redefined the various blood pressure categories to now include normal, elevated, Stage 1 hypertension, Stage 2 hypertension, and hypertensive crisis (see Blood pressure categories). Blood pressure categories  Blood pressure category SYSTOLIC (upper number)  DIASTOLIC (lower number)  Normal Less than 120 mm Hg and Less than 80 mm Hg  Elevated 120-129 mm Hg and Less than 80 mm Hg  High blood pressure: Stage 1 hypertension 130-139 mm Hg or 80-89 mm Hg  High blood pressure: Stage 2 hypertension 140 mm Hg or higher or 90 mm Hg or higher  Hypertensive crisis (consult your doctor immediately) Higher than 180 mm Hg and/or Higher than 120 mm Hg  Source: American Heart Association and American Stroke Association. For more on getting your blood pressure under control, buy Controlling Your Blood Pressure, a Special Health Report from [facility].

## 2023-07-10 DIAGNOSIS — G4733 Obstructive sleep apnea (adult) (pediatric): Secondary | ICD-10-CM | POA: Diagnosis not present

## 2023-07-13 DIAGNOSIS — G4733 Obstructive sleep apnea (adult) (pediatric): Secondary | ICD-10-CM | POA: Diagnosis not present

## 2023-07-13 DIAGNOSIS — G4730 Sleep apnea, unspecified: Secondary | ICD-10-CM | POA: Diagnosis not present

## 2023-08-04 DIAGNOSIS — R1310 Dysphagia, unspecified: Secondary | ICD-10-CM | POA: Diagnosis not present

## 2023-08-04 DIAGNOSIS — K449 Diaphragmatic hernia without obstruction or gangrene: Secondary | ICD-10-CM | POA: Diagnosis not present

## 2023-08-04 DIAGNOSIS — D123 Benign neoplasm of transverse colon: Secondary | ICD-10-CM | POA: Diagnosis not present

## 2023-08-04 DIAGNOSIS — K2970 Gastritis, unspecified, without bleeding: Secondary | ICD-10-CM | POA: Diagnosis not present

## 2023-08-04 DIAGNOSIS — D132 Benign neoplasm of duodenum: Secondary | ICD-10-CM | POA: Diagnosis not present

## 2023-08-04 DIAGNOSIS — Z1211 Encounter for screening for malignant neoplasm of colon: Secondary | ICD-10-CM | POA: Diagnosis not present

## 2023-08-04 DIAGNOSIS — D122 Benign neoplasm of ascending colon: Secondary | ICD-10-CM | POA: Diagnosis not present

## 2023-08-04 DIAGNOSIS — K222 Esophageal obstruction: Secondary | ICD-10-CM | POA: Diagnosis not present

## 2023-08-04 DIAGNOSIS — K635 Polyp of colon: Secondary | ICD-10-CM | POA: Diagnosis not present

## 2023-08-04 DIAGNOSIS — D181 Lymphangioma, any site: Secondary | ICD-10-CM | POA: Diagnosis not present

## 2023-08-04 DIAGNOSIS — K2930 Chronic superficial gastritis without bleeding: Secondary | ICD-10-CM | POA: Diagnosis not present

## 2023-08-04 DIAGNOSIS — D125 Benign neoplasm of sigmoid colon: Secondary | ICD-10-CM | POA: Diagnosis not present

## 2023-08-04 DIAGNOSIS — K3189 Other diseases of stomach and duodenum: Secondary | ICD-10-CM | POA: Diagnosis not present

## 2023-08-07 DIAGNOSIS — G4733 Obstructive sleep apnea (adult) (pediatric): Secondary | ICD-10-CM | POA: Diagnosis not present

## 2023-08-10 DIAGNOSIS — G4733 Obstructive sleep apnea (adult) (pediatric): Secondary | ICD-10-CM | POA: Diagnosis not present

## 2023-08-10 DIAGNOSIS — G4730 Sleep apnea, unspecified: Secondary | ICD-10-CM | POA: Diagnosis not present

## 2023-08-19 ENCOUNTER — Ambulatory Visit: Admitting: "Endocrinology

## 2023-08-19 ENCOUNTER — Encounter: Payer: Self-pay | Admitting: "Endocrinology

## 2023-08-19 VITALS — BP 136/88 | HR 80 | Ht 70.0 in | Wt 317.2 lb

## 2023-08-19 DIAGNOSIS — I10 Essential (primary) hypertension: Secondary | ICD-10-CM | POA: Diagnosis not present

## 2023-08-19 DIAGNOSIS — E782 Mixed hyperlipidemia: Secondary | ICD-10-CM | POA: Diagnosis not present

## 2023-08-19 DIAGNOSIS — E1159 Type 2 diabetes mellitus with other circulatory complications: Secondary | ICD-10-CM | POA: Diagnosis not present

## 2023-08-19 DIAGNOSIS — Z794 Long term (current) use of insulin: Secondary | ICD-10-CM

## 2023-08-19 DIAGNOSIS — E559 Vitamin D deficiency, unspecified: Secondary | ICD-10-CM

## 2023-08-19 LAB — POCT GLYCOSYLATED HEMOGLOBIN (HGB A1C): HbA1c, POC (controlled diabetic range): 8.1 % — AB (ref 0.0–7.0)

## 2023-08-19 MED ORDER — TOUJEO MAX SOLOSTAR 300 UNIT/ML ~~LOC~~ SOPN: 90.0000 [IU] | PEN_INJECTOR | Freq: Every evening | SUBCUTANEOUS | 2 refills | Status: DC

## 2023-08-19 MED ORDER — METFORMIN HCL ER 500 MG PO TB24
500.0000 mg | ORAL_TABLET | Freq: Every day | ORAL | 1 refills | Status: DC
Start: 2023-08-19 — End: 2024-02-08

## 2023-08-19 NOTE — Progress Notes (Signed)
 08/19/2023   Endocrinology follow-up note   Subjective:    [name], male    DOB: [birth year].  he is being seen in follow-up after he was seen in consultation for  management of currently uncontrolled symptomatic uncontrolled type 2 diabetes, hyperlipidemia, hypertension.  PMD:   Elfredia Nevins, MD.   Past Medical History:  Diagnosis Date   (HFpEF)/chronic diastolic dysfunction CHF 01/09/2023   Acute exacerbation of CHF (congestive heart failure) (HCC) 01/10/2023   Acute respiratory failure with hypoxia (HCC) 01/09/2023   CAD (coronary artery disease), native coronary artery    10/18 PCI/DES mLAD, normal EF on LV gram   Coronary artery disease involving native coronary artery of native heart with unstable angina pectoris (HCC) 03/16/2017   Diabetes mellitus    Type 2 diagnosed 2 weeks ago   DM type 2 causing vascular disease (HCC) 06/24/2011   Type 2 x 2 weeks     Dysphagia 06/24/2011   IMO SNOMED Dx Update Oct 2024     Essential hypertension, benign 03/24/2017   Hiatal hernia with gastroesophageal reflux    Hypertension    Mixed hyperlipidemia    Morbid obesity (HCC)    Multinodular goiter 07/08/2017   Nocturia more than twice per night 01/22/2015   OSA (obstructive sleep apnea) 01/09/2023   OSA on CPAP    Persistent circadian rhythm sleep disorder, 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  Date of injury January 03, 2012     Urinary frequency    Vitamin D deficiency 03/03/2018   Past Surgical History:  Procedure Laterality Date   CIRCUMCISION     1988   COLONOSCOPY  07/06/2022   CORONARY STENT INTERVENTION N/A 03/16/2017   Procedure: CORONARY STENT INTERVENTION;  Surgeon: Lyn Records, MD;  Location: [facility];  Service: Cardiovascular;  Laterality: N/A;   ESOPHAGEAL DILATION N/A 02/08/2015   Procedure: ESOPHAGEAL DILATION;  Surgeon: Malissa Hippo, MD;  Location: [facility]  ENDO SUITE;  Service: Endoscopy;  Laterality: N/A;   ESOPHAGOGASTRODUODENOSCOPY N/A 02/08/2015   Procedure: ESOPHAGOGASTRODUODENOSCOPY (EGD);  Surgeon: Malissa Hippo, MD;  Location: [facility];  Service: Endoscopy;  Laterality: N/A;  1055   ESOPHAGOGASTRODUODENOSCOPY (EGD) WITH ESOPHAGEAL DILATION  03/29/2012   Procedure: ESOPHAGOGASTRODUODENOSCOPY (EGD) WITH ESOPHAGEAL DILATION;  Surgeon: Malissa Hippo, MD;  Location: [facility];  Service: Endoscopy;  Laterality: N/A;  730   LEFT HEART CATH AND CORONARY ANGIOGRAPHY N/A 03/16/2017   Procedure: LEFT HEART CATH AND CORONARY ANGIOGRAPHY;  Surgeon: Lyn Records, MD;  Location: [facility];  Service: Cardiovascular;  Laterality: N/A;   ULTRASOUND GUIDANCE FOR VASCULAR ACCESS  03/16/2017   Procedure: Ultrasound Guidance For Vascular Access;  Surgeon: Lyn Records, MD;  Location: [facility] INVASIVE CV LAB;  Service: Cardiovascular;;   Social History   Socioeconomic History   Marital status: Married    Spouse name: Not on file   Number of children: Not on file   Years of education: Not on file   Highest education level: Not on file  Occupational History   Not on file  Tobacco Use   Smoking status: Never   Smokeless tobacco: Never  Vaping Use   Vaping status: Never Used  Substance and Sexual Activity   Alcohol use: No   Drug use: No   Sexual activity: Not on file  Other Topics Concern   Not on file  Social History Narrative   Drinks caffeine 4 times a week.      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   Social Drivers of Corporate investment banker Strain: Low Risk  (01/16/2022)   Overall Financial Resource Strain (CARDIA)    Difficulty of Paying Living Expenses: Not hard at all  Food Insecurity: No Food Insecurity (01/09/2023)   Hunger Vital Sign    Worried About Running Out of Food in the Last Year: Never true    Ran Out of Food in the Last Year: Never true  Transportation Needs: No Transportation Needs (01/09/2023)   PRAPARE -  Administrator, Civil Service (Medical): No    Lack of Transportation (Non-Medical): No  Physical Activity: Not on file  Stress: Not on file  Social Connections: Not on file   Outpatient Encounter Medications as of 08/19/2023  Medication Sig   insulin glargine, 2 Unit Dial, (TOUJEO MAX SOLOSTAR) 300 UNIT/ML Solostar Pen Inject 90 Units into the skin at bedtime.   aspirin EC 81 MG tablet Take 1 tablet (81 mg total) by mouth daily with breakfast.   atorvastatin (LIPITOR) 40 MG tablet Take 1 tablet (40 mg total) by mouth daily.   carvedilol (COREG) 3.125 MG tablet Take 1 tablet (3.125 mg total) by mouth 2 (two) times daily. For BP   Cholecalciferol (VITAMIN D3) 125 MCG (5000 UT) CAPS Take 1 capsule (5,000 Units total) by mouth daily.   Continuous Glucose Sensor (FREESTYLE LIBRE 3 PLUS SENSOR) MISC Change sensor every 15 days. E11.65   dapagliflozin propanediol (FARXIGA) 10 MG TABS tablet Take 1 tablet (10 mg total) by mouth daily before breakfast.   dextromethorphan-guaiFENesin (MUCINEX DM) 30-600 MG 12hr tablet Take 1 tablet by mouth 2 (two) times daily.   furosemide (LASIX) 20 MG tablet Take 1 tablet (20 mg total) by mouth daily.   Insulin Pen Needle (B-D ULTRAFINE III SHORT PEN) 31G X 8 MM MISC USE 1 PEN NEEDLE DAILY AS DIRECTED   lisinopril (ZESTRIL) 20 MG tablet Take 1 tablet (20 mg total) by mouth daily.   Melatonin 2.5 MG CAPS Take 2.5 mg by mouth at bedtime as needed (sleep).   metFORMIN (GLUCOPHAGE-XR) 500 MG 24 hr tablet Take 1 tablet (500 mg total) by mouth daily with breakfast.   nitroGLYCERIN (NITROSTAT) 0.4 MG SL tablet Place 1 tablet (0.4 mg total) under the tongue every 5 (five) minutes as needed.   potassium chloride (KLOR-CON) 10 MEQ tablet Take 1 tablet (10 mEq total) by mouth daily. Take While taking Lasix/furosemide   sildenafil (VIAGRA) 100 MG tablet Take 100 mg by mouth as needed.   [DISCONTINUED] Continuous Blood Gluc Receiver (FREESTYLE LIBRE 14 DAY READER)  DEVI Use to check blood glucose 4 times daily.   [DISCONTINUED] insulin glargine, 1 Unit Dial, (TOUJEO SOLOSTAR) 300 UNIT/ML Solostar Pen Inject 80 Units into the skin at bedtime.   [DISCONTINUED] metFORMIN (GLUCOPHAGE-XR) 500 MG 24 hr tablet Take 1 tablet (500 mg total) by mouth daily with breakfast.   No facility-administered encounter medications on file as of 08/19/2023.    ALLERGIES: Allergies  Allergen Reactions   Bee Venom    Shellfish Allergy Swelling    Turns red   Strawberry Extract Rash    VACCINATION STATUS: Immunization History  Administered Date(s) Administered   Moderna Sars-Covid-2 Vaccination 08/24/2019, 09/27/2019   PNEUMOCOCCAL CONJUGATE-20 01/11/2023    Diabetes He presents for his follow-up diabetic visit. He has type 2 diabetes mellitus. Onset time: He reports that he  was diagnosed at approximate age of 55 years. His disease course has been worsening. There are no hypoglycemic associated symptoms. Pertinent negatives for hypoglycemia include no confusion, headaches, pallor, seizures or tremors. Pertinent negatives for diabetes include no blurred vision, no chest pain, no fatigue, no polydipsia, no polyphagia, no polyuria and no weakness. There are no hypoglycemic complications. Symptoms are worsening. Diabetic complications include heart disease. Risk factors for coronary artery disease include dyslipidemia, diabetes mellitus, family history, hypertension, male sex, obesity and sedentary lifestyle. Current diabetic treatment includes insulin injections (Is currently on Toujeo 60 units nightly, Janumet 50/1000 mg p.o. daily, glipizide 5 g p.o. daily.). His weight is increasing steadily (Overall achieved 38 pounds weight loss.). He is following a generally unhealthy diet. When asked about meal planning, he reported none. He has not had a previous visit with a dietitian. He participates in exercise intermittently. His home blood glucose trend is increasing steadily. His  breakfast blood glucose range is generally >200 mg/dl. His lunch blood glucose range is generally >200 mg/dl. His dinner blood glucose range is generally >200 mg/dl. His bedtime blood glucose range is generally >200 mg/dl. His overall blood glucose range is >200 mg/dl. Cerami returns with significantly above target glycemic profile with average blood glucose of 221 for the last 14 days.  His AGP report shows 32% time in range, 35% Level One hyperglycemia, 33% level 2 hyperglycemia.  He has no hypoglycemia.  His point-of-care A1c is 8.1% increasing from 7.5%.     ) An ACE inhibitor/angiotensin II receptor blocker is being taken. He does not see a podiatrist.Eye exam is not current.  Hyperlipidemia This is a chronic problem. The current episode started more than 1 year ago. The problem is controlled. Exacerbating diseases include diabetes and obesity. Pertinent negatives include no chest pain, myalgias or shortness of breath. Current antihyperlipidemic treatment includes statins. Risk factors for coronary artery disease include dyslipidemia, diabetes mellitus, hypertension, male sex, obesity, a sedentary lifestyle and family history.  Hypertension This is a chronic problem. The problem is controlled. Pertinent negatives include no blurred vision, chest pain, headaches, neck pain, palpitations or shortness of breath. Risk factors for coronary artery disease include diabetes mellitus, dyslipidemia, obesity, male gender, sedentary lifestyle and family history. Past treatments include ACE inhibitors. Hypertensive end-organ damage includes CAD/MI.     Review of systems Limited as above.   Objective:    BP 136/88   Pulse 80   Ht 5\' 10"  (1.778 m)   Wt (!) 317 lb 3.2 oz (143.9 kg)   BMI 45.51 kg/m   Wt Readings from Last 3 Encounters:  08/19/23 (!) 317 lb 3.2 oz (143.9 kg)  07/08/23 (!) 317 lb 3.2 oz (143.9 kg)  04/26/23 (!) 308 lb 3.2 oz (139.8 kg)      Physical Exam-  Limited  Constitutional:  Body mass index is 45.51 kg/m. , not in acute distress, normal state of mind   CMP ( most recent) CMP     Component Value Date/Time   NA 140 04/15/2023 1020   NA 145 (H) 07/30/2022 0816   K 4.2 04/15/2023 1020   CL 104 04/15/2023 1020   CO2 31 04/15/2023 1020   GLUCOSE 89 04/15/2023 1020   BUN 16 04/15/2023 1020   BUN 13 07/30/2022 0816   CREATININE 0.99 04/15/2023 1020   CALCIUM 9.5 04/15/2023 1020   PROT 7.3 04/15/2023 1020   PROT 7.5 07/30/2022 0816   ALBUMIN 3.8 01/11/2023 0344   ALBUMIN 4.5 07/30/2022 0816  AST 18 04/15/2023 1020   ALT 22 04/15/2023 1020   ALKPHOS 83 07/30/2022 0816   BILITOT 0.5 04/15/2023 1020   BILITOT 0.5 07/30/2022 0816   GFRNONAA >60 01/11/2023 0344   GFRNONAA 95 10/12/2019 0833   GFRAA >60 01/20/2020 0556   GFRAA 110 10/12/2019 0833     Diabetic Labs (most recent): Lab Results  Component Value Date   HGBA1C 8.1 (A) 08/19/2023   HGBA1C 7.5 (H) 04/15/2023   HGBA1C 7.1 (A) 12/10/2022   MICROALBUR 1.8 10/12/2019   MICROALBUR 0.5 03/01/2018     Lipid Panel ( most recent) Lipid Panel     Component Value Date/Time   CHOL 131 07/30/2022 0816   TRIG 94 07/30/2022 0816   HDL 51 07/30/2022 0816   CHOLHDL 2.6 07/30/2022 0816   CHOLHDL 3.0 10/12/2019 0833   VLDL 21 06/29/2017 0807   LDLCALC 62 07/30/2022 0816   LDLCALC 64 10/12/2019 0833     Assessment & Plan:   1. DM type 2 causing vascular disease (HCC) - Patient has currently uncontrolled symptomatic type 2 DM since  58 years of age.  Dany returns with significantly above target glycemic profile with average blood glucose of 221 for the last 14 days.  His AGP report shows 32% time in range, 35% Level One hyperglycemia, 33% level 2 hyperglycemia.  He has no hypoglycemia.  His point-of-care A1c is 8.1% increasing from 7.5%.     Recent labs reviewed with him showing normal renal function.  -his diabetes is complicated by coronary artery disease with  stent placement on 03/15/2017 and JERIMY JOHANSON remains at extremely  high risk for more acute and chronic complications which include CHF, obstructive sleep apnea, CAD, CVA, CKD, retinopathy, and neuropathy. These are all discussed in detail with the patient.  - I have counseled him on diet management and weight loss, by adopting a carbohydrate restricted/protein rich diet. -He did admits to dietary indiscretions including consumption of sweets and sweetened beverages.     He is advised to reengage with lifestyle medicine.   - he acknowledges that there is a room for improvement in his food and drink choices. - Suggestion is made for him to avoid simple carbohydrates  from his diet including Cakes, Sweet Desserts, Ice Cream, Soda (diet and regular), Sweet Tea, Candies, Chips, Cookies, Store Bought Juices, Alcohol , Artificial Sweeteners,  Coffee Creamer, and "Sugar-free" Products, Lemonade. This will help patient to have more stable blood glucose profile and potentially avoid unintended weight gain.  The following Lifestyle Medicine recommendations according to American College of Lifestyle Medicine  [facility]) were discussed and and offered to patient and he  agrees to start the journey:  A. Whole Foods, Plant-Based Nutrition comprising of fruits and vegetables, plant-based proteins, whole-grain carbohydrates was discussed in detail with the patient.   A list for source of those nutrients were also provided to the patient.  Patient will use only water or unsweetened tea for hydration. B.  The need to stay away from risky substances including alcohol, smoking; obtaining 7 to 9 hours of restorative sleep, at least 150 minutes of moderate intensity exercise weekly, the importance of healthy social connections,  and stress management techniques were discussed. C.  A full color page of  Calorie density of various food groups per pound showing examples of each food groups was provided to the  patient.   - I encouraged him to switch to  unprocessed or minimally processed complex starch and increased protein intake (  animal or plant source), fruits, and vegetables.  - he is advised to stick to a routine mealtimes to eat 3 meals  a day and avoid unnecessary snacks ( to snack only to correct hypoglycemia).   - he has been  scheduled with Norm Salt, RDN, CDE for individualized DM education- consult pending.  - I have approached him with the following individualized plan to manage diabetes and patient agrees:   -In light of his presentation with above target glycemic profile, he will need a higher dose of insulin.  He will not need prandial insulin for now.    -I discussed and increase his Toujeo to 90 units nightly associated with utilizing his CGM continuously.   -He is encouraged to call clinic for hypoglycemia below 70 and hyperglycemia above 200 mg/dL. -He is advised to continue Farxiga 10 mg p.o. daily at breakfast along with metformin 500 mg XR p.o. daily at breakfast.   He will be considered for GLP-1 receptor agonist next visit.  2) BP/HTN: His blood pressure is controlled to target.  He is advised to continue  lisinopril to 20 mg p.o. daily at breakfast.    3) Lipids/HPL: His LDL is improving to 62.  He did not stay engaged with lifestyle medicine nutrition.  He is advised to continue atorvastatin 40 mg p.o. nightly.  He will have fasting lipid panel before his next visit.     4)  Weight/Diet: His current BMI is 45.5- He is still a candidate for modest weight loss.  I discussed with him the fact that he would benefit the most from loss of 5 - 10% of his current body weight .  He is benefiting from whole food plant-based diet.     5) vitamin D deficiency:  He is status post treatment with vitamin D2 50,000 units weekly for several weeks.  He is advised to maintain with vitamin D3 5000 units daily.     His labs still show supraphysiologic level of vitamin B12.  He  is advised to stay off of vitamin B12 supplements  6) Chronic Care/Health Maintenance:  -he  is on ACEI/ARB and Statin medications and  is encouraged to continue to follow up with Ophthalmology, Dentist,  Podiatrist at least yearly or according to recommendations, and advised to  stay away from smoking. I have recommended yearly flu vaccine and pneumonia vaccination at least every 5 years; moderate intensity exercise for up to 150 minutes weekly; and  sleep for at least 7 hours a day.  He had negative ABI for PAD in April 2022.  His study will be repeated in April 2027, or sooner if needed.   - I advised patient to maintain close follow up with Elfredia Nevins, MD for primary care needs.   I spent  41  minutes in the care of the patient today including review of labs from CMP, Lipids, Thyroid Function, Hematology (current and previous including abstractions from other facilities); face-to-face time discussing  his blood glucose readings/logs, discussing hypoglycemia and hyperglycemia episodes and symptoms, medications doses, his options of short and long term treatment based on the latest standards of care / guidelines;  discussion about incorporating lifestyle medicine;  and documenting the encounter. Risk reduction counseling performed per USPSTF guidelines to reduce  obesity and cardiovascular risk factors.     Please refer to Patient Instructions for Blood Glucose Monitoring and Insulin/Medications Dosing Guide"  in media tab for additional information. Please  also refer to " Patient Self Inventory" in the  Media  tab for reviewed elements of pertinent patient history.  Army Fossa Flaten participated in the discussions, expressed understanding, and voiced agreement with the above plans.  All questions were answered to his satisfaction. he is encouraged to contact clinic should he have any questions or concerns prior to his return visit.   Follow up plan: - Return in about 4 months (around  12/19/2023) for F/U with Pre-visit Labs, Meter/CGM/Logs, A1c here.  Marquis Lunch, MD Phone: [phone]  Fax: [phone]   08/19/2023, 3:10 PM This note was partially dictated with voice recognition software. Similar sounding words can be transcribed inadequately or may not  be corrected upon review.

## 2023-08-19 NOTE — Patient Instructions (Addendum)
 Patient has to wear a glucose sensor on his arm synced with his watch  to monitor his glucose 24/7. This device and watch are  necessary for him to keep his diabetes in control.  Any assistance in this matter is appreciated.    Advice for Weight Management  -For most of [place] the best way to lose weight is by diet management. Generally speaking, diet management means consuming less calories intentionally which over time brings about progressive weight loss.  This can be achieved more effectively by avoiding ultra processed carbohydrates, processed meats, unhealthy fats.    It is critically important to know your numbers: how much calorie you are consuming and how much calorie you need. More importantly, our carbohydrates sources should be unprocessed naturally occurring  complex starch food items.  It is always important to balance nutrition also by  appropriate intake of proteins (mainly plant-based), healthy fats/oils, plenty of fruits and vegetables.   -The American College of Lifestyle Medicine (ACL M) recommends nutrition derived mostly from Whole Food, Plant Predominant Sources example an apple instead of applesauce or apple pie. Eat Plenty of vegetables, Mushrooms, fruits, Legumes, Whole Grains, Nuts, seeds in lieu of processed meats, processed snacks/pastries red meat, poultry, eggs.  Use only water or unsweetened tea for hydration.  The College also recommends the need to stay away from risky substances including alcohol, smoking; obtaining 7-9 hours of restorative sleep, at least 150 minutes of moderate intensity exercise weekly, importance of healthy social connections, and being mindful of stress and seek help when it is overwhelming.    -Sticking to a routine mealtime to eat 3 meals a day and avoiding unnecessary snacks is shown to have a big role in weight control. Under normal circumstances, the only time we burn  stored energy is when we are hungry, so allow  some hunger to take place- hunger means no food between appropriate meal times, only water.  It is not advisable to starve.   -It is better to avoid simple carbohydrates including: Cakes, Sweet Desserts, Ice Cream, Soda (diet and regular), Sweet Tea, Candies, Chips, Cookies, Store Bought Juices, Alcohol in Excess of  1-2 drinks a day, Lemonade,  Artificial Sweeteners, Doughnuts, Coffee Creamers, "Sugar-free" Products, etc, etc.  This is not a complete list...[name].    -Consulting with certified diabetes educators is proven to provide you with the most accurate and current information on diet.  Also, you may be  interested in discussing diet options/exchanges , we can schedule a visit with Norm Salt, RDN, CDE for individualized nutrition education.  -Exercise: If you are able: 30 -60 minutes a day ,4 days a week, or 150 minutes of moderate intensity exercise weekly.    The longer the better if tolerated.  Combine stretch, strength, and aerobic activities.  If you were told in the past that you have high risk for cardiovascular diseases, or if you are currently symptomatic, you may seek evaluation by your heart doctor prior to initiating moderate to intense exercise programs.                                  Additional Care Considerations for Diabetes/Prediabetes   -Diabetes  is a chronic disease.  The  most important care consideration is regular follow-up with your diabetes care provider with the goal being avoiding or delaying its complications and to take advantage of advances in medications and technology.  If appropriate actions are taken early enough, type 2 diabetes can even be reversed.  Seek information from the right source.  - Whole Food, Plant Predominant Nutrition is highly recommended: Eat Plenty of vegetables, Mushrooms, fruits, Legumes, Whole Grains, Nuts, seeds in lieu of processed meats, processed snacks/pastries red meat, poultry, eggs as  recommended by [employer] of  Lifestyle Medicine (ACLM).  -Type 2 diabetes is known to coexist with other important comorbidities such as high blood pressure and high cholesterol.  It is critical to control not only the diabetes but also the high blood pressure and high cholesterol to minimize and delay the risk of complications including coronary artery disease, stroke, amputations, blindness, etc.  The good news is that this diet recommendation for type 2 diabetes is also very helpful for managing high cholesterol and high blood blood pressure.  - Studies showed that people with diabetes will benefit from a class of medications known as ACE inhibitors and statins.  Unless there are specific reasons not to be on these medications, the standard of care is to consider getting one from these groups of medications at an optimal doses.  These medications are generally considered safe and proven to help protect the heart and the kidneys.    - People with diabetes are encouraged to initiate and maintain regular follow-up with eye doctors, foot doctors, dentists , and if necessary heart and kidney doctors.     - It is highly recommended that people with diabetes quit smoking or stay away from smoking, and get yearly  flu vaccine and pneumonia vaccine at least every 5 years.  See above for additional recommendations on exercise, sleep, stress management , and healthy social connections.

## 2023-08-24 ENCOUNTER — Ambulatory Visit: Payer: BC Managed Care – PPO | Admitting: "Endocrinology

## 2023-08-31 ENCOUNTER — Telehealth: Payer: Self-pay | Admitting: Cardiology

## 2023-08-31 NOTE — Telephone Encounter (Signed)
 Pt requesting cb ASAP to discuss FMLA paperwork that is due today.

## 2023-08-31 NOTE — Telephone Encounter (Signed)
 After reviewing the fax queue I called the patient and informed him that I did not see the FMLA paperwork from [employer]. He stated that he was going to call [employer] after we got off the phone.

## 2023-08-31 NOTE — Telephone Encounter (Signed)
 Called patient and he stated that his PCP had been doing his FMLA paperwork in the past but now since he is being seen by a cardiologist for a cardiac related event, his cardiac doctor would need to complete his FMLA paperwork. He stated that he had spoke to [employer] earlier and they were going to fax the FMLA paperwork to our office. Patient had no further questions at this time.

## 2023-09-10 DIAGNOSIS — G4730 Sleep apnea, unspecified: Secondary | ICD-10-CM | POA: Diagnosis not present

## 2023-11-08 ENCOUNTER — Other Ambulatory Visit: Payer: Self-pay | Admitting: "Endocrinology

## 2023-11-26 ENCOUNTER — Other Ambulatory Visit: Payer: Self-pay

## 2023-11-26 DIAGNOSIS — E1159 Type 2 diabetes mellitus with other circulatory complications: Secondary | ICD-10-CM

## 2023-11-26 MED ORDER — TOUJEO MAX SOLOSTAR 300 UNIT/ML ~~LOC~~ SOPN
90.0000 [IU] | PEN_INJECTOR | Freq: Every evening | SUBCUTANEOUS | 0 refills | Status: DC
Start: 2023-11-26 — End: 2024-01-05

## 2023-12-06 ENCOUNTER — Emergency Department (HOSPITAL_COMMUNITY): Payer: Self-pay

## 2023-12-06 ENCOUNTER — Other Ambulatory Visit: Payer: Self-pay

## 2023-12-06 ENCOUNTER — Emergency Department (HOSPITAL_COMMUNITY)
Admission: EM | Admit: 2023-12-06 | Discharge: 2023-12-06 | Disposition: A | Discharge status: Home / Self Care | Attending: Emergency Medicine | Admitting: Emergency Medicine

## 2023-12-06 ENCOUNTER — Encounter (HOSPITAL_COMMUNITY): Payer: Self-pay

## 2023-12-06 DIAGNOSIS — Z7984 Long term (current) use of oral hypoglycemic drugs: Secondary | ICD-10-CM | POA: Diagnosis not present

## 2023-12-06 DIAGNOSIS — I509 Heart failure, unspecified: Secondary | ICD-10-CM | POA: Diagnosis not present

## 2023-12-06 DIAGNOSIS — R079 Chest pain, unspecified: Secondary | ICD-10-CM | POA: Diagnosis not present

## 2023-12-06 DIAGNOSIS — I25110 Atherosclerotic heart disease of native coronary artery with unstable angina pectoris: Secondary | ICD-10-CM

## 2023-12-06 DIAGNOSIS — Z794 Long term (current) use of insulin: Secondary | ICD-10-CM | POA: Insufficient documentation

## 2023-12-06 DIAGNOSIS — I2510 Atherosclerotic heart disease of native coronary artery without angina pectoris: Secondary | ICD-10-CM | POA: Diagnosis not present

## 2023-12-06 DIAGNOSIS — Z7982 Long term (current) use of aspirin: Secondary | ICD-10-CM | POA: Diagnosis not present

## 2023-12-06 DIAGNOSIS — R0789 Other chest pain: Secondary | ICD-10-CM | POA: Diagnosis not present

## 2023-12-06 LAB — CBC WITH DIFFERENTIAL/PLATELET
Abs Immature Granulocytes: 0.02 K/uL (ref 0.00–0.07)
Basophils Absolute: 0 K/uL (ref 0.0–0.1)
Basophils Relative: 1 %
Eosinophils Absolute: 0.2 K/uL (ref 0.0–0.5)
Eosinophils Relative: 3 %
HCT: 44 % (ref 39.0–52.0)
Hemoglobin: 14.2 g/dL (ref 13.0–17.0)
Immature Granulocytes: 0 %
Lymphocytes Relative: 32 %
Lymphs Abs: 2.3 K/uL (ref 0.7–4.0)
MCH: 27.3 pg (ref 26.0–34.0)
MCHC: 32.3 g/dL (ref 30.0–36.0)
MCV: 84.5 fL (ref 80.0–100.0)
Monocytes Absolute: 0.5 K/uL (ref 0.1–1.0)
Monocytes Relative: 7 %
Neutro Abs: 4.1 K/uL (ref 1.7–7.7)
Neutrophils Relative %: 57 %
Platelets: 273 K/uL (ref 150–400)
RBC: 5.21 MIL/uL (ref 4.22–5.81)
RDW: 13.7 % (ref 11.5–15.5)
WBC: 7.2 K/uL (ref 4.0–10.5)
nRBC: 0 % (ref 0.0–0.2)

## 2023-12-06 LAB — COMPREHENSIVE METABOLIC PANEL WITH GFR
ALT: 26 U/L (ref 0–44)
AST: 21 U/L (ref 15–41)
Albumin: 3.9 g/dL (ref 3.5–5.0)
Alkaline Phosphatase: 77 U/L (ref 38–126)
Anion gap: 11 (ref 5–15)
BUN: 15 mg/dL (ref 6–20)
CO2: 26 mmol/L (ref 22–32)
Calcium: 8.7 mg/dL — ABNORMAL LOW (ref 8.9–10.3)
Chloride: 99 mmol/L (ref 98–111)
Creatinine, Ser: 0.98 mg/dL (ref 0.61–1.24)
GFR, Estimated: >60 mL/min (ref >60)
Glucose, Bld: 344 mg/dL — ABNORMAL HIGH (ref 70–99)
Potassium: 4 mmol/L (ref 3.5–5.1)
Sodium: 136 mmol/L (ref 135–145)
Total Bilirubin: 0.7 mg/dL (ref 0.0–1.2)
Total Protein: 7.6 g/dL (ref 6.5–8.1)

## 2023-12-06 LAB — BRAIN NATRIURETIC PEPTIDE: B Natriuretic Peptide: 9 pg/mL (ref 0.0–100.0)

## 2023-12-06 LAB — TROPONIN I (HIGH SENSITIVITY): Troponin I (High Sensitivity): 3 ng/L (ref <18)

## 2023-12-06 LAB — D-DIMER, QUANTITATIVE: D-Dimer, Quant: 0.28 ug{FEU}/mL (ref 0.00–0.50)

## 2023-12-06 NOTE — ED Notes (Signed)
 Pt states he has an endocrinologist appointment at the end of the month and states the doctor wants his lipids checked here if possible.
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
## 2023-12-06 NOTE — Discharge Instructions (Signed)
 Were seen in the emergency department for some right-sided chest pain.  You had blood work EKG and chest x-ray that did not show an obvious explanation for your symptoms.  This is possibly muscular and you can try Tylenol  and ibuprofen .  I am putting a referral in for you to follow-up with our cardiology department.  Return if any worsening or concerning symptoms

## 2023-12-06 NOTE — ED Triage Notes (Signed)
 Pt arrived via POV c/o right side chest pain that radiates down his arm and radiates to his back. Pt reports pain began apprx 1 week ago. Pt does endorse Hx of cardiac problems.

## 2023-12-06 NOTE — ED Notes (Signed)
 Pt ambulated w/o oxygen , Pts O2 sat was at lowest 90%, pt had no complaints of sob, pt is in bed now, on 3 lit of O2. Pt O2 is 93% on 3 lit.

## 2023-12-09 ENCOUNTER — Encounter: Payer: Self-pay | Admitting: Cardiology

## 2023-12-09 ENCOUNTER — Other Ambulatory Visit: Payer: Self-pay | Admitting: Cardiology

## 2023-12-09 ENCOUNTER — Ambulatory Visit: Attending: Cardiology | Admitting: Cardiology

## 2023-12-09 VITALS — BP 137/87 | HR 103 | Ht 70.0 in | Wt 317.8 lb

## 2023-12-09 DIAGNOSIS — E782 Mixed hyperlipidemia: Secondary | ICD-10-CM | POA: Diagnosis not present

## 2023-12-09 DIAGNOSIS — I25110 Atherosclerotic heart disease of native coronary artery with unstable angina pectoris: Secondary | ICD-10-CM

## 2023-12-09 DIAGNOSIS — I10 Essential (primary) hypertension: Secondary | ICD-10-CM | POA: Diagnosis not present

## 2023-12-09 DIAGNOSIS — G4733 Obstructive sleep apnea (adult) (pediatric): Secondary | ICD-10-CM | POA: Diagnosis not present
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
## 2023-12-09 NOTE — Patient Instructions (Signed)
 Medication Instructions:  Your physician recommends that you continue on your current medications as directed. Please refer to the Current Medication list given to you today.  *If you need a refill on your cardiac medications before your next appointment, please call your pharmacy*   Lab Work: None ordered If you have labs (blood work) drawn today and your tests are completely normal, you will receive your results only by: MyChart Message (if you have MyChart) OR A paper copy in the mail If you have any lab test that is abnormal or we need to change your treatment, we will call you to review the results.   Testing/Procedures:   [facility] Cardiovascular Imaging at [facility] [address] [place], [place] [address] Phone: [phone]  December 09, 2023    Joel Ward DOB: 03/20/1966 MRN: [id number] [address] [place] [address]   Dear Joel Ward,  Please arrive 15 minutes prior to your appointment time for registration and insurance purposes.  The test will take approximately 3 to 4 hours to complete; you may bring reading material.  If someone comes with you to your appointment, they will need to remain in the main lobby due to limited space in the testing area. **If you are pregnant or breastfeeding, please notify the nuclear lab prior to your appointment**  How to prepare for your Myocardial Perfusion Test: Do not eat or drink 3 hours prior to your test, except you may have water . Do not consume products containing caffeine (regular or decaffeinated) 12 hours prior to your test. (ex: coffee, chocolate, sodas, tea). Do bring a list of your current medications with you.  If not listed below, you may take your medications as normal. Do not take carvedilol  (Coreg ) for 24 hours prior to the test.  Bring the medication to your appointment as you may be required to take it once the test is complete. Do wear comfortable clothes (no dresses or overalls) and  walking shoes, tennis shoes preferred (No heels or open toe shoes are allowed). Do NOT wear cologne, perfume, aftershave, or lotions (deodorant is allowed). If these instructions are not followed, your test will have to be rescheduled.  Please report to [address] ([facility]), 2nd Floor, for your test.  If you have questions or concerns about your appointment, you can call the Nuclear Lab at [phone].  If you cannot keep your appointment, please provide 24 hours notification to the Nuclear Lab, to avoid a possible $50 charge to your account.   Follow-Up: At [facility], you and your health needs are our priority.  As part of our continuing mission to provide you with exceptional heart care, we have created designated Provider Care Teams.  These Care Teams include your primary Cardiologist (physician) and Advanced Practice Providers (APPs -  Physician Assistants and Nurse Practitioners) who all work together to provide you with the care you need, when you need it.  We recommend signing up for the patient portal called MyChart.  Sign up information is provided on this After Visit Summary.  MyChart is used to connect with patients for Virtual Visits (Telemedicine).  Patients are able to view lab/test results, encounter notes, upcoming appointments, etc.  Non-urgent messages can be sent to your provider as well.   To learn more about what you can do with MyChart, go to [email or web address].    Your next appointment:   9 month(s)  The format for your next  appointment:   In Person  Provider:   Jennifer Crape, MD    Other Instructions none  Important Information About Sugar

## 2023-12-10 ENCOUNTER — Encounter (HOSPITAL_COMMUNITY): Payer: Self-pay

## 2023-12-10 ENCOUNTER — Telehealth (HOSPITAL_COMMUNITY): Payer: Self-pay | Admitting: *Deleted

## 2023-12-10 NOTE — Telephone Encounter (Signed)
 Patient given detailed instructions per Myocardial Perfusion Study Information Sheet for the test on 12/14/2023 at 7:45. Patient notified to arrive 15 minutes early and that it is imperative to arrive on time for appointment to keep from having the test rescheduled.  If you need to cancel or reschedule your appointment, please call the office within 24 hours of your appointment. . Patient verbalized understanding.Joel Ward

## 2023-12-14 ENCOUNTER — Other Ambulatory Visit: Payer: Self-pay

## 2023-12-14 ENCOUNTER — Ambulatory Visit: Payer: Self-pay | Admitting: Cardiology

## 2023-12-14 ENCOUNTER — Ambulatory Visit (HOSPITAL_COMMUNITY)
Admission: RE | Admit: 2023-12-14 | Discharge: 2023-12-14 | Disposition: A | Discharge status: Home / Self Care | Source: Ambulatory Visit | Attending: Internal Medicine | Admitting: Internal Medicine

## 2023-12-14 DIAGNOSIS — I25110 Atherosclerotic heart disease of native coronary artery with unstable angina pectoris: Secondary | ICD-10-CM | POA: Insufficient documentation

## 2023-12-14 DIAGNOSIS — E1159 Type 2 diabetes mellitus with other circulatory complications: Secondary | ICD-10-CM

## 2023-12-14 LAB — MYOCARDIAL PERFUSION IMAGING
Angina Index: 0
Duke Treadmill Score: 5
Estimated workload: 7
Exercise duration (min): 5 min
LV dias vol: 121 mL (ref 62–150)
LV sys vol: 60 mL (ref 4.2–5.8)
MPHR: 165 {beats}/min
Nuc Stress EF: 50 %
Peak HR: 151 {beats}/min
Percent HR: 91 %
Rest HR: 77 {beats}/min
Rest Nuclear Isotope Dose: 13.1 mCi
SDS: 0
SRS: 0
SSS: 0
ST Depression (mm): 0 mm
Stress Nuclear Isotope Dose: 38 mCi
TID: 0.87

## 2023-12-14 MED ORDER — TECHNETIUM TC 99M TETROFOSMIN IV KIT
38.0000 | PACK | Freq: Once | INTRAVENOUS | Status: AC | PRN
  Administered 2023-12-14: 38 via INTRAVENOUS

## 2023-12-14 MED ORDER — REGADENOSON 0.4 MG/5ML IV SOLN: 0.4000 mg | Freq: Once | INTRAVENOUS | Status: DC

## 2023-12-14 MED ORDER — DAPAGLIFLOZIN PROPANEDIOL 10 MG PO TABS: 10.0000 mg | ORAL_TABLET | Freq: Every day | ORAL | 0 refills | Status: DC

## 2023-12-14 MED ORDER — TECHNETIUM TC 99M TETROFOSMIN IV KIT
13.1000 | PACK | Freq: Once | INTRAVENOUS | Status: AC | PRN
Start: 2023-12-14 — End: 2023-12-14
  Administered 2023-12-14: 13.1 via INTRAVENOUS

## 2023-12-14 MED ORDER — TECHNETIUM TC 99M TETROFOSMIN IV KIT: 945.0000 | PACK | Freq: Once | INTRAVENOUS | Status: DC | PRN

## 2023-12-20 ENCOUNTER — Telehealth: Payer: Self-pay | Admitting: Cardiology

## 2023-12-20 NOTE — Telephone Encounter (Signed)
 Joel Ward w/ [facility] calling to make Dr Edwyna aware of  MYOCARDIAL PERFUSION/CT RAD READ report in and ready to view

## 2023-12-21 ENCOUNTER — Ambulatory Visit: Payer: Self-pay | Admitting: Cardiology

## 2023-12-27 DIAGNOSIS — E1159 Type 2 diabetes mellitus with other circulatory complications: Secondary | ICD-10-CM | POA: Diagnosis not present

## 2023-12-28 LAB — COMPREHENSIVE METABOLIC PANEL WITH GFR
ALT: 34 IU/L (ref 0–44)
AST: 28 IU/L (ref 0–40)
Albumin: 4.3 g/dL (ref 3.8–4.9)
Alkaline Phosphatase: 83 IU/L (ref 44–121)
BUN/Creatinine Ratio: 12 (ref 9–20)
BUN: 12 mg/dL (ref 6–24)
Bilirubin Total: 0.8 mg/dL (ref 0.0–1.2)
CO2: 25 mmol/L (ref 20–29)
Calcium: 9.3 mg/dL (ref 8.7–10.2)
Chloride: 103 mmol/L (ref 96–106)
Creatinine, Ser: 1.03 mg/dL (ref 0.76–1.27)
Globulin, Total: 2.8 g/dL (ref 1.5–4.5)
Glucose: 195 mg/dL — ABNORMAL HIGH (ref 70–99)
Potassium: 4.1 mmol/L (ref 3.5–5.2)
Sodium: 140 mmol/L (ref 134–144)
Total Protein: 7.1 g/dL (ref 6.0–8.5)
eGFR: 85 mL/min/1.73 (ref >59)

## 2023-12-28 LAB — LIPID PANEL
Chol/HDL Ratio: 3.2 ratio (ref 0.0–5.0)
Cholesterol, Total: 154 mg/dL (ref 100–199)
HDL: 48 mg/dL (ref >39)
LDL Chol Calc (NIH): 90 mg/dL (ref 0–99)
Triglycerides: 85 mg/dL (ref 0–149)
VLDL Cholesterol Cal: 16 mg/dL (ref 5–40)

## 2023-12-30 ENCOUNTER — Ambulatory Visit: Admitting: "Endocrinology

## 2024-01-05 ENCOUNTER — Encounter: Payer: Self-pay | Admitting: "Endocrinology

## 2024-01-05 ENCOUNTER — Ambulatory Visit: Admitting: "Endocrinology

## 2024-01-05 VITALS — BP 124/84 | HR 64 | Ht 70.0 in | Wt 315.6 lb

## 2024-01-05 DIAGNOSIS — E559 Vitamin D deficiency, unspecified: Secondary | ICD-10-CM

## 2024-01-05 DIAGNOSIS — Z794 Long term (current) use of insulin: Secondary | ICD-10-CM | POA: Diagnosis not present

## 2024-01-05 DIAGNOSIS — I10 Essential (primary) hypertension: Secondary | ICD-10-CM

## 2024-01-05 DIAGNOSIS — E782 Mixed hyperlipidemia: Secondary | ICD-10-CM

## 2024-01-05 DIAGNOSIS — E1159 Type 2 diabetes mellitus with other circulatory complications: Secondary | ICD-10-CM

## 2024-01-05 LAB — POCT UA - MICROALBUMIN
Albumin/Creatinine Ratio, Urine, POC: <30
Creatinine, POC: 200 mg/dL
Microalbumin Ur, POC: 30 mg/L

## 2024-01-05 LAB — POCT GLYCOSYLATED HEMOGLOBIN (HGB A1C): HbA1c, POC (controlled diabetic range): 9.5 % — AB (ref 0.0–7.0)

## 2024-01-05 MED ORDER — TIRZEPATIDE 2.5 MG/0.5ML ~~LOC~~ SOAJ: 2.5000 mg | SUBCUTANEOUS | 0 refills | Status: DC

## 2024-01-05 MED ORDER — TOUJEO MAX SOLOSTAR 300 UNIT/ML ~~LOC~~ SOPN: 100.0000 [IU] | PEN_INJECTOR | Freq: Every day | SUBCUTANEOUS | 1 refills | Status: DC

## 2024-01-05 NOTE — Patient Instructions (Signed)

## 2024-01-05 NOTE — Progress Notes (Signed)
 01/05/2024   Endocrinology follow-up note   Subjective:    [name], male    DOB: 08-30-1965.  he is being seen in follow-up after he was seen in consultation for  management of currently uncontrolled symptomatic uncontrolled type 2 diabetes, hyperlipidemia, hypertension.  PMD:   Bertell Satterfield, MD.   Past Medical History:  Diagnosis Date   (HFpEF)/chronic diastolic dysfunction CHF 01/09/2023   Acute exacerbation of CHF (congestive heart failure) (HCC) 01/10/2023   Acute respiratory failure with hypoxia (HCC) 01/09/2023   CAD (coronary artery disease), native coronary artery    10/18 PCI/DES mLAD, normal EF on LV gram   Coronary artery disease involving native coronary artery of native heart with unstable angina pectoris (HCC) 03/16/2017   Diabetes mellitus    Type 2 diagnosed 2 weeks ago   DM type 2 causing vascular disease (HCC) 06/24/2011   Type 2 x 2 weeks     Dysphagia 06/24/2011   IMO SNOMED Dx Update Oct 2024     Essential hypertension, benign 03/24/2017   Hiatal hernia with gastroesophageal reflux    Hypertension    Mixed hyperlipidemia    Morbid obesity (HCC)    Multinodular goiter 07/08/2017   Nocturia more than twice per night 01/22/2015   OSA (obstructive sleep apnea) 01/09/2023   OSA on CPAP    Persistent circadian rhythm sleep disorder, 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  Date of injury January 03, 2012     Urinary frequency    Vitamin D  deficiency 03/03/2018   Past Surgical History:  Procedure Laterality Date   CIRCUMCISION     1988   COLONOSCOPY  07/06/2022   CORONARY STENT INTERVENTION N/A 03/16/2017   Procedure: CORONARY STENT INTERVENTION;  Surgeon: Claudene Victory ORN, MD;  Location: [facility];  Service: Cardiovascular;  Laterality: N/A;   ESOPHAGEAL DILATION N/A 02/08/2015   Procedure: ESOPHAGEAL DILATION;  Surgeon: Claudis RAYMOND Rivet, MD;  Location: [facility]  END[facility];  Service: Endoscopy;  Laterality: N/A;   ESOPHAGOGASTRODUODENOSCOPY N/A 02/08/2015   Procedure: ESOPHAGOGASTRODUODENOSCOPY (EGD);  Surgeon: Claudis RAYMOND Rivet, MD;  Location: [facility];  Service: Endoscopy;  Laterality: N/A;  1055   ESOPHAGOGASTRODUODENOSCOPY (EGD) WITH ESOPHAGEAL DILATION  03/29/2012   Procedure: ESOPHAGOGASTRODUODENOSCOPY (EGD) WITH ESOPHAGEAL DILATION;  Surgeon: Claudis RAYMOND Rivet, MD;  Location: [facility];  Service: Endoscopy;  Laterality: N/A;  730   LEFT HEART CATH AND CORONARY ANGIOGRAPHY N/A 03/16/2017   Procedure: LEFT HEART CATH AND CORONARY ANGIOGRAPHY;  Surgeon: Claudene Victory ORN, MD;  Location: [facility];  Service: Cardiovascular;  Laterality: N/A;   ULTRASOUND GUIDANCE FOR VASCULAR ACCESS  03/16/2017   Procedure: Ultrasound Guidance For Vascular Access;  Surgeon: Claudene Victory ORN, MD;  Location: [facility] INVAS[facility];  Service: Cardiovascular;;   Social History   Socioeconomic History   Marital status: Married    Spouse name: Not on file   Number of children: Not on file   Years of education: Not on file   Highest education level: Not on file  Occupational History   Not on file  Tobacco Use   Smoking status: Never   Smokeless tobacco: Never  Vaping Use   Vaping status: Never Used  Substance and Sexual Activity   Alcohol use: No   Drug use: No   Sexual activity: Yes  Other Topics Concern   Not on file  Social History  Narrative   Drinks caffeine 4 times a week.      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   Social Drivers of Corporate investment banker Strain: Low Risk  (01/16/2022)   Overall Financial Resource Strain (CARDIA)    Difficulty of Paying Living Expenses: Not hard at all  Food Insecurity: No Food Insecurity (01/09/2023)   Hunger Vital Sign    Worried About Running Out of Food in the Last Year: Never true    Ran Out of Food in the Last Year: Never true  Transportation Needs: No Transportation Needs (01/09/2023)   PRAPARE -  Administrator, Civil Service (Medical): No    Lack of Transportation (Non-Medical): No  Physical Activity: Not on file  Stress: Not on file  Social Connections: Not on file   Outpatient Encounter Medications as of 01/05/2024  Medication Sig   tirzepatide  (MOUNJARO ) 2.5 MG/0.5ML Pen Inject 2.5 mg into the skin once a week.   aspirin  EC 81 MG tablet Take 1 tablet (81 mg total) by mouth daily with breakfast.   atorvastatin  (LIPITOR) 40 MG tablet Take 1 tablet (40 mg total) by mouth daily.   carvedilol  (COREG ) 3.125 MG tablet Take 1 tablet (3.125 mg total) by mouth 2 (two) times daily. For BP   Continuous Glucose Sensor (FREESTYLE LIBRE 3 PLUS SENSOR) MISC CHANGE SENSOR EVERY 15 DAYS.   dapagliflozin  propanediol (FARXIGA ) 10 MG TABS tablet Take 1 tablet (10 mg total) by mouth daily before breakfast.   dextromethorphan -guaiFENesin  (MUCINEX  DM) 30-600 MG 12hr tablet Take 1 tablet by mouth 2 (two) times daily.   esomeprazole (NEXIUM) 20 MG capsule Take 20 mg by mouth daily at 12 noon.   furosemide  (LASIX ) 20 MG tablet Take 1 tablet (20 mg total) by mouth daily.   insulin  glargine, 2 Unit Dial , (TOUJEO  MAX SOLOSTAR) 300 UNIT/ML Solostar Pen Inject 100 Units into the skin at bedtime.   Insulin  Pen Needle (B-D ULTRAFINE III SHORT PEN) 31G X 8 MM MISC USE 1 PEN NEEDLE DAILY AS DIRECTED   lisinopril  (ZESTRIL ) 20 MG tablet Take 1 tablet (20 mg total) by mouth daily.   Melatonin 2.5 MG CAPS Take 2.5 mg by mouth at bedtime as needed (sleep).   metFORMIN  (GLUCOPHAGE -XR) 500 MG 24 hr tablet Take 1 tablet (500 mg total) by mouth daily with breakfast.   nitroGLYCERIN  (NITROSTAT ) 0.4 MG SL tablet Place 1 tablet (0.4 mg total) under the tongue every 5 (five) minutes as needed.   potassium chloride  (KLOR-CON ) 10 MEQ tablet Take 1 tablet (10 mEq total) by mouth daily. Take While taking Lasix /furosemide    sildenafil (VIAGRA) 100 MG tablet Take 100 mg by mouth as needed.   [DISCONTINUED] insulin  glargine,  2 Unit Dial , (TOUJEO  MAX SOLOSTAR) 300 UNIT/ML Solostar Pen Inject 90 Units into the skin at bedtime.   No facility-administered encounter medications on file as of 01/05/2024.    ALLERGIES: Allergies  Allergen Reactions   Bee Venom    Shellfish Allergy Swelling    Turns red   Strawberry Extract Rash    VACCINATION STATUS: Immunization History  Administered Date(s) Administered   Moderna Sars-Covid-2 Vaccination 08/24/2019, 09/27/2019   PNEUMOCOCCAL CONJUGATE-20 01/11/2023    Diabetes He presents for his follow-up diabetic visit. He has type 2 diabetes mellitus. Onset time: He reports that he was diagnosed at approximate age of 21 years. His disease course has been worsening. There are no hypoglycemic associated symptoms. Pertinent negatives for hypoglycemia include no confusion, headaches, pallor, seizures or  tremors. Pertinent negatives for diabetes include no blurred vision, no chest pain, no fatigue, no polydipsia, no polyphagia, no polyuria and no weakness. There are no hypoglycemic complications. Symptoms are worsening. Diabetic complications include heart disease. Risk factors for coronary artery disease include dyslipidemia, diabetes mellitus, family history, hypertension, male sex, obesity and sedentary lifestyle. Current diabetic treatment includes insulin  injections. His weight is increasing steadily (Overall achieved 38 pounds weight loss.). He is following a generally unhealthy diet. When asked about meal planning, he reported none. He has not had a previous visit with a dietitian. He participates in exercise intermittently. His home blood glucose trend is increasing steadily. His breakfast blood glucose range is generally >200 mg/dl. His lunch blood glucose range is generally >200 mg/dl. His dinner blood glucose range is generally >200 mg/dl. His bedtime blood glucose range is generally >200 mg/dl. His overall blood glucose range is >200 mg/dl. Joel Ward returns with significant  loss of control in his glycemia averaging 250 for the most recent 14 days.  His point-of-care A1c is 9.5% increasing from 8.1%.  His freestyle libre AGP shows 16% time in range, 40% Libre 1 hyperglycemia, 44% limited to hyperglycemia.  He has no hypoglycemia.     ) An ACE inhibitor/angiotensin II receptor blocker is being taken. He does not see a podiatrist.Eye exam is current.  Hyperlipidemia This is a chronic problem. The current episode started more than 1 year ago. The problem is controlled. Exacerbating diseases include diabetes and obesity. Pertinent negatives include no chest pain, myalgias or shortness of breath. Current antihyperlipidemic treatment includes statins. Risk factors for coronary artery disease include dyslipidemia, diabetes mellitus, hypertension, male sex, obesity, a sedentary lifestyle and family history.  Hypertension This is a chronic problem. The problem is controlled. Pertinent negatives include no blurred vision, chest pain, headaches, neck pain, palpitations or shortness of breath. Risk factors for coronary artery disease include diabetes mellitus, dyslipidemia, obesity, male gender, sedentary lifestyle and family history. Past treatments include ACE inhibitors. Hypertensive end-organ damage includes CAD/MI.     Review of systems Limited as above.   Objective:    BP 124/84   Pulse 64   Ht 5' 10 (1.778 m)   Wt (!) 315 lb 9.6 oz (143.2 kg)   BMI 45.28 kg/m   Wt Readings from Last 3 Encounters:  01/05/24 (!) 315 lb 9.6 oz (143.2 kg)  12/09/23 (!) 317 lb 12.8 oz (144.2 kg)  12/06/23 (!) 310 lb (140.6 kg)      Physical Exam- Limited  Constitutional:  Body mass index is 45.28 kg/m. , not in acute distress, normal state of mind   CMP ( most recent) CMP     Component Value Date/Time   NA 140 12/27/2023 1138   K 4.1 12/27/2023 1138   CL 103 12/27/2023 1138   CO2 25 12/27/2023 1138   GLUCOSE 195 (H) 12/27/2023 1138   GLUCOSE 344 (H) 12/06/2023 1230    BUN 12 12/27/2023 1138   CREATININE 1.03 12/27/2023 1138   CREATININE 0.99 04/15/2023 1020   CALCIUM  9.3 12/27/2023 1138   PROT 7.1 12/27/2023 1138   ALBUMIN 4.3 12/27/2023 1138   AST 28 12/27/2023 1138   ALT 34 12/27/2023 1138   ALKPHOS 83 12/27/2023 1138   BILITOT 0.8 12/27/2023 1138   GFRNONAA >60 12/06/2023 1230   GFRNONAA 95 10/12/2019 0833   GFRAA >60 01/20/2020 0556   GFRAA 110 10/12/2019 0833     Diabetic Labs (most recent): Lab Results  Component Value Date  HGBA1C 9.5 (A) 01/05/2024   HGBA1C 8.1 (A) 08/19/2023   HGBA1C 7.5 (H) 04/15/2023   MICROALBUR 30 01/05/2024   MICROALBUR 1.8 10/12/2019   MICROALBUR 0.5 03/01/2018     Lipid Panel ( most recent) Lipid Panel     Component Value Date/Time   CHOL 154 12/27/2023 1138   TRIG 85 12/27/2023 1138   HDL 48 12/27/2023 1138   CHOLHDL 3.2 12/27/2023 1138   CHOLHDL 3.0 10/12/2019 0833   VLDL 21 06/29/2017 0807   LDLCALC 90 12/27/2023 1138   LDLCALC 64 10/12/2019 0833     Assessment & Plan:   1. DM type 2 causing vascular disease (HCC) - Patient has currently uncontrolled symptomatic type 2 DM since  58 years of age.  Joel Ward returns with significant loss of control in his glycemia averaging 250 for the most recent 14 days.  His point-of-care A1c is 9.5% increasing from 8.1%.  His freestyle libre AGP shows 16% time in range, 40% Libre 1 hyperglycemia, 44% limited to hyperglycemia.  He has no hypoglycemia.     Recent labs reviewed with him showing normal renal function.  -his diabetes is complicated by coronary artery disease with stent placement on 03/15/2017 and Joel Ward remains at extremely  high risk for more acute and chronic complications which include CHF, obstructive sleep apnea, CAD, CVA, CKD, retinopathy, and neuropathy. These are all discussed in detail with the patient.  - I have counseled him on diet management and weight loss, by adopting a carbohydrate restricted/protein rich diet. -He  did admits to dietary indiscretions including consumption of sweets and sweetened beverages.     He is advised to reengage with lifestyle medicine.   - he acknowledges that there is a room for improvement in his food and drink choices. - Suggestion is made for him to avoid simple carbohydrates  from his diet including Cakes, Sweet Desserts, Ice Cream, Soda (diet and regular), Sweet Tea, Candies, Chips, Cookies, Store Bought Juices, Alcohol , Artificial Sweeteners,  Coffee Creamer, and Sugar-free Products, Lemonade. This will help patient to have more stable blood glucose profile and potentially avoid unintended weight gain.  The following Lifestyle Medicine recommendations according to American College of Lifestyle Medicine  [facility]) were discussed and and offered to patient and he  agrees to start the journey:  A. Whole Foods, Plant-Based Nutrition comprising of fruits and vegetables, plant-based proteins, whole-grain carbohydrates was discussed in detail with the patient.   A list for source of those nutrients were also provided to the patient.  Patient will use only water  or unsweetened tea for hydration. B.  The need to stay away from risky substances including alcohol, smoking; obtaining 7 to 9 hours of restorative sleep, at least 150 minutes of moderate intensity exercise weekly, the importance of healthy social connections,  and stress management techniques were discussed. C.  A full color page of  Calorie density of various food groups per pound showing examples of each food groups was provided to the patient.  - I encouraged him to switch to  unprocessed or minimally processed complex starch and increased protein intake (animal or plant source), fruits, and vegetables.  - he is advised to stick to a routine mealtimes to eat 3 meals  a day and avoid unnecessary snacks ( to snack only to correct hypoglycemia).   - he has been  scheduled with Penny Crumpton, RDN, CDE for individualized DM  education- consult pending.  - I have approached him with the following  individualized plan to manage diabetes and patient agrees:   -In light of his presentation with significant loss of control his glycemia, he was given options of either adding a GLP-1 receptor agonist or prandial insulin .  He accepted my recommendation of going on on GLP-1 receptor agonist.  I discussed and prescribed Mounjaro  2.5 mg subcutaneously weekly.  This medication will be advanced as he tolerates. - He is advised to increase Toujeo  to 100 units nightly associated with utilizing his CGM continuously.   -He is encouraged to call clinic for hypoglycemia below 70 and hyperglycemia above 200 mg/dL. -He is advised to continue Farxiga  10 mg p.o. daily at breakfast along with metformin  500 mg XR p.o. daily at breakfast.     2) BP/HTN: - His blood pressure is controlled to target.  He is advised to continue  lisinopril  to 20 mg p.o. daily at breakfast.    3) Lipids/HPL: His LDL is improving to 62.  He did not stay engaged with lifestyle medicine nutrition.  He is advised to continue atorvastatin  40 mg nightly.    He will have fasting lipid panel before his next visit.     4)  Weight/Diet: His current BMI is 45.28- He is  a candidate for modest weight loss. - He did not get the traction from bariatric surgery, advised to maximize his personal efforts first. I discussed with him the fact that he would benefit the most from loss of 5 - 10% of his current body weight .  He remains a good candidate for lifestyle medicine nutrition as discussed above.    5) vitamin D  deficiency:  He is status post treatment with vitamin D2 50,000 units weekly for several weeks.  He is advised to maintain with vitamin D3 5000 units daily.     6) Chronic Care/Health Maintenance:  -he  is on ACEI/ARB and Statin medications and  is encouraged to continue to follow up with Ophthalmology, Dentist,  Podiatrist at least yearly or according to  recommendations, and advised to  stay away from smoking. I have recommended yearly flu vaccine and pneumonia vaccination at least every 5 years; moderate intensity exercise for up to 150 minutes weekly; and  sleep for at least 7 hours a day.  He had negative ABI for PAD in April 2022.  His study will be repeated in April 2027, or sooner if needed.   - I advised patient to maintain close follow up with Bertell Satterfield, MD for primary care needs.  I spent  42  minutes in the care of the patient today including review of labs from CMP, Lipids, Thyroid  Function, Hematology (current and previous including abstractions from other facilities); face-to-face time discussing  his blood glucose readings/logs, discussing hypoglycemia and hyperglycemia episodes and symptoms, medications doses, his options of short and long term treatment based on the latest standards of care / guidelines;  discussion about incorporating lifestyle medicine;  and documenting the encounter. Risk reduction counseling performed per USPSTF guidelines to reduce obesity and cardiovascular risk factors.     Please refer to Patient Instructions for Blood Glucose Monitoring and Insulin /Medications Dosing Guide  in media tab for additional information. Please  also refer to  Patient Self Inventory in the Media  tab for reviewed elements of pertinent patient history.  Joel Ward participated in the discussions, expressed understanding, and voiced agreement with the above plans.  All questions were answered to his satisfaction. he is encouraged to contact clinic should he have any questions or  concerns prior to his return visit.    Follow up plan: - Return in about 4 months (around 05/06/2024) for Bring Meter/CGM Device/Logs- A1c in Office.  Joel Earl, MD Phone: [phone]  Fax: [phone]   01/05/2024, 5:40 PM This note was partially dictated with voice recognition software. Similar sounding words can be transcribed  inadequately or may not  be corrected upon review.

## 2024-01-06 ENCOUNTER — Telehealth: Payer: Self-pay

## 2024-01-06 NOTE — Telephone Encounter (Signed)
 Received notification from pt's insurance his Rx for Mounjaro  2.5mg  weekly needs a prior authorization. Stated it needs to go through [employer] at [phone].

## 2024-01-07 ENCOUNTER — Other Ambulatory Visit (HOSPITAL_COMMUNITY): Payer: Self-pay

## 2024-01-10 ENCOUNTER — Other Ambulatory Visit (HOSPITAL_COMMUNITY): Payer: Self-pay

## 2024-01-10 ENCOUNTER — Telehealth: Payer: Self-pay

## 2024-01-10 DIAGNOSIS — E1159 Type 2 diabetes mellitus with other circulatory complications: Secondary | ICD-10-CM

## 2024-01-10 NOTE — Telephone Encounter (Signed)
 Pharmacy Patient Advocate Encounter   Received notification from Pt Calls Messages that prior authorization for Mounjaro  2.5mg  is required/requested.   Insurance verification completed.   The patient is insured through [employer] .   Per test claim: PA required; PA submitted to above mentioned insurance via CoverMyMeds Key/confirmation #/EOC [id number] Status is pending

## 2024-01-25 DIAGNOSIS — M25511 Pain in right shoulder: Secondary | ICD-10-CM | POA: Diagnosis not present

## 2024-01-25 DIAGNOSIS — E6609 Other obesity due to excess calories: Secondary | ICD-10-CM | POA: Diagnosis not present

## 2024-01-25 DIAGNOSIS — Z6841 Body Mass Index (BMI) 40.0 and over, adult: Secondary | ICD-10-CM | POA: Diagnosis not present

## 2024-01-31 ENCOUNTER — Other Ambulatory Visit: Payer: Self-pay | Admitting: "Endocrinology

## 2024-02-02 NOTE — Telephone Encounter (Signed)
 Pt is stating that PA was denied for Mounjaro , and is requesting an appeal.

## 2024-02-07 ENCOUNTER — Other Ambulatory Visit (HOSPITAL_COMMUNITY): Payer: Self-pay

## 2024-02-07 NOTE — Telephone Encounter (Signed)
 Please Advise. Patient is still waiting.

## 2024-02-07 NOTE — Telephone Encounter (Signed)
 I don't see where we have received the denial letter yet- going through our faxes.  Once received, Our [facility] will submit appeal- It may take 3-5 days to prepare the necessary documentation to request the appeal from the insurance. We can request it to be expedited, but that is on the plan's discretion.

## 2024-02-07 NOTE — Telephone Encounter (Signed)
 RX refill was sent on 9/3 patient is calling states that he is out. Please Advice

## 2024-02-07 NOTE — Telephone Encounter (Signed)
 Called and spoke to insurance. I was first told that GLP-1s were an excluded benefit of his insurance and none would be covered. I called back to see if we could receive that in writing so the information could be provided to the clinic and patient. When I called back, a different rep answered the phone who told me he was denied due to not trying the max dose of metformin  (2GM/day). I asked for them to fax us  the denial letter. Denial letter has been added to the pt's media tab.   Denial letter states:    Information has been sent to clinical pharmacist for appeals review. It may take 5-7 days to prepare the necessary documentation to request the appeal from the insurance.

## 2024-02-08 MED ORDER — METFORMIN HCL ER 500 MG PO TB24: 1000.0000 mg | ORAL_TABLET | Freq: Every day | ORAL | 0 refills | Status: DC

## 2024-02-08 NOTE — Telephone Encounter (Signed)
 Pt agreed to increase his Metformin  to 1000mg  daily. Rx for Metformin  (Glucophage -XR) 500mg  2 tablets daily sent to [facility] in [place].

## 2024-02-08 NOTE — Telephone Encounter (Signed)
 See message below and follow up with pt please

## 2024-02-08 NOTE — Addendum Note (Signed)
 Addended by: CLAUDENE ZADA POUR on: 02/08/2024 04:29 PM   Modules accepted: Orders

## 2024-03-03 ENCOUNTER — Other Ambulatory Visit: Payer: Self-pay

## 2024-03-03 DIAGNOSIS — E1159 Type 2 diabetes mellitus with other circulatory complications: Secondary | ICD-10-CM

## 2024-03-03 MED ORDER — TOUJEO MAX SOLOSTAR 300 UNIT/ML ~~LOC~~ SOPN: 100.0000 [IU] | PEN_INJECTOR | Freq: Every day | SUBCUTANEOUS | 0 refills | Status: DC

## 2024-03-05 ENCOUNTER — Other Ambulatory Visit: Payer: Self-pay | Admitting: "Endocrinology

## 2024-03-05 DIAGNOSIS — E1159 Type 2 diabetes mellitus with other circulatory complications: Secondary | ICD-10-CM

## 2024-04-14 ENCOUNTER — Other Ambulatory Visit: Payer: Self-pay | Admitting: "Endocrinology

## 2024-04-25 ENCOUNTER — Other Ambulatory Visit: Payer: Self-pay | Admitting: "Endocrinology

## 2024-04-25 DIAGNOSIS — E1159 Type 2 diabetes mellitus with other circulatory complications: Secondary | ICD-10-CM

## 2024-04-26 DIAGNOSIS — M19011 Primary osteoarthritis, right shoulder: Secondary | ICD-10-CM | POA: Diagnosis not present

## 2024-04-26 DIAGNOSIS — Z23 Encounter for immunization: Secondary | ICD-10-CM | POA: Diagnosis not present

## 2024-05-08 ENCOUNTER — Ambulatory Visit: Admitting: "Endocrinology

## 2024-05-10 ENCOUNTER — Ambulatory Visit: Admitting: "Endocrinology

## 2024-05-18 ENCOUNTER — Ambulatory Visit: Payer: Self-pay | Admitting: "Endocrinology

## 2024-05-18 ENCOUNTER — Encounter: Payer: Self-pay | Admitting: "Endocrinology

## 2024-05-18 VITALS — BP 138/84 | HR 84 | Resp 18 | Ht 71.0 in | Wt 317.0 lb

## 2024-05-18 DIAGNOSIS — E782 Mixed hyperlipidemia: Secondary | ICD-10-CM

## 2024-05-18 DIAGNOSIS — E1159 Type 2 diabetes mellitus with other circulatory complications: Secondary | ICD-10-CM

## 2024-05-18 DIAGNOSIS — I10 Essential (primary) hypertension: Secondary | ICD-10-CM

## 2024-05-18 DIAGNOSIS — Z794 Long term (current) use of insulin: Secondary | ICD-10-CM | POA: Diagnosis not present

## 2024-05-18 LAB — POCT GLYCOSYLATED HEMOGLOBIN (HGB A1C): Hemoglobin A1C: 7.1 % — AB (ref 4.0–5.6)

## 2024-05-18 MED ORDER — OZEMPIC (0.25 OR 0.5 MG/DOSE) 2 MG/3ML ~~LOC~~ SOPN: 0.2500 mg | PEN_INJECTOR | SUBCUTANEOUS | 1 refills | Status: AC

## 2024-05-18 MED ORDER — METFORMIN HCL ER 500 MG PO TB24: 500.0000 mg | ORAL_TABLET | Freq: Every day | ORAL | 1 refills | Status: DC

## 2024-05-18 NOTE — Progress Notes (Signed)
 05/18/2024   Endocrinology follow-up note   Subjective:    [name], male    DOB: 12/06/1965.  he is being seen in follow-up after he was seen in consultation for  management of currently uncontrolled symptomatic uncontrolled type 2 diabetes, hyperlipidemia, hypertension.  PMD:   Marvine Rush, MD.   Past Medical History:  Diagnosis Date   (HFpEF)/chronic diastolic dysfunction CHF 01/09/2023   Acute exacerbation of CHF (congestive heart failure) (HCC) 01/10/2023   Acute respiratory failure with hypoxia (HCC) 01/09/2023   CAD (coronary artery disease), native coronary artery    10/18 PCI/DES mLAD, normal EF on LV gram   Coronary artery disease involving native coronary artery of native heart with unstable angina pectoris (HCC) 03/16/2017   Diabetes mellitus    Type 2 diagnosed 2 weeks ago   DM type 2 causing vascular disease (HCC) 06/24/2011   Type 2 x 2 weeks     Dysphagia 06/24/2011   IMO SNOMED Dx Update Oct 2024     Essential hypertension, benign 03/24/2017   Hiatal hernia with gastroesophageal reflux    Hypertension    Mixed hyperlipidemia    Morbid obesity (HCC)    Multinodular goiter 07/08/2017   Nocturia more than twice per night 01/22/2015   OSA (obstructive sleep apnea) 01/09/2023   OSA on CPAP    Persistent circadian rhythm sleep disorder, 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  Date of injury January 03, 2012     Urinary frequency    Vitamin D  deficiency 03/03/2018   Past Surgical History:  Procedure Laterality Date   CIRCUMCISION     1988   COLONOSCOPY  07/06/2022   CORONARY STENT INTERVENTION N/A 03/16/2017   Procedure: CORONARY STENT INTERVENTION;  Surgeon: Claudene Victory ORN, MD;  Location: [facility];  Service: Cardiovascular;  Laterality: N/A;   ESOPHAGEAL DILATION N/A 02/08/2015   Procedure: ESOPHAGEAL DILATION;  Surgeon: Claudis RAYMOND Rivet, MD;  Location: [facility]  END[facility];  Service: Endoscopy;  Laterality: N/A;   ESOPHAGOGASTRODUODENOSCOPY N/A 02/08/2015   Procedure: ESOPHAGOGASTRODUODENOSCOPY (EGD);  Surgeon: Claudis RAYMOND Rivet, MD;  Location: [facility];  Service: Endoscopy;  Laterality: N/A;  1055   ESOPHAGOGASTRODUODENOSCOPY (EGD) WITH ESOPHAGEAL DILATION  03/29/2012   Procedure: ESOPHAGOGASTRODUODENOSCOPY (EGD) WITH ESOPHAGEAL DILATION;  Surgeon: Claudis RAYMOND Rivet, MD;  Location: [facility];  Service: Endoscopy;  Laterality: N/A;  730   LEFT HEART CATH AND CORONARY ANGIOGRAPHY N/A 03/16/2017   Procedure: LEFT HEART CATH AND CORONARY ANGIOGRAPHY;  Surgeon: Claudene Victory ORN, MD;  Location: [facility];  Service: Cardiovascular;  Laterality: N/A;   ULTRASOUND GUIDANCE FOR VASCULAR ACCESS  03/16/2017   Procedure: Ultrasound Guidance For Vascular Access;  Surgeon: Claudene Victory ORN, MD;  Location: [facility] INVASIVE CV LAB;  Service: Cardiovascular;;   Social History   Socioeconomic History   Marital status: Married    Spouse name: Not on file   Number of children: Not on file   Years of education: Not on file   Highest education level: Not on file  Occupational History   Not on file  Tobacco Use   Smoking status: Never   Smokeless tobacco: Never  Vaping Use   Vaping status: Never Used  Substance and Sexual Activity   Alcohol use: No   Drug use: No   Sexual activity: Yes  Other Topics Concern   Not on file  Social History  Narrative   Drinks caffeine 4 times a week.      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   Social Drivers of Health   Tobacco Use: Low Risk (05/18/2024)   Patient History    Smoking Tobacco Use: Never    Smokeless Tobacco Use: Never    Passive Exposure: Not on file  Financial Resource Strain: Low Risk (01/16/2022)   Overall Financial Resource Strain (CARDIA)    Difficulty of Paying Living Expenses: Not hard at all  Food Insecurity: No Food Insecurity (01/09/2023)   Hunger Vital Sign    Worried About Running Out of Food in the  Last Year: Never true    Ran Out of Food in the Last Year: Never true  Transportation Needs: No Transportation Needs (01/09/2023)   PRAPARE - Administrator, Civil Service (Medical): No    Lack of Transportation (Non-Medical): No  Physical Activity: Not on file  Stress: Not on file  Social Connections: Not on file  Depression (EYV7-0): Not on file  Alcohol Screen: Not on file  Housing: Low Risk (01/09/2023)   Housing    Last Housing Risk Score: 0  Utilities: Not At Risk (01/09/2023)   AHC Utilities    Threatened with loss of utilities: No  Health Literacy: Not on file   Outpatient Encounter Medications as of 05/18/2024  Medication Sig   atorvastatin  (LIPITOR) 40 MG tablet Take 1 tablet (40 mg total) by mouth daily.   carvedilol  (COREG ) 3.125 MG tablet Take 1 tablet (3.125 mg total) by mouth 2 (two) times daily. For BP   celecoxib (CELEBREX) 200 MG capsule 1 capsule as needed Orally twice a day; Duration: 30 days   Continuous Glucose Sensor (FREESTYLE LIBRE 3 PLUS SENSOR) MISC USE TO CHECK GLUCOSE CONTINUOUSLY CHANGING SENSOR EVERY 15 DAYS   dextromethorphan -guaiFENesin  (MUCINEX  DM) 30-600 MG 12hr tablet Take 1 tablet by mouth 2 (two) times daily.   esomeprazole (NEXIUM) 20 MG capsule Take 20 mg by mouth daily at 12 noon.   FARXIGA  10 MG TABS tablet TAKE 1 TABLET BY MOUTH ONCE DAILY BEFORE BREAKFAST   furosemide  (LASIX ) 20 MG tablet Take 1 tablet (20 mg total) by mouth daily.   insulin  glargine, 2 Unit Dial , (TOUJEO  MAX SOLOSTAR) 300 UNIT/ML Solostar Pen Inject 100 Units into the skin at bedtime.   Insulin  Pen Needle (B-D ULTRAFINE III SHORT PEN) 31G X 8 MM MISC USE 1 PEN NEEDLE DAILY AS DIRECTED   lisinopril  (ZESTRIL ) 20 MG tablet Take 1 tablet (20 mg total) by mouth daily.   Melatonin 2.5 MG CAPS Take 2.5 mg by mouth at bedtime as needed (sleep).   nitroGLYCERIN  (NITROSTAT ) 0.4 MG SL tablet Place 1 tablet (0.4 mg total) under the tongue every 5 (five) minutes as needed.    potassium chloride  (KLOR-CON ) 10 MEQ tablet Take 1 tablet (10 mEq total) by mouth daily. Take While taking Lasix /furosemide    Semaglutide ,0.25 or 0.5MG /DOS, (OZEMPIC , 0.25 OR 0.5 MG/DOSE,) 2 MG/3ML SOPN Inject 0.25 mg into the skin once a week.   sildenafil (VIAGRA) 100 MG tablet Take 100 mg by mouth as needed.   [DISCONTINUED] metFORMIN  (GLUCOPHAGE -XR) 500 MG 24 hr tablet TAKE 2 TABLETS BY MOUTH ONCE DAILY WITH BREAKFAST   metFORMIN  (GLUCOPHAGE -XR) 500 MG 24 hr tablet Take 1 tablet (500 mg total) by mouth daily with breakfast.   [DISCONTINUED] tirzepatide  (MOUNJARO ) 2.5 MG/0.5ML Pen Inject 2.5 mg into the skin once a week. (Patient not taking: Reported on 05/18/2024)   No facility-administered encounter medications  on file as of 05/18/2024.    ALLERGIES: Allergies  Allergen Reactions   Bee Venom    Shellfish Allergy Swelling    Turns red   Strawberry Extract Rash    VACCINATION STATUS: Immunization History  Administered Date(s) Administered   Moderna Sars-Covid-2 Vaccination 08/24/2019, 09/27/2019   PNEUMOCOCCAL CONJUGATE-20 01/11/2023    Diabetes He presents for his follow-up diabetic visit. He has type 2 diabetes mellitus. Onset time: He reports that he was diagnosed at approximate age of 77 years. His disease course has been improving. There are no hypoglycemic associated symptoms. Pertinent negatives for hypoglycemia include no confusion, headaches, pallor, seizures or tremors. Pertinent negatives for diabetes include no blurred vision, no chest pain, no fatigue, no polydipsia, no polyphagia, no polyuria and no weakness. There are no hypoglycemic complications. Symptoms are improving. Diabetic complications include heart disease. Risk factors for coronary artery disease include dyslipidemia, diabetes mellitus, family history, hypertension, male sex, obesity and sedentary lifestyle. Current diabetic treatment includes insulin  injections. His weight is fluctuating minimally (Overall  achieved 38 pounds weight loss.). He is following a generally unhealthy diet. When asked about meal planning, he reported none. He has not had a previous visit with a dietitian. He participates in exercise intermittently. His home blood glucose trend is decreasing steadily. His breakfast blood glucose range is generally 140-180 mg/dl. His lunch blood glucose range is generally 140-180 mg/dl. His dinner blood glucose range is generally 140-180 mg/dl. His bedtime blood glucose range is generally 140-180 mg/dl. His overall blood glucose range is 140-180 mg/dl. Joel Ward returns with significant improvement in his glycemic profile with his AGP showing 62% time in range, 35% level 1 hyperglycemia, 3% level 2 hyperglycemia.  He has no hypoglycemia.  His point-of-care A1c is 7.1% improving from 9.5% during his last visit.  His current average blood glucose is 166 mg per DL with 73.1% glucose variability.  His insurance did not provide coverage for Mounjaro  prescribed during his last visit.     ) An ACE inhibitor/angiotensin II receptor blocker is being taken. He does not see a podiatrist.Eye exam is current.  Hyperlipidemia This is a chronic problem. The current episode started more than 1 year ago. The problem is controlled. Exacerbating diseases include diabetes and obesity. Pertinent negatives include no chest pain, myalgias or shortness of breath. Current antihyperlipidemic treatment includes statins. Risk factors for coronary artery disease include dyslipidemia, diabetes mellitus, hypertension, male sex, obesity, a sedentary lifestyle and family history.  Hypertension This is a chronic problem. The problem is controlled. Pertinent negatives include no blurred vision, chest pain, headaches, neck pain, palpitations or shortness of breath. Risk factors for coronary artery disease include diabetes mellitus, dyslipidemia, obesity, male gender, sedentary lifestyle and family history. Past treatments include ACE  inhibitors. Hypertensive end-organ damage includes CAD/MI.    Review of systems Limited as above.   Objective:    BP 138/84   Pulse 84   Resp 18   Ht 5' 11 (1.803 m)   Wt (!) 317 lb (143.8 kg)   SpO2 98%   BMI 44.21 kg/m   Wt Readings from Last 3 Encounters:  05/18/24 (!) 317 lb (143.8 kg)  01/05/24 (!) 315 lb 9.6 oz (143.2 kg)  12/09/23 (!) 317 lb 12.8 oz (144.2 kg)      Physical Exam- Limited  Constitutional:  Body mass index is 44.21 kg/m. , not in acute distress, normal state of mind   CMP ( most recent) CMP     Component Value Date/Time  NA 140 12/27/2023 1138   K 4.1 12/27/2023 1138   CL 103 12/27/2023 1138   CO2 25 12/27/2023 1138   GLUCOSE 195 (H) 12/27/2023 1138   GLUCOSE 344 (H) 12/06/2023 1230   BUN 12 12/27/2023 1138   CREATININE 1.03 12/27/2023 1138   CREATININE 0.99 04/15/2023 1020   CALCIUM  9.3 12/27/2023 1138   PROT 7.1 12/27/2023 1138   ALBUMIN 4.3 12/27/2023 1138   AST 28 12/27/2023 1138   ALT 34 12/27/2023 1138   ALKPHOS 83 12/27/2023 1138   BILITOT 0.8 12/27/2023 1138   GFRNONAA >60 12/06/2023 1230   GFRNONAA 95 10/12/2019 0833   GFRAA >60 01/20/2020 0556   GFRAA 110 10/12/2019 0833     Diabetic Labs (most recent): Lab Results  Component Value Date   HGBA1C 7.1 (A) 05/18/2024   HGBA1C 9.5 (A) 01/05/2024   HGBA1C 8.1 (A) 08/19/2023   MICROALBUR 30 01/05/2024   MICROALBUR 1.8 10/12/2019   MICROALBUR 0.5 03/01/2018     Lipid Panel ( most recent) Lipid Panel     Component Value Date/Time   CHOL 154 12/27/2023 1138   TRIG 85 12/27/2023 1138   HDL 48 12/27/2023 1138   CHOLHDL 3.2 12/27/2023 1138   CHOLHDL 3.0 10/12/2019 0833   VLDL 21 06/29/2017 0807   LDLCALC 90 12/27/2023 1138   LDLCALC 64 10/12/2019 0833     Assessment & Plan:   1. DM type 2 causing vascular disease (HCC) - Patient has currently uncontrolled symptomatic type 2 DM since  58 years of age.  Joel Ward returns with significant improvement in his  glycemic profile with his AGP showing 62% time in range, 35% level 1 hyperglycemia, 3% level 2 hyperglycemia.  He has no hypoglycemia.  His point-of-care A1c is 7.1% improving from 9.5% during his last visit.  His current average blood glucose is 166 mg per DL with 73.1% glucose variability.  His insurance did not provide coverage for Mounjaro  prescribed during his last visit.     Recent labs reviewed with him showing normal renal function.  -his diabetes is complicated by coronary artery disease with stent placement on 03/15/2017 and Joel Ward remains at extremely  high risk for more acute and chronic complications which include CHF, obstructive sleep apnea, CAD, CVA, CKD, retinopathy, and neuropathy. These are all discussed in detail with the patient.  - I have counseled him on diet management and weight loss, by adopting a carbohydrate restricted/protein rich diet. -He did admits to dietary indiscretions including consumption of sweets and sweetened beverages.     He is advised to reengage with lifestyle medicine.   - he acknowledges that there is a room for improvement in his food and drink choices. - Suggestion is made for him to avoid simple carbohydrates  from his diet including Cakes, Sweet Desserts, Ice Cream, Soda (diet and regular), Sweet Tea, Candies, Chips, Cookies, Store Bought Juices, Alcohol , Artificial Sweeteners,  Coffee Creamer, and Sugar-free Products, Lemonade. This will help patient to have more stable blood glucose profile and potentially avoid unintended weight gain.  The following Lifestyle Medicine recommendations according to American College of Lifestyle Medicine  [facility]) were discussed and and offered to patient and he  agrees to start the journey:  A. Whole Foods, Plant-Based Nutrition comprising of fruits and vegetables, plant-based proteins, whole-grain carbohydrates was discussed in detail with the patient.   A list for source of those nutrients were also  provided to the patient.  Patient will use only water   or unsweetened tea for hydration. B.  The need to stay away from risky substances including alcohol, smoking; obtaining 7 to 9 hours of restorative sleep, at least 150 minutes of moderate intensity exercise weekly, the importance of healthy social connections,  and stress management techniques were discussed. C.  A full color page of  Calorie density of various food groups per pound showing examples of each food groups was provided to the patient.  - I encouraged him to switch to  unprocessed or minimally processed complex starch and increased protein intake (animal or plant source), fruits, and vegetables.  - he is advised to stick to a routine mealtimes to eat 3 meals  a day and avoid unnecessary snacks ( to snack only to correct hypoglycemia).   - he has been  scheduled with Penny Crumpton, RDN, CDE for individualized DM education- consult pending.  - I have approached him with the following individualized plan to manage diabetes and patient agrees:   -In light of his presentation with significant improvement in his glycemic profile, he will not need prandial insulin  for now.  His insurance did not provide coverage for Mounjaro  and apparently did not give him any options.  I will send a trial of Ozempic  0.25 mg subcutaneously weekly.  Side effects and precautions discussed with him.  This medication will be advised if he gets coverage and if he tolerates.    In the meantime, he is advised to continue Toujeo  100 units nightly, continue Farxiga  10 mg p.o. daily at breakfast along with metformin  500 mg ER p.o. daily at breakfast.    He is encouraged to continue to use his CGM, report hypoglycemia below 70 or hyperglycemia above 200 mg per DL weekly average.   2) BP/HTN: - His blood pressure is controlled to target.  He is advised to continue  lisinopril  to 20 mg p.o. daily at breakfast.    3) Lipids/HPL: His LDL is increasing to 90.   He  did not stay engaged with lifestyle medicine nutrition.  He is advised to continue atorvastatin  40 mg nightly.    He will have fasting lipid panel before his next visit.     4)  Weight/Diet: His current BMI is 44.21 kg/m- He is  a candidate for modest weight loss. - He did not get the traction from bariatric surgery, advised to maximize his personal efforts first. I discussed with him the fact that he would benefit the most from loss of 5 - 10% of his current body weight .  He remains a good candidate for lifestyle medicine nutrition as discussed above.    5) vitamin D  deficiency:  He is status post treatment with vitamin D2 50,000 units weekly for several weeks.  He is advised to maintain with vitamin D3 5000 units daily.     6) Chronic Care/Health Maintenance:  -he  is on ACEI/ARB and Statin medications and  is encouraged to continue to follow up with Ophthalmology, Dentist,  Podiatrist at least yearly or according to recommendations, and advised to  stay away from smoking. I have recommended yearly flu vaccine and pneumonia vaccination at least every 5 years; moderate intensity exercise for up to 150 minutes weekly; and  sleep for at least 7 hours a day.  He had negative ABI for PAD in April 2022.  His study will be repeated in April 2027, or sooner if needed.   - I advised patient to maintain close follow up with Marvine Rush, MD for primary care  needs.   I spent  43 minutes in the care of the patient today including review of labs from CMP, Lipids, Thyroid  Function, Hematology (current and previous including abstractions from other facilities); face-to-face time discussing  his blood glucose readings/logs, discussing hypoglycemia and hyperglycemia episodes and symptoms, medications doses, his options of short and long term treatment based on the latest standards of care / guidelines;  discussion about incorporating lifestyle medicine;  and documenting the encounter. Risk reduction  counseling performed per USPSTF guidelines to reduce  obesity and cardiovascular risk factors.     Please refer to Patient Instructions for Blood Glucose Monitoring and Insulin /Medications Dosing Guide  in media tab for additional information. Please  also refer to  Patient Self Inventory in the Media  tab for reviewed elements of pertinent patient history.  Joel Ward participated in the discussions, expressed understanding, and voiced agreement with the above plans.  All questions were answered to his satisfaction. he is encouraged to contact clinic should he have any questions or concerns prior to his return visit.     Follow up plan: - Return in about 4 months (around 09/16/2024) for F/U with Pre-visit Labs, Meter/CGM/Logs, A1c here.  Joel Earl, MD Phone: [phone]  Fax: [phone]   05/18/2024, 11:55 AM This note was partially dictated with voice recognition software. Similar sounding words can be transcribed inadequately or may not  be corrected upon review.

## 2024-05-18 NOTE — Patient Instructions (Signed)

## 2024-05-26 ENCOUNTER — Other Ambulatory Visit (HOSPITAL_COMMUNITY): Payer: Self-pay

## 2024-05-26 ENCOUNTER — Other Ambulatory Visit: Payer: Self-pay | Admitting: "Endocrinology

## 2024-05-26 DIAGNOSIS — E1159 Type 2 diabetes mellitus with other circulatory complications: Secondary | ICD-10-CM

## 2024-05-30 ENCOUNTER — Other Ambulatory Visit (HOSPITAL_COMMUNITY): Payer: Self-pay

## 2024-05-30 ENCOUNTER — Telehealth: Payer: Self-pay | Admitting: Pharmacy Technician

## 2024-05-30 NOTE — Telephone Encounter (Signed)
 Pharmacy Patient Advocate Encounter   Received notification from Onbase that prior authorization for Ozempic  (0.25 or 0.5 MG/DOSE) 2MG /3ML pen-injectors  is required/requested.   Insurance verification completed.   The patient is insured through [facility].   Per test claim: PA required; PA submitted to above mentioned insurance via Latent Key/confirmation #/EOC [id number] Status is pending

## 2024-05-31 ENCOUNTER — Other Ambulatory Visit (HOSPITAL_COMMUNITY): Payer: Self-pay

## 2024-05-31 NOTE — Telephone Encounter (Signed)
 Pharmacy Patient Advocate Encounter  Received notification from [employer] that Prior Authorization for Ozempic  (0.25 or 0.5 MG/DOSE) 2MG /3ML pen-injectors  has been CANCELLED due to    PA #/Case ID/Reference #: [id number]   **PA form filled out and faxed to [employer] with chart notes. PA is pending.** Fax #: [phone]

## 2024-06-02 NOTE — Telephone Encounter (Signed)
 Additional information has been requested from the patient's insurance in order to proceed with the prior authorization request.   **Please advise. Has this patient tried Metformin  100mg  bid?**

## 2024-06-03 ENCOUNTER — Other Ambulatory Visit: Payer: Self-pay | Admitting: "Endocrinology

## 2024-06-03 DIAGNOSIS — E1159 Type 2 diabetes mellitus with other circulatory complications: Secondary | ICD-10-CM

## 2024-06-07 NOTE — Telephone Encounter (Signed)
 I only see the Metformin  1000mg  daily starting 03/27/22 and then it was reduced to 500mg  daily 04/2023.

## 2024-06-08 NOTE — Telephone Encounter (Signed)
 The insurance is wanting him to have tried Metformin  1000mg  bid unless there is clinical rationale as to why he can't take it. They are wanting me to fax that back that information or they will deny the PA for the Ozempic .

## 2024-06-09 NOTE — Telephone Encounter (Signed)
 Additional information has been requested from the patient's insurance in order to proceed with the prior authorization request. Requested information has been sent, or form has been filled out and faxed back to [phone]

## 2024-06-09 NOTE — Telephone Encounter (Signed)
 I will use this information and fax it back to the insurance. Thank you!

## 2024-06-12 ENCOUNTER — Other Ambulatory Visit: Payer: Self-pay | Admitting: "Endocrinology

## 2024-06-12 MED ORDER — METFORMIN HCL 1000 MG PO TABS: 1000.0000 mg | ORAL_TABLET | Freq: Two times a day (BID) | ORAL | 1 refills | Status: AC

## 2024-06-12 NOTE — Telephone Encounter (Signed)
 Insurance denied pt's Ozempic  due to pt not taking Metformin  1000mg  bid for a trial period.

## 2024-06-12 NOTE — Telephone Encounter (Signed)
 Spoke with pt, he stated he is willing to try the regular Metformin  1000mg  bid.

## 2024-06-12 NOTE — Telephone Encounter (Signed)
 Pharmacy Patient Advocate Encounter  Received notification from [employer] that Prior Authorization for Ozempic  (0.25 or 0.5 MG/DOSE) 2MG /3ML pen-injectors  has been DENIED.  Full denial letter will be uploaded to the media tab. See denial reason below.   PA #/Case ID/Reference #: [id number]

## 2024-06-13 ENCOUNTER — Other Ambulatory Visit: Payer: Self-pay | Admitting: Cardiology

## 2024-06-27 ENCOUNTER — Other Ambulatory Visit: Payer: Self-pay | Admitting: "Endocrinology

## 2024-06-27 DIAGNOSIS — E1159 Type 2 diabetes mellitus with other circulatory complications: Secondary | ICD-10-CM

## 2024-09-19 ENCOUNTER — Ambulatory Visit: Admitting: "Endocrinology
